# Patient Record
Sex: Female | Born: 1935
Health system: Southern US, Community
[De-identification: ages and names within clinical notes are randomized; demographics above are authoritative.]

## PROBLEM LIST (undated history)

## (undated) DIAGNOSIS — IMO0001 Reserved for inherently not codable concepts without codable children: Secondary | ICD-10-CM

## (undated) DIAGNOSIS — I10 Essential (primary) hypertension: Secondary | ICD-10-CM

## (undated) DIAGNOSIS — I2510 Atherosclerotic heart disease of native coronary artery without angina pectoris: Secondary | ICD-10-CM

## (undated) DIAGNOSIS — I739 Peripheral vascular disease, unspecified: Secondary | ICD-10-CM

## (undated) DIAGNOSIS — I6529 Occlusion and stenosis of unspecified carotid artery: Secondary | ICD-10-CM

## (undated) DIAGNOSIS — R05 Cough: Secondary | ICD-10-CM

## (undated) DIAGNOSIS — K5792 Diverticulitis of intestine, part unspecified, without perforation or abscess without bleeding: Secondary | ICD-10-CM

## (undated) DIAGNOSIS — R053 Chronic cough: Secondary | ICD-10-CM

## (undated) DIAGNOSIS — E079 Disorder of thyroid, unspecified: Secondary | ICD-10-CM

## (undated) DIAGNOSIS — M1990 Unspecified osteoarthritis, unspecified site: Secondary | ICD-10-CM

## (undated) DIAGNOSIS — E7800 Pure hypercholesterolemia, unspecified: Secondary | ICD-10-CM

## (undated) HISTORY — PX: CORONARY ARTERY BYPASS GRAFT: SHX141

## (undated) HISTORY — PX: ABDOMINAL HYSTERECTOMY: SHX81

## (undated) HISTORY — PX: COLONOSCOPY: SHX174

## (undated) HISTORY — PX: APPENDECTOMY: SHX54

## (undated) HISTORY — PX: EYE SURGERY: SHX253

---

## 1998-10-20 ENCOUNTER — Other Ambulatory Visit: Admission: RE | Admit: 1998-10-20 | Discharge: 1998-10-20 | Payer: Self-pay | Admitting: Family Medicine

## 1999-01-19 ENCOUNTER — Ambulatory Visit (HOSPITAL_COMMUNITY): Admission: RE | Admit: 1999-01-19 | Discharge: 1999-01-19 | Payer: Self-pay | Admitting: Gastroenterology

## 1999-02-09 ENCOUNTER — Encounter: Admission: RE | Admit: 1999-02-09 | Discharge: 1999-02-09 | Payer: Self-pay | Admitting: Family Medicine

## 1999-02-09 ENCOUNTER — Encounter: Payer: Self-pay | Admitting: Family Medicine

## 2000-02-16 ENCOUNTER — Encounter: Admission: RE | Admit: 2000-02-16 | Discharge: 2000-02-16 | Payer: Self-pay | Admitting: Family Medicine

## 2000-02-16 ENCOUNTER — Encounter: Payer: Self-pay | Admitting: Family Medicine

## 2000-07-19 ENCOUNTER — Encounter: Payer: Self-pay | Admitting: Cardiology

## 2000-07-19 ENCOUNTER — Ambulatory Visit (HOSPITAL_COMMUNITY): Admission: RE | Admit: 2000-07-19 | Discharge: 2000-07-19 | Payer: Self-pay | Admitting: Cardiology

## 2000-07-26 ENCOUNTER — Ambulatory Visit (HOSPITAL_COMMUNITY): Admission: RE | Admit: 2000-07-26 | Discharge: 2000-07-26 | Payer: Self-pay | Admitting: Cardiology

## 2000-08-06 ENCOUNTER — Encounter: Admission: RE | Admit: 2000-08-06 | Discharge: 2000-08-06 | Payer: Self-pay | Admitting: Family Medicine

## 2000-08-06 ENCOUNTER — Encounter: Payer: Self-pay | Admitting: Family Medicine

## 2000-08-12 ENCOUNTER — Encounter: Admission: RE | Admit: 2000-08-12 | Discharge: 2000-08-12 | Payer: Self-pay | Admitting: Family Medicine

## 2000-08-12 ENCOUNTER — Encounter: Payer: Self-pay | Admitting: Family Medicine

## 2001-02-18 ENCOUNTER — Encounter: Admission: RE | Admit: 2001-02-18 | Discharge: 2001-02-18 | Payer: Self-pay | Admitting: Family Medicine

## 2001-02-18 ENCOUNTER — Encounter: Payer: Self-pay | Admitting: Family Medicine

## 2002-02-24 ENCOUNTER — Ambulatory Visit (HOSPITAL_COMMUNITY): Admission: RE | Admit: 2002-02-24 | Discharge: 2002-02-24 | Payer: Self-pay | Admitting: Gastroenterology

## 2002-02-26 ENCOUNTER — Encounter: Payer: Self-pay | Admitting: Family Medicine

## 2002-02-26 ENCOUNTER — Encounter: Admission: RE | Admit: 2002-02-26 | Discharge: 2002-02-26 | Payer: Self-pay | Admitting: Family Medicine

## 2003-01-05 ENCOUNTER — Emergency Department (HOSPITAL_COMMUNITY): Admission: AD | Admit: 2003-01-05 | Discharge: 2003-01-05 | Payer: Self-pay | Admitting: Family Medicine

## 2003-04-28 ENCOUNTER — Encounter: Admission: RE | Admit: 2003-04-28 | Discharge: 2003-04-28 | Payer: Self-pay | Admitting: Family Medicine

## 2003-07-15 ENCOUNTER — Emergency Department (HOSPITAL_COMMUNITY): Admission: EM | Admit: 2003-07-15 | Discharge: 2003-07-15 | Payer: Self-pay | Admitting: Family Medicine

## 2004-05-16 ENCOUNTER — Encounter: Admission: RE | Admit: 2004-05-16 | Discharge: 2004-05-16 | Payer: Self-pay | Admitting: Family Medicine

## 2004-10-23 ENCOUNTER — Encounter: Admission: RE | Admit: 2004-10-23 | Discharge: 2004-10-23 | Payer: Self-pay | Admitting: Internal Medicine

## 2005-01-02 ENCOUNTER — Ambulatory Visit: Payer: Self-pay | Admitting: Internal Medicine

## 2005-01-03 ENCOUNTER — Ambulatory Visit (HOSPITAL_COMMUNITY): Admission: RE | Admit: 2005-01-03 | Discharge: 2005-01-03 | Payer: Self-pay | Admitting: Internal Medicine

## 2005-01-15 ENCOUNTER — Ambulatory Visit (HOSPITAL_COMMUNITY): Admission: RE | Admit: 2005-01-15 | Discharge: 2005-01-15 | Payer: Self-pay | Admitting: Internal Medicine

## 2005-01-18 ENCOUNTER — Ambulatory Visit (HOSPITAL_COMMUNITY): Admission: RE | Admit: 2005-01-18 | Discharge: 2005-01-18 | Payer: Self-pay | Admitting: Internal Medicine

## 2005-02-01 ENCOUNTER — Ambulatory Visit: Payer: Self-pay | Admitting: Internal Medicine

## 2005-03-09 ENCOUNTER — Ambulatory Visit: Payer: Self-pay | Admitting: Internal Medicine

## 2005-06-04 ENCOUNTER — Encounter: Admission: RE | Admit: 2005-06-04 | Discharge: 2005-06-04 | Payer: Self-pay | Admitting: Internal Medicine

## 2005-06-14 ENCOUNTER — Inpatient Hospital Stay (HOSPITAL_COMMUNITY): Admission: EM | Admit: 2005-06-14 | Discharge: 2005-06-25 | Payer: Self-pay | Admitting: Emergency Medicine

## 2005-06-15 ENCOUNTER — Encounter: Payer: Self-pay | Admitting: Vascular Surgery

## 2005-06-18 ENCOUNTER — Encounter (INDEPENDENT_AMBULATORY_CARE_PROVIDER_SITE_OTHER): Payer: Self-pay | Admitting: *Deleted

## 2005-07-06 ENCOUNTER — Encounter: Admission: RE | Admit: 2005-07-06 | Discharge: 2005-07-06 | Payer: Self-pay | Admitting: Cardiothoracic Surgery

## 2005-07-19 ENCOUNTER — Encounter (HOSPITAL_COMMUNITY): Admission: RE | Admit: 2005-07-19 | Discharge: 2005-10-17 | Payer: Self-pay | Admitting: Cardiology

## 2005-07-20 ENCOUNTER — Encounter: Admission: RE | Admit: 2005-07-20 | Discharge: 2005-07-20 | Payer: Self-pay | Admitting: Cardiothoracic Surgery

## 2005-08-16 ENCOUNTER — Encounter: Admission: RE | Admit: 2005-08-16 | Discharge: 2005-08-16 | Payer: Self-pay | Admitting: Cardiothoracic Surgery

## 2005-10-18 ENCOUNTER — Encounter (HOSPITAL_COMMUNITY): Admission: RE | Admit: 2005-10-18 | Discharge: 2005-11-02 | Payer: Self-pay | Admitting: Cardiology

## 2006-02-21 ENCOUNTER — Encounter (HOSPITAL_COMMUNITY): Admission: RE | Admit: 2006-02-21 | Discharge: 2006-05-22 | Payer: Self-pay | Admitting: Cardiology

## 2006-06-06 ENCOUNTER — Encounter (HOSPITAL_COMMUNITY): Admission: RE | Admit: 2006-06-06 | Discharge: 2006-09-04 | Payer: Self-pay | Admitting: Cardiology

## 2006-06-06 ENCOUNTER — Encounter: Admission: RE | Admit: 2006-06-06 | Discharge: 2006-06-06 | Payer: Self-pay | Admitting: Internal Medicine

## 2006-09-03 ENCOUNTER — Ambulatory Visit: Payer: Self-pay | Admitting: Internal Medicine

## 2006-09-06 ENCOUNTER — Encounter (HOSPITAL_COMMUNITY): Admission: RE | Admit: 2006-09-06 | Discharge: 2006-12-05 | Payer: Self-pay | Admitting: Cardiology

## 2006-12-07 ENCOUNTER — Encounter (HOSPITAL_COMMUNITY): Admission: RE | Admit: 2006-12-07 | Discharge: 2007-02-04 | Payer: Self-pay | Admitting: Cardiology

## 2007-02-06 ENCOUNTER — Encounter (HOSPITAL_COMMUNITY): Admission: RE | Admit: 2007-02-06 | Discharge: 2007-04-08 | Payer: Self-pay | Admitting: Cardiology

## 2007-06-10 ENCOUNTER — Encounter: Admission: RE | Admit: 2007-06-10 | Discharge: 2007-06-10 | Payer: Self-pay | Admitting: Internal Medicine

## 2008-06-10 ENCOUNTER — Encounter: Admission: RE | Admit: 2008-06-10 | Discharge: 2008-06-10 | Payer: Self-pay | Admitting: Internal Medicine

## 2009-06-13 ENCOUNTER — Encounter: Admission: RE | Admit: 2009-06-13 | Discharge: 2009-06-13 | Payer: Self-pay | Admitting: Internal Medicine

## 2010-02-26 ENCOUNTER — Encounter: Payer: Self-pay | Admitting: Internal Medicine

## 2010-05-12 ENCOUNTER — Other Ambulatory Visit: Payer: Self-pay | Admitting: Internal Medicine

## 2010-05-12 DIAGNOSIS — Z1231 Encounter for screening mammogram for malignant neoplasm of breast: Secondary | ICD-10-CM

## 2010-06-06 ENCOUNTER — Other Ambulatory Visit: Payer: Self-pay | Admitting: Gastroenterology

## 2010-06-15 ENCOUNTER — Ambulatory Visit
Admission: RE | Admit: 2010-06-15 | Discharge: 2010-06-15 | Disposition: A | Discharge status: Home / Self Care | Payer: Medicare Other | Source: Ambulatory Visit | Attending: Internal Medicine | Admitting: Internal Medicine

## 2010-06-15 DIAGNOSIS — Z1231 Encounter for screening mammogram for malignant neoplasm of breast: Secondary | ICD-10-CM

## 2010-06-20 NOTE — Assessment & Plan Note (Signed)
[facility]                             PULMONARY OFFICE NOTE   NAME:[name], [name]                   MRN:          [id number]  DATE:09/01/2006                            DOB:          [birth year]    PROBLEM LIST:  1. Chronic cough.  2. Asthmatic bronchitis.  3. Esophageal reflux.  4. Coronary disease.  5. Bypass.   HISTORY:  She is still going to rehab three times per week. Daily  morning cough productive of white phlegm for about 20 minutes. She said  she had this cough long before she started her Altace and it does not  sound, from that, as if it is likely to be an ACE inhibitor cough.  Barium swallow demonstrated pill dysphagia without reflux. She has a  history of a positive methacholine study for hyperreactive airways.  Spiriva had not helped her.   CURRENT MEDICATIONS:  1. Caduet 10/80.  2. Toprol XL 50 mg.  3. Zetia 10 mg.  4. Evista 60 mg.  5. Altace 10 mg.  6. Aspirin 81 mg.  7. Caltrate.  8. Multivitamins.   ALLERGIES:  None.   OBJECTIVE:  Weight 171, BP 128/58, pulse 79, room air saturation 92%.  Slight cough with deep inspiration, no real wheeze or rhonchi. No rales.  No neck vein distention or stridor. No visible drainage. Heart sounds  regular without murmur or gallop. No edema.   Pulmonary function tests have been done in December, 2006 demonstrating  mild obstructive disease of the small airway level and some response to  bronchodilators. Lung volumes are normal. Diffusion was mildly reduced  at that time. On a six minute walk test, she maintained oxygenation  walking 1300 feet.   IMPRESSION:  1. Cough, very likely is related to her swallowing low grade      aspiration and probably occasional reflux and also related to some      mildly reactive airway disease. She doesn't seem troubled enough to      feel it is worth while to change her ACE inhibitor based on her      history.   PLAN:  1. We are going to have  her try Symbicort 160/4.5 two puffs b.i.d.      with instructions.  2. Schedule to return in three months, earlier prn.     Clinton D. Maple Hudson, MD, Tonny Bollman, FACP  Electronically Signed    CDY/MedQ  DD: 09/03/2006  DT: 09/04/2006  Job #: [id number]

## 2010-06-23 NOTE — Consult Note (Signed)
[name], [name]            ACCOUNT NO.:  [id number]   MEDICAL RECORD NO.:  [id number]          PATIENT TYPE:  INP   LOCATION:  2006                         FACILITY:  [facility]   PHYSICIAN:  Kerin Perna, M.D.  DATE OF BIRTH:  04/17/1933   DATE OF CONSULTATION:  06/15/2005  DATE OF DISCHARGE:                                   CONSULTATION   REASON FOR CONSULTATION:  Left main and three vessel coronary artery disease  with unstable angina.   CHIEF COMPLAINT:  Chest pain.   HISTORY OF PRESENT ILLNESS:  I was asked to evaluate this 77 year old obese  white female with hypertension for potential surgical coronary  revascularization for recently diagnosed severe three vessel coronary artery  disease with left main stenosis.  The patient has a history of coronary  artery disease and had a heart cath 10-12 years ago which demonstrated an  occluded right coronary.  She was treated medically and had no significant  LV dysfunction.  She did well until recently when she developed symptoms of  exertional and depressing chest pain and exertional shortness of breath.  She was having some radiation of this pain to her shoulders with associated  nausea and light headedness, and presented for evaluation.  Her EKG showed  some mild ST segment depression and her cardiac enzymes were negative.  She  was admitted, placed on heparin and nitroglycerin, and underwent urgent  cardiac catheterization by Dr. Jenne Campus.  This demonstrated a chronically  occluded right coronary artery and an ostial left main stenosis of 70% with  calcification.  Left ventricular end diastolic pressure was 19 and her  ejection fraction was normal.  There was no evidence of mitral regurgitation  or aortic stenosis.  Because of her coronary anatomy and symptoms, she was  admitted and felt to be a candidate for surgical revascularization of her  coronary artery disease.  The patient is currently stable on IV heparin and  not  having chest pain.   PAST MEDICAL HISTORY:  1.  Hypertension.  2.  Asthma and COPD.  3.  Hyperlipidemia.  4.  Bilateral carotid bruits with a right ICA stenosis of 60%.  5.  Recent onset of eczema of her left arm and leg treated with prednisone      taper.  6.  Three weeks of diarrhea, history of diverticular disease of the colon.  7.  Surgical history positive for total abdominal hysterectomy.  8.  No known drug allergies.   CURRENT MEDICATIONS:  E-Vista 60 mg daily, Imdur 120 mg daily, Caltrate D  one p.o. daily, aspirin 325 daily, multi-vitamin one daily, Toprol XL 50 mg  daily, Zetia 10 mg daily, Caduet 10/80 mg daily, and a prednisone pack.   ALLERGIES:  None.   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  She is married and has children.  She never smoked or used alcohol.   FAMILY HISTORY:  No history of premature coronary artery disease or  diabetes.   REVIEW OF SYMPTOMS:  Constitutional review is negative for fever or weight  loss.  ENT  review is negative for headache, change in vision, active dental  disease, or difficulty swallowing.  She was evaluated with an esophagogram  for GERD and that was negative.  Cardiac review is positive for coronary  artery disease and unstable angina with three vessel disease and unstable  angina.  Pulmonary review is positive for chronic cough for ten years  evaluated by multiple pulmonary specialists of unclear etiology.  This is a  non-productive cough.  GI review is positive for her pre-disposition to  diarrhea after colonoscopy.  She has had polypectomy by Dr. Carman Ching.  She does have notation of diverticular disease of the colon in her medical  record.  She states one out of four recent stool specimens were positive for  blood, mild.  She does have a history of hemorrhoid disease.  Urologic  review is negative for kidney stones or UTI.  Vascular review is negative  for claudication, TIA, or DVT.   Her Doppler carotid exams shows a 60%  moderate right carotid stenosis.  Neurologic review is negative for stroke  or seizure.  Hematologic review is negative for bleeding disorder or blood  transfusions.  Endocrine Review is negative for diabetes or thyroid disease.  Skin review is positive for a recent rash of the arm and leg which appeared  to be eczema which responded to a prednisone taper.  She does have a history  of Herpes zoster, as well, according to her history.   PHYSICAL EXAMINATION:  VITAL SIGNS:  The patient is 5 feet 8 inches, weight 174 pounds, blood  pressure 130/80, pulse 80, respirations 18, saturation 96% on room air.  GENERAL:  Short, obese white female in no acute distress.  HEENT:  Normocephalic, dentition adequate.  NECK:  Bilateral carotid bruits but no masses or JVD.  LYMPHATICS:  Reveal no palpable supraclavicular or axillary adenopathy.  LUNGS:  Clear.  CARDIAC EXAM:  Regular rhythm without S3 gallop, murmur, or rub.  ABDOMEN:  Soft without organomegaly or focal tenderness.  There were no  abdominal bruits present.  EXTREMITIES:  Trace edema, no clubbing or cyanosis.  VASCULAR EXAM:  2+ pulses in the radial and femoral areas, not palpable in  the pedal areas bilaterally.  SKIN:  Raised, red dry rash on the left arm and left thigh.  NEUROLOGIC:  Alert and oriented x 3 without focal motor deficit.   IMPRESSION:  I reviewed the coronary arteriograms and her hospital record.  She has significant three vessel coronary artery disease with preserved LV  systolic function.  The patient would benefit from surgical  revascularization with mammary artery graft to the LAD and vein grafts to  the right coronary, posterior descending, and circumflex marginal.  I have  discussed the procedure in detail with the patient and family and they  understand  the indications, associated risks, as well as the alternatives to surgery. The surgery will be tentatively scheduled for  Tuesday, May 15.   Thank you for the consultation.      Kerin Perna, M.D.  Electronically Signed     PV/MEDQ  D:  06/15/2005  T:  06/15/2005  Job:  [id number]   cc:   Darlin Priestly, MD  Fax: [phone]   Cristy Hilts. Jacinto Halim, MD  Fax: [phone]   Olene Craven, M.D.  Fax: [phone]

## 2010-06-23 NOTE — Op Note (Signed)
   NAME:  [name], [name]                      ACCOUNT NO.:  [id number]   MEDICAL RECORD NO.:  [id number]                   PATIENT TYPE:  AMB   LOCATION:  ENDO                                 FACILITY:  [facility]   PHYSICIAN:  James L. Malon Kindle., M.D.          DATE OF BIRTH:  [birth year]   DATE OF PROCEDURE:  02/24/2002  DATE OF DISCHARGE:                                 OPERATIVE REPORT   PROCEDURE:  Colonoscopy.   MEDICATIONS:  1. Fentanyl 100 mcg.  2. Versed 10 mg I.V.   INDICATIONS:  The patient has had a previous history of polyps removed.  This is done as a follow-up.   DESCRIPTION OF PROCEDURE:  The procedure had been explained to the patient  and consent obtained.  The patient was placed in the left lateral decubitus  position.  The Olympus pediatric scope was inserted and advanced.  The prep  was good.  She had a long, tortuous colon.  Multiple maneuvers were  required.  We eventually were able to advance to the level of the ileocecal  valve.  The ileocecal valve did appear to be lipomatous.  The tip of the  cecum could be seen in the distance.  The scope was withdrawn and the  ascending colon, transverse colon, splenic flexure, descending and sigmoid  colon were seen well.  Marked diverticular disease in the sigmoid and left  colon.  No polyps were seen.  Scope was withdrawn back into the rectum.  There were no polyps seen in the rectum.  There were internal hemorrhoids  and a small amount of bright blood at the anal canal.  The scope was  withdrawn.  The patient tolerated the procedure well, was monitored  throughout, and given O2 by nasal cannula.                                               James L. Malon Kindle., M.D.    Waldron Session  D:  02/24/2002  T:  02/24/2002  Job:  [id number]   cc:   L. Lupe Carney, M.D.  [address] [place]  [place] [address]  Fax: [phone]

## 2010-06-23 NOTE — Discharge Summary (Signed)
NAMECRISSA, SOWDER            ACCOUNT NO.:  [id number]   MEDICAL RECORD NO.:  [id number]          PATIENT TYPE:  INP   LOCATION:  2021                         FACILITY:  [facility]   PHYSICIAN:  Kerin Perna, M.D.  DATE OF BIRTH:  Mar 10, 1933   DATE OF ADMISSION:  06/14/2005  DATE OF DISCHARGE:  06/25/2005                                 DISCHARGE SUMMARY   PRIMARY ADMITTING DIAGNOSIS:  Chest pain   ADDITIONAL/DISCHARGE DIAGNOSES:  1.  Severe three-vessel coronary artery disease.  2.  Unstable angina.  3.  Hypertension.  4.  Asthma/chronic obstructive pulmonary disease.  5.  Hyperlipidemia.  6.  Extracranial cerebrovascular occlusive disease with a 60-80% right      internal carotid artery stenosis.  7.  History of diverticular disease.  8.  Postoperative blood loss anemia.   PROCEDURES PERFORMED:  1.  Cardiac catheterization.  2.  Coronary artery bypass grafting x3 (left internal mammary artery to the      LAD, saphenous vein graft to the obtuse marginal, saphenous vein graft      to posterior descending.  3.  Endoscopic vein harvest left leg.   HISTORY:  The patient is a 77 year old white female with a known history of  coronary artery disease.  She presented to the [facility] on the date of this admission complaining of chest pain  which had been occurring intermittently since the previous night.  It was  felt that she should be admitted and undergo cardiac catheterization to  further delineate her anatomy at this time.   HOSPITAL COURSE:  She was admitted and started on IV nitroglycerin and  heparin.  She underwent cardiac catheterization which showed a new ostial  left main stenosis along with chronic occlusion of the right coronary  artery, left ventricular systolic function was preserved.  Her cardiac  enzymes were negative.  She was not felt to be a good candidate for  percutaneous intervention.  A cardiothoracic surgery consultation  was  obtained and the patient was seen by Dr. Kathlee Nations Trigt who agreed that her  best course of action would be to proceed with surgical revascularization.  He explained the risks, benefits and alternatives of the procedure to the  patient and she agreed to proceed with surgery.  She underwent a complete  preoperative workup which included carotid Doppler study showing a 60 to 80%  right internal carotid artery stenosis and lower extremity Dopplers which  showed slightly diminished ABIs bilaterally at 0.77 on the right and 0.83 on  the left.  She remained stable and pain-free prior to surgery.  She was  taken to the operating room on Jun 19, 2005, and underwent CABG x3 as  described in detail above.  She tolerated the procedure well and was  transferred to the SICU in stable condition.  She was able to be extubated  shortly after surgery.   She was hemodynamically stable and doing well on postop day #1.  At that  time, her chest tubes and hemodynamic monitoring devices were removed.  She  was mobilized with the assistance of  cardiac rehab phase one.   By postop day #2, she was doing well and able to be transferred to the  floor.  Postoperatively, she has been volume overloaded and has been  diuresed.  She also has had a mild acute blood loss anemia and has been  treated with iron and folic acid supplements and is improving.  She has been  slightly tachycardiac and has been started on a beta blocker which has been  titrated upward.  Also her blood pressure started to trend upward and she  was started on an ACE inhibitor.  Her pulmonary status has remained stable.  She has been treated with aggressive pulmonary toilet measures, metered dose  inhaler and incentive spirometry.  She is tolerating a regular diet and is  having normal bowel and bladder function.  She has been ambulating in the  halls without difficulty.  She is afebrile and her vital signs are stable.  She is maintaining O2  sats of greater than 90% on room air.  She is still  approximately 8 pounds above her preoperative weight and still exhibit signs  of lower extremity edema on physical exam.   Her most recent labs show hemoglobin of 8.6, hematocrit 25.3, white count  11.4, platelets 176.  Sodium 138, potassium 4.2, BUN 13, creatinine 0.8.   Her incisions are all healing well.  It is felt that if she continues to  remain stable over the next 24 hours and her labs to be drawn on Jun 25, 2005, specifically CBC and a BMET, are stable and she is otherwise doing  well, she will hopefully be ready for discharge home at that time.   DISCHARGE MEDICATIONS:  1.  Aspirin 81 mg daily.  2.  Lopressor 50 mg b.i.d.  3.  Altace 5 mg daily.  4.  Caduet 10/80 daily.  5.  Lasix 40 mg daily x5 days.  6.  K-Dur 20 mEq daily x5 days.  7.  Folic acid 1 mg daily.  8.  Nu-Iron 150 mg daily.  9.  Advair 250/50 1 puff b.i.d.  10. Evista 60 mg daily.  11. Multivitamin daily.  12. Calcium daily.  13. Zetia 10 mg daily.  14. Oxycodone 5 mg one to two q.4h. p.r.n. for pain.   DISCHARGE INSTRUCTIONS:  She is asked to refrain from driving, heavy lifting  or strenuous activity.  She may continue ambulating daily and using her  incentive spirometer.  She may shower daily and clean her incisions with  soap and water.  She will continue low-fat, low-sodium diet.   DISCHARGE FOLLOWUP:  She will need to make an appointment see Dr. Jacinto Halim back  in two weeks and will call for an appointment.  She will see Dr. Donata Clay  the following week and will have a chest x-ray at [facility]  prior to this appointment.  She is asked to contact our office immediately  if she experiences any problems or has questions.      Coral Ceo, P.A.      Kerin Perna, M.D.  Electronically Signed    GC/MEDQ  D:  06/24/2005  T:  06/25/2005  Job:  [id number]  cc:   Olene Craven, M.D.  Fax: [phone]   Cristy Hilts. Jacinto Halim, MD  Fax:  [phone]

## 2010-06-23 NOTE — Op Note (Signed)
[facility]  Patient:    [name]                    MRN: [id number] Proc. Date: 01/19/99 Adm. Date:  [id number] Disc. Date: 01/19/99 Attending:  Orland Mustard CC:         Abran Cantor. Clovis Riley, MD                           Operative Report  PROCEDURE:  Colonoscopy with polypectomy.  MEDICATIONS:  Fentanyl 100 mcg, Versed 10 mg IV.  SCOPE:  Olympus adult video colonoscope.  DESCRIPTION OF PROCEDURE:  The procedure had been explained to the patient and consent obtained.  With the patient in the left lateral decubitus position the Olympus adult video colonoscope was inserted and advanced under direct visualization.  The prep was excellent.  The patient had marked diverticular disease and it took some time to pass the sigmoid colon, and position changes and abdominal pressure were required to reach cecum. We arrived at the level of ileocecal valve.  The tip of the cecum could be seen in the distance, although  could not entirely advance in to; it appeared normal.  The scope was withdrawn nto the cecum and ascending colon, hepatic flexure, transverse colon and splenic flexure were seen well.  Descending colon was also seen well and was free of neoplastic lesions.  Marked diverticular disease in the sigmoid colon.  The scope was withdrawn down into the rectum, into the proximal rectum.  At approximately  15-18 cm a .75 cm polyp was snared and sucked up against the scope and removed.  There was no bleeding.  The scope was reinserted and the scope decompressed. The patient tolerated the procedure well, maintaining a low-flow oxygen with pulse oximetry without seeing obvious problem.  ASSESSMENT: 1. Rectal polyp removed. 2. Diverticulosis.  PLAN:  Recommend repeating procedure in three years. DD:  01/19/99 TD:  01/19/99 Job: [id number]

## 2010-06-23 NOTE — Op Note (Signed)
[name], [name]            ACCOUNT NO.:  [id number]   MEDICAL RECORD NO.:  [id number]          PATIENT TYPE:  INP   LOCATION:  2310                         FACILITY:  [facility]   PHYSICIAN:  Kerin Perna, M.D.  DATE OF BIRTH:  April 15, 1933   DATE OF PROCEDURE:  06/19/2005  DATE OF DISCHARGE:                                 OPERATIVE REPORT   OPERATION:  Coronary artery bypass grafting x3 (left internal mammary artery  to the left anterior descending artery, saphenous vein graft to obtuse  marginal, saphenous vein graft to posterior descending).   PREOPERATIVE DIAGNOSIS:  Class IV unstable angina with left main and three-  vessel coronary disease.   POSTOPERATIVE DIAGNOSIS:  Class IV unstable angina with left main and three-  vessel coronary disease.   SURGEON:  Kerin Perna, M.D.   ASSISTANT:  Constance Holster, P.A.-C.   ANESTHESIA:  General by Dr. Jairo Ben.   MEDICATIONS:  The patient is a 77 year old obese female who presented with  unstable angina.  She has known coronary disease and by cardiac  catheterization 10 years ago was found to have occlusion of the right  coronary with reconstitution of the posterior descending via collaterals.  Her cardiac enzymes were negative, but cardiac catheterization demonstrated  new ostial left main stenosis along with her chronic occlusion of the right  coronary.  LV systolic function was preserved.  She was felt to be candidate  for surgical revascularization.   Prior to surgery I examined the patient in her hospital room and reviewed  the results of the cardiac catheterization with the patient and family.  I  discussed the indications and expected benefits of coronary artery bypass  surgery.  I reviewed the alternatives to surgical therapy as well.  I  discussed with the patient the major aspects of planned operation including  location of the surgical incisions, use of general anesthesia and  cardiopulmonary bypass,  the choice of conduit for grafting, and the expected  postoperative hospital recovery period.  I discussed with the patient the  direct risks to her of coronary bypass surgery including the risks of MI,  CVA, bleeding, infection, blood transfusion requirement, and death.  She  understood these implications for the surgery and agreed to proceed with the  operation as planned under what I felt was an informed consent after several  discussions with the patient.   OPERATIVE FINDINGS:  The patient's coronaries were embedded in a deep layer  of fat.  The vein was harvested endoscopically from the right leg and was of  adequate quality.  The mammary artery was a good vessel with excellent flow.  The patient was given Amicar and was not given aprotinin for this operation.  She did not receive any blood cell transfusion but did receive a unit of  platelets after reversal of the heparin with protamine due to persistent  coagulopathy.   PROCEDURE:  The patient was brought to operating room and placed supine on  the operating table and anterior general anesthesia was induced under  invasive hemodynamic monitoring.  The chest, abdomen and legs were prepped  with Betadine and draped as a sterile field.  A sternal incision was made as  the saphenous vein was harvested endoscopically from the right leg.  The  left internal mammary artery was harvested as a pedicle graft from its  origin at the subclavian vessels.  It was good vessel with excellent flow.  Heparin was administered systemically and the ACT was documented as being  therapeutic.  The sternal retractor was placed and the pericardium was  opened and suspended.  Pursestrings were placed in the ascending aorta and  right atrium and the patient was cannulated and placed on bypass.  The  coronaries were identified for grafting and the mammary artery and vein  grafts were prepared for the distal anastomoses.  Cardioplegia catheters  were placed  for both antegrade aortic and retrograde coronary sinus  cardioplegia.  The patient was cooled to 32 degrees.  The aortic crossclamp  was applied.  Cold blood cardioplegia 800 mL was delivered in split doses  between the antegrade aortic and retrograde coronary sinus catheters.  There  was a good cardioplegic arrest and septal temperature dropped to less than  12 degrees.  Topical iced saline was used to augment myocardial  preservation.   The distal coronary anastomoses were performed.  The first distal  anastomosis was to the posterior descending.  The anastomosis was right at  the takeoff of the posterior descending from the main branch of right  coronary, which had been totally occluded.  It was a 1.5 mm vessel, and a  reversed saphenous vein was sewn end-to-side with running 7-0 Prolene.  There was good flow through the graft.  The second distal anastomosis was to  the circumflex marginal.  This was a 1.5 mm vessel proximal 70% left main  stenosis.  A reversed saphenous vein was sewn end-to-side with running 7-0  Prolene, and there was excellent flow through graft.  Cardioplegia was  redosed.  The third distal anastomosis was to the mid-LAD.  This was a 1.75  mm vessel with proximal 70% left main stenosis.  The left IMA pedicle was  brought through an opening created in the left lateral pericardium and was  brought down onto the LAD and sewn end-to-side with running 8-0 Prolene.  There was excellent flow through the anastomosis after briefly releasing the  pedicle bulldog on the mammary artery.  The mammary pedicle bulldog was  reapplied and the pedicle secured to the epicardium.  Cardioplegia was  redosed.   While the crossclamp was still in place, two proximal vein anastomoses were  placed on the ascending aorta using a 4.0-mm punch and running 6-0 Prolene.  Prior to tying down the final proximal anastomosis, air was vented from the coronaries and the left side of the heart using  a dose of retrograde warm  blood cardioplegia and the usual de-airing maneuvers on bypass.  The  proximals were tied down and the crossclamp was removed.   The heart resumed a spontaneous rhythm.  The proximal and distal anastomoses  were checked and found to be hemostatic.  Air was aspirated from the vein  grafts with a 27-gauge needle.  The patient was rewarmed and reperfused.  Temporary pacing wires were applied.  When the patient reached 37 degrees,  the lungs re-expanded and the ventilator was resumed.  The patient was  weaned from bypass without difficulty.  Cardiac output, rhythm and blood  pressure were stable.  Protamine was administered without adverse reaction.  The cannulas were removed.  The mediastinum was irrigated with warm  antibiotic irrigation.  The leg incision was irrigated and closed in a  standard fashion.  Because of persistent coagulopathy, the patient was given  a unit of platelets as the platelet count on bypass was a approximately  120,000.  This improved coagulation function somewhat, and a second dose of  Amicar was given.  The superior pericardial fat was closed over the aorta  and vein grafts.  Two mediastinal and a left pleural chest tube were placed  and brought out through separate incisions.  The sternum was closed with  interrupted steel wire.  The pectoralis fascia was closed in a running #1  Vicryl.  The subcutaneous and skin layers were closed in a running Vicryl  and sterile dressings were applied.  Total bypass time was 105 minutes with  crossclamp time of 70 minutes.      Kerin Perna, M.D.  Electronically Signed     PV/MEDQ  D:  06/19/2005  T:  06/20/2005  Job:  [id number]

## 2010-06-23 NOTE — Cardiovascular Report (Signed)
[name], [name]            ACCOUNT NO.:  [id number]   MEDICAL RECORD NO.:  [id number]          PATIENT TYPE:  INP   LOCATION:  2006                         FACILITY:  [facility]   PHYSICIAN:  Darlin Priestly, MD  DATE OF BIRTH:  Aug 19, 1933   DATE OF PROCEDURE:  06/15/2005  DATE OF DISCHARGE:                              CARDIAC CATHETERIZATION   PROCEDURE:  1.  Left heart catheterization.  2.  Coronary angiography.  3.  Left ventriculogram.   ATTENDING PHYSICIAN:  Darlin Priestly, MD.   COMPLICATIONS:  None.   INDICATIONS FOR PROCEDURE:  [name] is a 77 year old female patient of  Vonna Kotyk R. Jacinto Halim, MD with a history of CAD with a chronically occluded RCA with  left to right collaterals. She was admitted on Jun 14, 2005 with complaint  of increasing chest pain with lateral EKG changes. She is now referred for  repeat catheterization to reassess her current anatomy.   DESCRIPTION OF PROCEDURE:  After informed consent, the patient was brought  to the cardiac cath lab, right and left groin shaved, prepped and draped in  the usual sterile fashion. ECG monitor established. Using the modified  Seldinger technique, a #6 French arterial sheath was inserted in the right  femoral artery. A 6 French diagnostic catheter was used to perform a  diagnostic angiography.   The left main is calcified with a 70% ostial/proximal lesion. There is  pressure damping when engaged. The patient also develops marked bradycardia  and relative hypotension with coronary injection.   The LAD is a medium to large size vessel which course _____ 1 diagonal  branch. There is mild calcification in the mid portion of the LAD; however,  there does not appear to be any significant stenosis.   The first diagonal is a medium size vessel with no evidence of disease.   The left circumflex is a medium size vessel coursing _____ 1 obtuse marginal  branch. _____ circumflex noted to have 50% in the vessel  lesion.   The first OM is a medium size vessel with no evidence of disease.   There is left to right collaterals to the distal PDA, PLA and distal RCA  with a 70% mid RCA stenotic lesion.   The RCA is totally occluded in its proximal segment.   Left ventriculogram revealed some preserved EF of 60%.   HEMODYNAMIC SYSTEM:  _____ pressure 146/72, LV pressure 140/12, LVDP of 19.   CONCLUSION:  1.  Significant left main and one-vessel coronary artery disease.  2.  Normal left ventricular systolic function.  3.  Elevated left ventricular diastolic pressure.      Darlin Priestly, MD  Electronically Signed     RHM/MEDQ  D:  06/15/2005  T:  06/16/2005  Job:  [id number]   cc:   Cristy Hilts. Jacinto Halim, MD  Fax: [phone]

## 2011-03-14 DIAGNOSIS — Z951 Presence of aortocoronary bypass graft: Secondary | ICD-10-CM | POA: Diagnosis not present

## 2011-03-14 DIAGNOSIS — I2510 Atherosclerotic heart disease of native coronary artery without angina pectoris: Secondary | ICD-10-CM | POA: Diagnosis not present

## 2011-03-14 DIAGNOSIS — E782 Mixed hyperlipidemia: Secondary | ICD-10-CM | POA: Diagnosis not present

## 2011-03-14 DIAGNOSIS — I6529 Occlusion and stenosis of unspecified carotid artery: Secondary | ICD-10-CM | POA: Diagnosis not present

## 2011-05-08 ENCOUNTER — Other Ambulatory Visit: Payer: Self-pay | Admitting: Internal Medicine

## 2011-05-08 DIAGNOSIS — Z1231 Encounter for screening mammogram for malignant neoplasm of breast: Secondary | ICD-10-CM

## 2011-05-14 DIAGNOSIS — L219 Seborrheic dermatitis, unspecified: Secondary | ICD-10-CM | POA: Diagnosis not present

## 2011-06-07 DIAGNOSIS — E7800 Pure hypercholesterolemia, unspecified: Secondary | ICD-10-CM | POA: Diagnosis not present

## 2011-06-07 DIAGNOSIS — I10 Essential (primary) hypertension: Secondary | ICD-10-CM | POA: Diagnosis not present

## 2011-06-11 ENCOUNTER — Emergency Department (HOSPITAL_COMMUNITY)
Admission: EM | Admit: 2011-06-11 | Discharge: 2011-06-12 | Disposition: A | Discharge status: Home / Self Care | Payer: Medicare Other | Attending: Emergency Medicine | Admitting: Emergency Medicine

## 2011-06-11 ENCOUNTER — Emergency Department (HOSPITAL_COMMUNITY): Payer: Medicare Other

## 2011-06-11 ENCOUNTER — Encounter (HOSPITAL_COMMUNITY): Payer: Self-pay | Admitting: Emergency Medicine

## 2011-06-11 DIAGNOSIS — E7800 Pure hypercholesterolemia, unspecified: Secondary | ICD-10-CM | POA: Diagnosis not present

## 2011-06-11 DIAGNOSIS — Y92009 Unspecified place in unspecified non-institutional (private) residence as the place of occurrence of the external cause: Secondary | ICD-10-CM | POA: Insufficient documentation

## 2011-06-11 DIAGNOSIS — S9000XA Contusion of unspecified ankle, initial encounter: Secondary | ICD-10-CM | POA: Diagnosis not present

## 2011-06-11 DIAGNOSIS — M25579 Pain in unspecified ankle and joints of unspecified foot: Secondary | ICD-10-CM | POA: Insufficient documentation

## 2011-06-11 DIAGNOSIS — I2510 Atherosclerotic heart disease of native coronary artery without angina pectoris: Secondary | ICD-10-CM | POA: Insufficient documentation

## 2011-06-11 DIAGNOSIS — M25476 Effusion, unspecified foot: Secondary | ICD-10-CM | POA: Insufficient documentation

## 2011-06-11 DIAGNOSIS — IMO0002 Reserved for concepts with insufficient information to code with codable children: Secondary | ICD-10-CM | POA: Diagnosis not present

## 2011-06-11 DIAGNOSIS — M25473 Effusion, unspecified ankle: Secondary | ICD-10-CM | POA: Diagnosis not present

## 2011-06-11 DIAGNOSIS — S82853A Displaced trimalleolar fracture of unspecified lower leg, initial encounter for closed fracture: Secondary | ICD-10-CM | POA: Insufficient documentation

## 2011-06-11 DIAGNOSIS — W010XXA Fall on same level from slipping, tripping and stumbling without subsequent striking against object, initial encounter: Secondary | ICD-10-CM | POA: Insufficient documentation

## 2011-06-11 HISTORY — DX: Pure hypercholesterolemia, unspecified: E78.00

## 2011-06-11 HISTORY — DX: Atherosclerotic heart disease of native coronary artery without angina pectoris: I25.10

## 2011-06-11 LAB — CBC
HCT: 41.6 % (ref 36.0–46.0)
Hemoglobin: 14.7 g/dL (ref 12.0–15.0)
MCH: 34.4 pg — ABNORMAL HIGH (ref 26.0–34.0)
MCHC: 35.3 g/dL (ref 30.0–36.0)
MCV: 97.4 fL (ref 78.0–100.0)
Platelets: 225 10*3/uL (ref 150–400)
RBC: 4.27 MIL/uL (ref 3.87–5.11)
RDW: 12.4 % (ref 11.5–15.5)
WBC: 10.8 10*3/uL — ABNORMAL HIGH (ref 4.0–10.5)

## 2011-06-11 LAB — DIFFERENTIAL
Basophils Absolute: 0 10*3/uL (ref 0.0–0.1)
Basophils Relative: 0 % (ref 0–1)
Eosinophils Absolute: 0.1 10*3/uL (ref 0.0–0.7)
Eosinophils Relative: 1 % (ref 0–5)
Lymphocytes Relative: 27 % (ref 12–46)
Lymphs Abs: 2.9 10*3/uL (ref 0.7–4.0)
Monocytes Absolute: 0.7 10*3/uL (ref 0.1–1.0)
Monocytes Relative: 7 % (ref 3–12)
Neutro Abs: 7.1 10*3/uL (ref 1.7–7.7)
Neutrophils Relative %: 66 % (ref 43–77)

## 2011-06-11 MED ORDER — HYDROMORPHONE HCL PF 1 MG/ML IJ SOLN
1.0000 mg | Freq: Once | INTRAMUSCULAR | Status: AC
  Administered 2011-06-11: 1 mg via INTRAVENOUS
  Filled 2011-06-11: qty 1

## 2011-06-11 MED ORDER — ONDANSETRON HCL 4 MG/2ML IJ SOLN
4.0000 mg | Freq: Once | INTRAMUSCULAR | Status: AC
  Administered 2011-06-11: 4 mg via INTRAVENOUS
  Filled 2011-06-11: qty 2

## 2011-06-11 NOTE — ED Provider Notes (Signed)
History     CSN: [id number]  Arrival date & time 06/11/11  1919   First MD Initiated Contact with Patient 06/11/11 2140      Chief Complaint  Patient presents with  . Ankle Pain    (Consider location/radiation/quality/duration/timing/severity/associated sxs/prior treatment) HPI Comments: Patient here after slipping on the bottom step and twisting her left ankle. States left ankle pain and abrasion to left elbow without deformity to the elbow. Marked deformity to left ankle with swelling and bruising noted medially and laterally. Denies numbness or tingling distal to the injury. She is unable to bear weight.  Patient is a 78 y.o. female presenting with ankle pain. The history is provided by the patient. No language interpreter was used.  Ankle Pain  The incident occurred 1 to 2 hours ago. The incident occurred at home. The injury mechanism was a fall and torsion. The pain is present in the left ankle. The quality of the pain is described as throbbing. The pain is at a severity of 9/10. The pain is severe. The pain has been constant since onset. Associated symptoms include inability to bear weight and loss of motion. Pertinent negatives include no numbness, no muscle weakness, no loss of sensation and no tingling. She reports no foreign bodies present. The symptoms are aggravated by bearing weight. She has tried ice for the symptoms. The treatment provided no relief.    Past Medical History  Diagnosis Date  . Coronary artery disease   . Hypercholesteremia     Past Surgical History  Procedure Date  . Coronary artery bypass graft     No family history on file.  History  Substance Use Topics  . Smoking status: Never Smoker   . Smokeless tobacco: Not on file  . Alcohol Use: No    OB History    Grav Para Term Preterm Abortions TAB SAB Ect Mult Living                  Review of Systems  Musculoskeletal: Positive for joint swelling and arthralgias.  Neurological: Negative for  tingling and numbness.  All other systems reviewed and are negative.    Allergies  Review of patient's allergies indicates no known allergies.  Home Medications   Current Outpatient Rx  Name Route Sig Dispense Refill  . ALBUTEROL SULFATE HFA 108 (90 BASE) MCG/ACT IN AERS Inhalation Inhale 2 puffs into the lungs every 6 (six) hours as needed. For wheezing/cough    . VITAMIN D PO Oral Take 1 capsule by mouth daily.    . ADULT MULTIVITAMIN W/MINERALS CH Oral Take 1 tablet by mouth daily.      BP 135/63  Pulse 71  Temp(Src) 98.4 F (36.9 C) (Oral)  Resp 18  SpO2 93%  Physical Exam  Nursing note and vitals reviewed. Constitutional: She is oriented to person, place, and time. She appears well-developed and well-nourished.       Uncomfortable  HENT:  Head: Normocephalic and atraumatic.  Right Ear: External ear normal.  Left Ear: External ear normal.  Nose: Nose normal.  Mouth/Throat: Oropharynx is clear and moist. No oropharyngeal exudate.  Eyes: Conjunctivae are normal. Pupils are equal, round, and reactive to light. No scleral icterus.  Neck: Normal range of motion. Neck supple.  Cardiovascular: Normal rate, regular rhythm and normal heart sounds.  Exam reveals no gallop and no friction rub.   No murmur heard. Pulmonary/Chest: Effort normal and breath sounds normal. No respiratory distress. She has no wheezes. She has  no rales. She exhibits no tenderness.  Abdominal: Soft. Bowel sounds are normal. She exhibits no distension. There is no tenderness.  Musculoskeletal:       Left ankle: She exhibits decreased range of motion, swelling, ecchymosis and deformity. tenderness. Lateral malleolus and medial malleolus tenderness found. Achilles tendon normal.       Feet:       Dorsalis pedis and posterior tibial pulses intact. Sensation intact distally  Lymphadenopathy:    She has no cervical adenopathy.  Neurological: She is alert and oriented to person, place, and time. No cranial  nerve deficit. She exhibits normal muscle tone. Coordination normal.  Skin: Skin is warm and dry. No rash noted. No erythema. No pallor.  Psychiatric: She has a normal mood and affect. Her behavior is normal. Judgment and thought content normal.    ED Course  Procedures (including critical care time)   Labs Reviewed  CBC  DIFFERENTIAL   Dg Ankle Complete Left  06/11/2011  *RADIOLOGY REPORT*  Clinical Data: 78 year old female status post fall with pain and swelling.  LEFT ANKLE COMPLETE - 3+ VIEW  Comparison: None.  Findings: Trimalleolar fracture.  Mildly displaced medial malleolus component.  Comminuted mild laterally displaced distal fibula component.  Mildly impacted posterior malleolar component.  Mild posterior and lateral mortise joint subluxation.  Talar dome intact.  Calcaneus appears intact.  IMPRESSION: Trimalleolar fracture with mild posterior and lateral mortise joint subluxation as detailed above.  Original Report Authenticated By: Harley Hallmark, M.D.     Trimalleolar left ankle fracture    MDM  Patient seen with Dr. Ignacia Palma. Here with left ankle fracture. Mild posterior and lateral mortise joint subluxation. Dr. Ignacia Palma spoke with Dr. Eulah Pont who recommends placing the patient in a posterior splint and walker and he will see the patient in the office tomorrow.        Izola Price [place], [place] 06/12/11 [phone]

## 2011-06-11 NOTE — ED Notes (Signed)
Labs drawn from IV site and handed to phlebotomist

## 2011-06-11 NOTE — ED Provider Notes (Signed)
11:22 PM Pt is a 78 yo woman who slipped on a step and fell, injuring the right ankle.  Exam shows diffuse swelling of the right ankle.  The skin is intact.  She has intact pulses, sensation and tendon function in the right foot.  X-ray shows an undisplaced trimalleolar fracture.  Pt says that she has gone to Dr. Thurston Hole for orthopedic care in the past.  Call to Dr. Eulah Pont, who advised posterior splint, walker, pain medicine, and followup in their office.   Carleene Cooper III, MD 06/11/11 2325

## 2011-06-11 NOTE — ED Notes (Signed)
Pt left ankle is swollen and painful. Pt states pain 9/10.

## 2011-06-11 NOTE — ED Notes (Signed)
PT. SLIPPED AND FELL AT HOME THIS EVENING , NO LOC , REPORTS PAIN AT LEFT ANKLE WITH LEFT ELBOW ABRASION .

## 2011-06-12 DIAGNOSIS — S82899A Other fracture of unspecified lower leg, initial encounter for closed fracture: Secondary | ICD-10-CM | POA: Diagnosis not present

## 2011-06-12 DIAGNOSIS — S8990XA Unspecified injury of unspecified lower leg, initial encounter: Secondary | ICD-10-CM | POA: Diagnosis not present

## 2011-06-12 DIAGNOSIS — S82853A Displaced trimalleolar fracture of unspecified lower leg, initial encounter for closed fracture: Secondary | ICD-10-CM | POA: Diagnosis not present

## 2011-06-12 MED ORDER — OXYCODONE-ACETAMINOPHEN 5-325 MG PO TABS: 1.0000 | ORAL_TABLET | ORAL | Status: AC | PRN

## 2011-06-12 NOTE — ED Provider Notes (Signed)
Medical screening examination/treatment/procedure(s) were conducted as a shared visit with non-physician practitioner(s) and myself.  I personally evaluated the patient during the encounter Pt twisted her ankle going down steps.  Exam shows diffusely swolllen right ankle.  X-rays showed undisplaced trimalleolar fracture.  Case discussed with Mckinley Jewel, M.D., who advised posterior splint, walker, pain medicine, and to office to be seen and to arrange definitive surgery.  Carleene Cooper III, MD 06/12/11 1106

## 2011-06-13 ENCOUNTER — Encounter (HOSPITAL_BASED_OUTPATIENT_CLINIC_OR_DEPARTMENT_OTHER): Payer: Self-pay | Admitting: *Deleted

## 2011-06-13 NOTE — Progress Notes (Signed)
Pt had ekg in er-will need istat-in pain-not moving unless has to- Will call dr Nadara Eaton for notes-states dr Eulah Pont is calling him also.

## 2011-06-13 NOTE — H&P (Signed)
[name] [id number] [phone] A Division of [facility]  [name], M.D.     [name], M.D.     [name], M.D. [name], M.D.    [name], M.D. [name], M.D. [name], D.O.          [name]. Barry Dienes, PA-C            Adrienne A. Shepperson, PA-C [place], OPA-C   RE: [name], [name]                                [id number]      DOB: 1933-02-27 PROGRESS NOTE: 06-12-11 [name] is a 78 year-old female referred through the emergency room last night at [facility].  Old patient in our office, last seen by Dr. Thurston [name] back in 2008.  She was in her garage, slipped and fell and twisted her left ankle.  Because of pain, swelling and deformity she was brought to the emergency room by family members last night.  I was contacted around 11:00.  She sustained a closed mildly displaced trimalleolar ankle fracture on the left.  I spoke with the emergency room physician, Dr. Ignacia [name].  Looked at her previous treatment notes, as well as her x-rays online.  She was placed in a splint, elevation and comes in today to discuss definitive treatment.  Accompanied by her daughter.   Her entire history is reviewed, updated and included in the chart.  This is significant for a history of coronary artery bypass surgery back in 2007.  She continues to be followed by Dr. Jacinto [name] from [facility].  I spoke with him on the phone today and as of her last follow up three months ago she is doing well and would really be a low risk for operative intervention.  Coming in to discuss definitive treatment and repair of her ankle.  She is on low-dose Aspirin, but no other anticoagulation products.    EXAMINATION: General exam is outlined and included in the chart.  This is a 78 year-old female.  Height: 5?2.  Weight: 160 pounds.  Splint removed.  Global swelling around her ankle, not marked.  No open injuries.  Neurovascularly intact distally.   Obvious swelling medially and laterally.  Otherwise awake, alert and without respiratory distress.  Heart: Regular in rate and rhythm.  Lungs: Clear.    X-RAYS: X-rays from [facility] reviewed.  Bimalleolar ankle fracture.  Syndesmosis not involved.  Posterior fracture relatively small.    DISPOSITION:  Injury and treatment outlined.  This is sufficiently displaced to warrant definitive treatment with open reduction internal fixation.  I have explained this to [name] and her daughter.  Procedures, risks, benefits and complications reviewed in detail.  We are going to try doing this on an outpatient basis with general anesthesia and popliteal block.  She is going to be non-weight bearing for a number of weeks until this is healed.  All alternatives explored and thoroughly discussed.  More than 45 minutes spent reviewing all of these issues last night, as well as covering matters today with her and her daughter, reviewing their x-rays with them and answering all questions.  Also spent time contacting Dr. Jacinto [name] to get appropriate cardiac clearance before proceeding.  A new well padded splint is applied.  She has a prescription for Percocet.  Strict elevation from now until Thursday when we anticipated proceeding  with definitive treatment.    [name], M.D.   Electronically verified by [name], M.D. DFM:jjh Cc: Dr. Yates [name], fax: [phone] 06-12-11 T 06-13-11

## 2011-06-14 ENCOUNTER — Ambulatory Visit (HOSPITAL_BASED_OUTPATIENT_CLINIC_OR_DEPARTMENT_OTHER)
Admission: RE | Admit: 2011-06-14 | Discharge: 2011-06-15 | Disposition: A | Discharge status: Home / Self Care | Payer: Medicare Other | Source: Ambulatory Visit | Attending: Orthopedic Surgery | Admitting: Orthopedic Surgery

## 2011-06-14 ENCOUNTER — Encounter (HOSPITAL_BASED_OUTPATIENT_CLINIC_OR_DEPARTMENT_OTHER): Payer: Self-pay | Admitting: Anesthesiology

## 2011-06-14 ENCOUNTER — Ambulatory Visit (HOSPITAL_BASED_OUTPATIENT_CLINIC_OR_DEPARTMENT_OTHER): Payer: Medicare Other | Admitting: Anesthesiology

## 2011-06-14 ENCOUNTER — Encounter (HOSPITAL_BASED_OUTPATIENT_CLINIC_OR_DEPARTMENT_OTHER): Payer: Self-pay

## 2011-06-14 ENCOUNTER — Encounter (HOSPITAL_BASED_OUTPATIENT_CLINIC_OR_DEPARTMENT_OTHER)
Admission: RE | Disposition: A | Discharge status: Home / Self Care | Payer: Self-pay | Source: Ambulatory Visit | Attending: Orthopedic Surgery

## 2011-06-14 DIAGNOSIS — W010XXA Fall on same level from slipping, tripping and stumbling without subsequent striking against object, initial encounter: Secondary | ICD-10-CM | POA: Insufficient documentation

## 2011-06-14 DIAGNOSIS — S82853A Displaced trimalleolar fracture of unspecified lower leg, initial encounter for closed fracture: Secondary | ICD-10-CM | POA: Insufficient documentation

## 2011-06-14 DIAGNOSIS — Y92009 Unspecified place in unspecified non-institutional (private) residence as the place of occurrence of the external cause: Secondary | ICD-10-CM | POA: Insufficient documentation

## 2011-06-14 DIAGNOSIS — G8918 Other acute postprocedural pain: Secondary | ICD-10-CM | POA: Diagnosis not present

## 2011-06-14 DIAGNOSIS — Z4789 Encounter for other orthopedic aftercare: Secondary | ICD-10-CM

## 2011-06-14 DIAGNOSIS — I2510 Atherosclerotic heart disease of native coronary artery without angina pectoris: Secondary | ICD-10-CM | POA: Diagnosis not present

## 2011-06-14 DIAGNOSIS — M25579 Pain in unspecified ankle and joints of unspecified foot: Secondary | ICD-10-CM | POA: Diagnosis not present

## 2011-06-14 HISTORY — DX: Cough: R05

## 2011-06-14 HISTORY — DX: Unspecified osteoarthritis, unspecified site: M19.90

## 2011-06-14 HISTORY — PX: ORIF ANKLE FRACTURE: SHX5408

## 2011-06-14 HISTORY — DX: Chronic cough: R05.3

## 2011-06-14 HISTORY — PX: FRACTURE SURGERY: SHX138

## 2011-06-14 LAB — POCT I-STAT, CHEM 8
BUN: 14 mg/dL (ref 6–23)
Calcium, Ion: 1.11 mmol/L — ABNORMAL LOW (ref 1.12–1.32)
Chloride: 98 mEq/L (ref 96–112)
Creatinine, Ser: 0.9 mg/dL (ref 0.50–1.10)
Glucose, Bld: 107 mg/dL — ABNORMAL HIGH (ref 70–99)
HCT: 40 % (ref 36.0–46.0)
Hemoglobin: 13.6 g/dL (ref 12.0–15.0)
Potassium: 3.6 mEq/L (ref 3.5–5.1)
Sodium: 138 mEq/L (ref 135–145)
TCO2: 33 mmol/L (ref 0–100)

## 2011-06-14 SURGERY — OPEN REDUCTION INTERNAL FIXATION (ORIF) ANKLE FRACTURE
Anesthesia: General | Site: Ankle | Laterality: Left | Wound class: Clean

## 2011-06-14 MED ORDER — OXYCODONE-ACETAMINOPHEN 5-325 MG PO TABS: 1.0000 | ORAL_TABLET | ORAL | Status: DC | PRN

## 2011-06-14 MED ORDER — ONDANSETRON HCL 4 MG/2ML IJ SOLN: 4.0000 mg | Freq: Four times a day (QID) | INTRAMUSCULAR | Status: DC | PRN

## 2011-06-14 MED ORDER — CEFAZOLIN SODIUM 1-5 GM-% IV SOLN
1.0000 g | Freq: Three times a day (TID) | INTRAVENOUS | Status: DC
  Administered 2011-06-14 – 2011-06-15 (×2): 1 g via INTRAVENOUS

## 2011-06-14 MED ORDER — METOCLOPRAMIDE HCL 5 MG PO TABS: 5.0000 mg | ORAL_TABLET | Freq: Three times a day (TID) | ORAL | Status: DC | PRN

## 2011-06-14 MED ORDER — ONDANSETRON HCL 4 MG PO TABS: 4.0000 mg | ORAL_TABLET | Freq: Four times a day (QID) | ORAL | Status: DC | PRN

## 2011-06-14 MED ORDER — LIDOCAINE HCL (CARDIAC) 20 MG/ML IV SOLN
INTRAVENOUS | Status: DC | PRN
  Administered 2011-06-14: 50 mg via INTRAVENOUS

## 2011-06-14 MED ORDER — BUPIVACAINE-EPINEPHRINE PF 0.5-1:200000 % IJ SOLN
INTRAMUSCULAR | Status: DC | PRN
  Administered 2011-06-14: 30 mL

## 2011-06-14 MED ORDER — METOCLOPRAMIDE HCL 5 MG/ML IJ SOLN: 5.0000 mg | Freq: Three times a day (TID) | INTRAMUSCULAR | Status: DC | PRN

## 2011-06-14 MED ORDER — HYDROMORPHONE HCL PF 1 MG/ML IJ SOLN: 0.5000 mg | INTRAMUSCULAR | Status: DC | PRN

## 2011-06-14 MED ORDER — KCL IN DEXTROSE-NACL 20-5-0.45 MEQ/L-%-% IV SOLN
INTRAVENOUS | Status: DC
  Administered 2011-06-14: 13:00:00 via INTRAVENOUS

## 2011-06-14 MED ORDER — MIDAZOLAM HCL 2 MG/2ML IJ SOLN: 2.0000 mg | INTRAMUSCULAR | Status: DC | PRN

## 2011-06-14 MED ORDER — BUPIVACAINE HCL (PF) 0.5 % IJ SOLN
INTRAMUSCULAR | Status: DC | PRN
  Administered 2011-06-14: 10 mL

## 2011-06-14 MED ORDER — ATROPINE SULFATE 0.4 MG/ML IJ SOLN
INTRAMUSCULAR | Status: DC | PRN
  Administered 2011-06-14: 0.4 mg via INTRAVENOUS

## 2011-06-14 MED ORDER — ACETAMINOPHEN 10 MG/ML IV SOLN
1000.0000 mg | Freq: Once | INTRAVENOUS | Status: AC
  Administered 2011-06-14: 1000 mg via INTRAVENOUS

## 2011-06-14 MED ORDER — POTASSIUM CHLORIDE IN NACL 20-0.45 MEQ/L-% IV SOLN: INTRAVENOUS | Status: DC

## 2011-06-14 MED ORDER — FENTANYL CITRATE 0.05 MG/ML IJ SOLN
100.0000 ug | INTRAMUSCULAR | Status: DC | PRN
  Administered 2011-06-14: 100 ug via INTRAVENOUS

## 2011-06-14 MED ORDER — DOCUSATE SODIUM 100 MG PO CAPS: 100.0000 mg | ORAL_CAPSULE | Freq: Two times a day (BID) | ORAL | Status: DC

## 2011-06-14 MED ORDER — FENTANYL CITRATE 0.05 MG/ML IJ SOLN
INTRAMUSCULAR | Status: DC | PRN
  Administered 2011-06-14: 25 ug via INTRAVENOUS

## 2011-06-14 MED ORDER — HYDROMORPHONE HCL PF 1 MG/ML IJ SOLN: 0.2500 mg | INTRAMUSCULAR | Status: DC | PRN

## 2011-06-14 MED ORDER — ONDANSETRON HCL 4 MG/2ML IJ SOLN
INTRAMUSCULAR | Status: DC | PRN
  Administered 2011-06-14: 4 mg via INTRAVENOUS

## 2011-06-14 MED ORDER — DEXAMETHASONE SODIUM PHOSPHATE 10 MG/ML IJ SOLN
INTRAMUSCULAR | Status: DC | PRN
  Administered 2011-06-14: 10 mg via INTRAVENOUS

## 2011-06-14 MED ORDER — LACTATED RINGERS IV SOLN
INTRAVENOUS | Status: DC
  Administered 2011-06-14: 10:00:00 via INTRAVENOUS

## 2011-06-14 MED ORDER — PHENYLEPHRINE HCL 10 MG/ML IJ SOLN
10.0000 mg | INTRAVENOUS | Status: DC | PRN
  Administered 2011-06-14: 50 ug/min via INTRAVENOUS

## 2011-06-14 MED ORDER — CEFAZOLIN SODIUM-DEXTROSE 2-3 GM-% IV SOLR
2.0000 g | INTRAVENOUS | Status: AC
  Administered 2011-06-14: 2 g via INTRAVENOUS

## 2011-06-14 MED ORDER — PROPOFOL 10 MG/ML IV BOLUS
INTRAVENOUS | Status: DC | PRN
  Administered 2011-06-14: 20 mg via INTRAVENOUS
  Administered 2011-06-14: 100 mg via INTRAVENOUS

## 2011-06-14 MED ORDER — GLYCOPYRROLATE 0.2 MG/ML IJ SOLN
INTRAMUSCULAR | Status: DC | PRN
  Administered 2011-06-14: 0.2 mg via INTRAVENOUS

## 2011-06-14 SURGICAL SUPPLY — 83 items
APL SKNCLS STERI-STRIP NONHPOA (GAUZE/BANDAGES/DRESSINGS)
BANDAGE ELASTIC 4 VELCRO ST LF (GAUZE/BANDAGES/DRESSINGS) ×1 IMPLANT
BANDAGE ELASTIC 6 VELCRO ST LF (GAUZE/BANDAGES/DRESSINGS) ×2 IMPLANT
BANDAGE ESMARK 6X9 LF (GAUZE/BANDAGES/DRESSINGS) ×1 IMPLANT
BENZOIN TINCTURE PRP APPL 2/3 (GAUZE/BANDAGES/DRESSINGS) IMPLANT
BIT DRILL CANN 2.7X625 NONSTRL (BIT) ×1 IMPLANT
BIT DRILL SOLID 2.5X110 (BIT) ×1 IMPLANT
BLADE SURG 15 STRL LF DISP TIS (BLADE) ×1 IMPLANT
BLADE SURG 15 STRL SS (BLADE) ×2
BNDG CMPR 9X6 STRL LF SNTH (GAUZE/BANDAGES/DRESSINGS) ×1
BNDG COHESIVE 4X5 TAN STRL (GAUZE/BANDAGES/DRESSINGS) ×2 IMPLANT
BNDG ESMARK 6X9 LF (GAUZE/BANDAGES/DRESSINGS) ×2
CANISTER SUCTION 1200CC (MISCELLANEOUS) ×2 IMPLANT
CLOTH BEACON ORANGE TIMEOUT ST (SAFETY) ×2 IMPLANT
COVER TABLE BACK 60X90 (DRAPES) ×2 IMPLANT
CUFF TOURNIQUET SINGLE 34IN LL (TOURNIQUET CUFF) ×1 IMPLANT
DECANTER SPIKE VIAL GLASS SM (MISCELLANEOUS) IMPLANT
DRAPE EXTREMITY T 121X128X90 (DRAPE) ×2 IMPLANT
DRAPE OEC MINIVIEW 54X84 (DRAPES) ×2 IMPLANT
DRAPE U 20/CS (DRAPES) ×2 IMPLANT
DRAPE U-SHAPE 47X51 STRL (DRAPES) ×2 IMPLANT
DRSG PAD ABDOMINAL 8X10 ST (GAUZE/BANDAGES/DRESSINGS) ×2 IMPLANT
DURAPREP 26ML APPLICATOR (WOUND CARE) ×2 IMPLANT
ELECT REM PT RETURN 9FT ADLT (ELECTROSURGICAL) ×2
ELECTRODE REM PT RTRN 9FT ADLT (ELECTROSURGICAL) ×1 IMPLANT
GAUZE XEROFORM 1X8 LF (GAUZE/BANDAGES/DRESSINGS) ×2 IMPLANT
GLOVE BIOGEL PI IND STRL 7.0 (GLOVE) IMPLANT
GLOVE BIOGEL PI IND STRL 8 (GLOVE) ×1 IMPLANT
GLOVE BIOGEL PI INDICATOR 7.0 (GLOVE) ×1
GLOVE BIOGEL PI INDICATOR 8 (GLOVE) ×1
GLOVE EXAM NITRILE PF MED BLUE (GLOVE) ×2 IMPLANT
GLOVE ORTHO TXT STRL SZ7.5 (GLOVE) ×2 IMPLANT
GLOVE SKINSENSE NS SZ6.5 (GLOVE) ×1
GLOVE SKINSENSE NS SZ7.5 (GLOVE) ×2
GLOVE SKINSENSE STRL SZ6.5 (GLOVE) IMPLANT
GLOVE SKINSENSE STRL SZ7.5 (GLOVE) IMPLANT
GOWN BRE IMP PREV XXLGXLNG (GOWN DISPOSABLE) ×2 IMPLANT
GOWN PREVENTION PLUS XLARGE (GOWN DISPOSABLE) ×3 IMPLANT
GUIDEWIRE THREADED 150MM (WIRE) ×3 IMPLANT
KIT 1/3 TUB PL 6H 73M (Orthopedic Implant) IMPLANT
NDL HYPO 25X1 1.5 SAFETY (NEEDLE) IMPLANT
NEEDLE HYPO 25X1 1.5 SAFETY (NEEDLE) IMPLANT
NS IRRIG 1000ML POUR BTL (IV SOLUTION) ×2 IMPLANT
PACK BASIN DAY SURGERY FS (CUSTOM PROCEDURE TRAY) ×2 IMPLANT
PAD CAST 4YDX4 CTTN HI CHSV (CAST SUPPLIES) ×2 IMPLANT
PADDING CAST COTTON 4X4 STRL (CAST SUPPLIES) ×2
PADDING CAST COTTON 6X4 STRL (CAST SUPPLIES) ×1 IMPLANT
PENCIL BUTTON HOLSTER BLD 10FT (ELECTRODE) ×2 IMPLANT
PROS 1/3 TUB PL 6H 73M (Orthopedic Implant) ×2 IMPLANT
SCREW CANC FT ST SFS 4X14 (Screw) ×1 IMPLANT
SCREW CANC FT ST SFS 4X16 (Screw) ×1 IMPLANT
SCREW CANN S THRD/44 4.0 (Screw) ×1 IMPLANT
SCREW CANN S THRD/50 4.0 (Screw) ×1 IMPLANT
SCREW CORTEX 3.5 12MM (Screw) ×1 IMPLANT
SCREW CORTEX 3.5 14MM (Screw) ×2 IMPLANT
SCREW CORTEX 3.5 16MM (Screw) ×1 IMPLANT
SCREW LOCK CORT ST 3.5X12 (Screw) IMPLANT
SCREW LOCK CORT ST 3.5X14 (Screw) IMPLANT
SCREW LOCK CORT ST 3.5X16 (Screw) IMPLANT
SLEEVE SCD COMPRESS KNEE MED (MISCELLANEOUS) ×1 IMPLANT
SPLINT FAST PLASTER 5X30 (CAST SUPPLIES)
SPLINT FIBERGLASS 3X35 (CAST SUPPLIES) ×2 IMPLANT
SPLINT FIBERGLASS 4X30 (CAST SUPPLIES) IMPLANT
SPLINT PLASTER CAST FAST 5X30 (CAST SUPPLIES) IMPLANT
SPONGE GAUZE 4X4 12PLY (GAUZE/BANDAGES/DRESSINGS) ×2 IMPLANT
SPONGE LAP 4X18 X RAY DECT (DISPOSABLE) ×2 IMPLANT
STAPLER VISISTAT 35W (STAPLE) IMPLANT
STOCKINETTE 6  STRL (DRAPES) ×1
STOCKINETTE 6 STRL (DRAPES) ×1 IMPLANT
STRIP CLOSURE SKIN 1/2X4 (GAUZE/BANDAGES/DRESSINGS) IMPLANT
SUCTION FRAZIER TIP 10 FR DISP (SUCTIONS) IMPLANT
SUT ETHILON 3 0 PS 1 (SUTURE) IMPLANT
SUT VIC AB 0 CT1 27 (SUTURE) ×2
SUT VIC AB 0 CT1 27XBRD ANBCTR (SUTURE) ×1 IMPLANT
SUT VIC AB 2-0 SH 27 (SUTURE) ×4
SUT VIC AB 2-0 SH 27XBRD (SUTURE) IMPLANT
SUT VICRYL 4-0 PS2 18IN ABS (SUTURE) IMPLANT
SYR BULB 3OZ (MISCELLANEOUS) ×2 IMPLANT
SYR CONTROL 10ML LL (SYRINGE) ×1 IMPLANT
TUBE CONNECTING 20X1/4 (TUBING) ×3 IMPLANT
UNDERPAD 30X30 INCONTINENT (UNDERPADS AND DIAPERS) ×2 IMPLANT
WATER STERILE IRR 1000ML POUR (IV SOLUTION) ×1 IMPLANT
YANKAUER SUCT BULB TIP NO VENT (SUCTIONS) IMPLANT

## 2011-06-14 NOTE — Anesthesia Procedure Notes (Addendum)
Anesthesia Regional Block:  Popliteal block  Pre-Anesthetic Checklist: ,, timeout performed, Correct Patient, Correct Site, Correct Laterality, Correct Procedure, Correct Position, site marked, Risks and benefits discussed, pre-op evaluation, post-op pain management  Laterality: Left  Prep: Maximum Sterile Barrier Precautions used and chloraprep       Needles:  Injection technique: Single-shot  Needle Type: Echogenic Stimulator Needle          Additional Needles:  Procedures: ultrasound guided and nerve stimulator Popliteal block  Nerve Stimulator or Paresthesia:  Response: Peroneal,  Response: Tibial, 0.4 mA,   Additional Responses:   Narrative:  Start time: 06/14/2011 9:55 AM End time: 06/14/2011 10:06 AM Injection made incrementally with aspirations every 5 mL. Anesthesiologist: Sampson Goon, MD  Additional Notes: 2% Lidocaine skin wheel. Saphenous block above the ankle with 10cc of 0.5% Bupivicaine plain.  Popliteal block Procedure Name: LMA Insertion Performed by: York Grice Pre-anesthesia Checklist: Patient identified, Patient being monitored, Emergency Drugs available, Timeout performed and Suction available Patient Re-evaluated:Patient Re-evaluated prior to inductionOxygen Delivery Method: Circle system utilized Preoxygenation: Pre-oxygenation with 100% oxygen Intubation Type: IV induction Ventilation: Mask ventilation without difficulty LMA: LMA inserted LMA Size: 4.0 Number of attempts: 1 Placement Confirmation: breath sounds checked- equal and bilateral and positive ETCO2 Tube secured with: Tape Dental Injury: Teeth and Oropharynx as per pre-operative assessment

## 2011-06-14 NOTE — Anesthesia Preprocedure Evaluation (Signed)
Anesthesia Evaluation  Patient identified by MRN, date of birth, ID band Patient awake    Reviewed: Allergy & Precautions, H&P , NPO status , Patient's Chart, lab work & pertinent test results, reviewed documented beta blocker date and time   Airway Mallampati: III TM Distance: >3 FB Neck ROM: Full    Dental No notable dental hx. (+) Teeth Intact and Dental Advisory Given   Pulmonary neg pulmonary ROS,  breath sounds clear to auscultation  Pulmonary exam normal       Cardiovascular hypertension, On Medications and On Home Beta Blockers + CAD Rhythm:Regular Rate:Normal     Neuro/Psych negative neurological ROS  negative psych ROS   GI/Hepatic negative GI ROS, Neg liver ROS,   Endo/Other  negative endocrine ROS  Renal/GU negative Renal ROS  negative genitourinary   Musculoskeletal   Abdominal   Peds  Hematology negative hematology ROS (+)   Anesthesia Other Findings   Reproductive/Obstetrics negative OB ROS                           Anesthesia Physical Anesthesia Plan  ASA: III  Anesthesia Plan: General   Post-op Pain Management:    Induction: Intravenous  Airway Management Planned: LMA  Additional Equipment:   Intra-op Plan:   Post-operative Plan: Extubation in OR  Informed Consent: I have reviewed the patients History and Physical, chart, labs and discussed the procedure including the risks, benefits and alternatives for the proposed anesthesia with the patient or authorized representative who has indicated his/her understanding and acceptance.   Dental advisory given  Plan Discussed with: CRNA  Anesthesia Plan Comments:         Anesthesia Quick Evaluation

## 2011-06-14 NOTE — Interval H&P Note (Signed)
History and Physical Interval Note:  06/14/2011 7:45 AM  [name]  has presented today for surgery, with the diagnosis of left ankle trimalleolar fracture  The various methods of treatment have been discussed with the patient and family. After consideration of risks, benefits and other options for treatment, the patient has consented to  Procedure(s) (LRB): OPEN REDUCTION INTERNAL FIXATION (ORIF) ANKLE FRACTURE (Left) as a surgical intervention .  The patients' history has been reviewed, patient examined, no change in status, stable for surgery.  I have reviewed the patients' chart and labs.  Questions were answered to the patient's satisfaction.     [name]

## 2011-06-14 NOTE — Progress Notes (Signed)
Assisted Dr. Sampson Goon with left, popliteal block. Side rails up, monitors on throughout procedure. See vital signs in flow sheet. Tolerated Procedure well.

## 2011-06-14 NOTE — Transfer of Care (Signed)
Immediate Anesthesia Transfer of Care Note  Patient: [name]  Procedure(s) Performed: Procedure(s) (LRB): OPEN REDUCTION INTERNAL FIXATION (ORIF) ANKLE FRACTURE (Left)  Patient Location: PACU  Anesthesia Type: General  Level of Consciousness: sedated  Airway & Oxygen Therapy: Patient Spontanous Breathing and Patient connected to face mask oxygen  Post-op Assessment: Report given to PACU RN and Post -op Vital signs reviewed and stable  Post vital signs: Reviewed and stable  Complications: No apparent anesthesia complications

## 2011-06-14 NOTE — Brief Op Note (Signed)
06/14/2011  11:42 AM  PATIENT:  [name]  78 y.o. female  PRE-OPERATIVE DIAGNOSIS:  left ankle trimalleolar fracture  POST-OPERATIVE DIAGNOSIS:  left ankle trimalleolar fracture  PROCEDURE:  Procedure(s) (LRB): OPEN REDUCTION INTERNAL FIXATION (ORIF) ANKLE FRACTURE (Left)  SURGEON:  Surgeon(s) and Role:    * Loreta Ave, MD - Primary  PHYSICIAN ASSISTANT: Zonia Kief M   ANESTHESIA:   regional and general  EBL:  Total I/O In: 1000 [I.V.:1000] Out: -   SPECIMEN:  No Specimen  DISPOSITION OF SPECIMEN:  N/A  COUNTS:  YES  TOURNIQUET:   Total Tourniquet Time Documented: Thigh (Left) - 55 minutes  PATIENT DISPOSITION:  PACU - hemodynamically stable.

## 2011-06-14 NOTE — Anesthesia Postprocedure Evaluation (Signed)
Anesthesia Post Note  Patient: [place]  Procedure(s) Performed: Procedure(s) (LRB): OPEN REDUCTION INTERNAL FIXATION (ORIF) ANKLE FRACTURE (Left)  Anesthesia type: General  Patient location: PACU  Post pain: Pain level controlled and Adequate analgesia  Post assessment: Post-op Vital signs reviewed, Patient's Cardiovascular Status Stable, Respiratory Function Stable, Patent Airway and Pain level controlled  Last Vitals:  Filed Vitals:   06/14/11 1200  BP: 110/53  Pulse: 69  Temp:   Resp: 19    Post vital signs: Reviewed and stable  Level of consciousness: awake, alert  and oriented  Complications: No apparent anesthesia complications

## 2011-06-15 ENCOUNTER — Encounter (HOSPITAL_BASED_OUTPATIENT_CLINIC_OR_DEPARTMENT_OTHER): Payer: Self-pay | Admitting: Orthopedic Surgery

## 2011-06-15 NOTE — Discharge Instructions (Signed)
   Regional Anesthesia Blocks  1. Numbness or the inability to move the "blocked" extremity may last from 3-48 hours after placement. The length of time depends on the medication injected and your individual response to the medication. If the numbness is not going away after 48 hours, call your surgeon.  2. The extremity that is blocked will need to be protected until the numbness is gone and the  Strength has returned. Because you cannot feel it, you will need to take extra care to avoid injury. Because it may be weak, you may have difficulty moving it or using it. You may not know what position it is in without looking at it while the block is in effect.  3. For blocks in the legs and feet, returning to weight bearing and walking needs to be done carefully. You will need to wait until the numbness is entirely gone and the strength has returned. You should be able to move your leg and foot normally before you try and bear weight or walk. You will need someone to be with you when you first try to ensure you do not fall and possibly risk injury.  4. Bruising and tenderness at the needle site are common side effects and will resolve in a few days.  5. Persistent numbness or new problems with movement should be communicated to the surgeon or the [facility] [phone])  Call your surgeon if you experience:   1.  Fever over 101.0. 2.  Inability to urinate. 3.  Nausea and/or vomiting. 4.  Extreme swelling or bruising at the surgical site. 5.  Continued bleeding from the incision. 6.  Increased pain, redness or drainage from the incision. 7.  Problems related to your pain medication.   Discharge Instructions After Orthopedic Procedures:  *You may feel tired and weak following your procedure. It is recommended that you limit physical activity for the next 24 hours and rest at home for the remainder of today and tomorrow. *No strenuous activity should be started without your  doctor's permission.  Elevate the extremity that you had surgery on to a level above your heart. This should continue for 48 hours or as instructed by your doctor.  If you had foot, knee or leg surgery you should wiggle your toes frequently unless otherwise instructed by your doctor.  Follow your doctor's exact instructions for activity at home. Use your home equipment as instructed. (Crutches, hard shoes, etc.)  Limit your activity as instructed by your doctor.  Report to your doctor should any of the following occur: 1. Extreme swelling of your toes. 2. Inability to wiggle your toes. 3. Coldness, pale or bluish color in your toes. 4. Loss of sensation, numbness or tingling of your toes. 5. Unusual smell or odor from under your dressing or cast. 6. Excessive bleeding or drainage from the surgical site. 7. Pain not relieved by medication your doctor has prescribed for you. 8. Cast or dressing too tight (do not get your dressing or cast wet or put anything under your dressing or cast.)  *Do not change your dressing unless instructed by your doctor or discharge nurse. Then follow exact instructions.  *Follow labeled instructions for any medications that your doctor may have prescribed for you. *Should any questions or complications develop following your procedure, PLEASE CONTACT YOUR DOCTOR.

## 2011-06-15 NOTE — Op Note (Signed)
[name], [name]            ACCOUNT NO.:  [id number]  MEDICAL RECORD NO.:  [id number]  LOCATION:  STRE6                        FACILITY:  [facility]  PHYSICIAN:  Loreta Ave, M.D. DATE OF BIRTH:  1933-02-11  DATE OF PROCEDURE:  06/14/2011 DATE OF DISCHARGE:                              OPERATIVE REPORT   PREOPERATIVE DIAGNOSIS:  Displaced closed trimalleolar ankle fracture, left.  POSTOPERATIVE DIAGNOSIS:  Displaced closed trimalleolar ankle fracture, left, with underlying osteopenia.  PROCEDURE:  Left ankle open reduction and internal fixation of medial and lateral malleolus fractures.  Two 4.0 cannulated screws medially.  A 6-hole 1/3 tubular plate laterally.  Closed treatment of posterior fracture.  SURGEON:  Loreta Ave, MD  ASSISTANT:  Genene Churn. Barry Dienes, [place], present throughout the entire case and necessary for timely completion of the procedure.  ANESTHESIA:  General.  BLOOD LOSS:  Minimal.  SPECIMENS:  None.  CULTURES:  None.  COMPLICATION:  None.  DRESSINGS:  Soft compressive with short leg splint.  TOURNIQUET TIME:  45 minutes.  PROCEDURE:  The patient was brought to the operating room and placed on the operating table in supine position.  After adequate anesthesia had been obtained, tourniquet applied to the upper aspect of the left leg. Prepped and draped in usual sterile fashion.  Exsanguinated with elevation of [place].  Tourniquet inflated to 350 mmHg.  Fluoroscopic guidance used throughout.  A longitudinal incision was made medial slide, slightly anterior.  This was brought down below the tip of the medial malleolus.  Hemostasis with cautery.  Fracture site identified and reduced anatomically.  Fixed temporarily with 2 guidewires with good placement, reduction confirmed fluoroscopically.  This was then overdrilled and firmly fixed with 2 lag screws.  Very osteopenic, but I was able to get good, solid , and stable fixation with  anatomic alignment and a congruent joint surface.  Attention turned laterally. Longitudinal incision over the posterolateral aspect of the fibula. Skin and subcutaneous tissue divided.  Subperiosteal exposure of the spiral fracture of the fibula.  Syndesmosis not disrupted.  Again, very osteopenic bone there.  Reduced anatomically.  A 6-hole plate was placed posterolateral to reduce and hold the fracture.  Two cancellous screws distally.  Four bicortical screws proximally.  This bridge of fracture gave me good alignment, solid and stable fixation.  At completion, the overall construct examined.  I got AP and lateral view as well as bringing the ankle through motion on both views to be sure of being [place] congruent.  Posterior fracture was small and did not require further fixation.  Once this was all confirmed, wound was irrigated.  Closed with Vicryl and staples.  Sterile compressive dressing applied.  Short- leg splint applied.  Anesthesia reversed.  Brought to the recovery room. Tolerated the surgery well.  No complications.     Loreta Ave, M.D.     DFM/MEDQ  D:  06/14/2011  T:  06/15/2011  Job:  [id number]

## 2011-06-19 ENCOUNTER — Ambulatory Visit: Payer: Medicare Other

## 2011-06-26 DIAGNOSIS — S8290XD Unspecified fracture of unspecified lower leg, subsequent encounter for closed fracture with routine healing: Secondary | ICD-10-CM | POA: Diagnosis not present

## 2011-06-26 DIAGNOSIS — S82853A Displaced trimalleolar fracture of unspecified lower leg, initial encounter for closed fracture: Secondary | ICD-10-CM | POA: Diagnosis not present

## 2011-06-28 DIAGNOSIS — S82853A Displaced trimalleolar fracture of unspecified lower leg, initial encounter for closed fracture: Secondary | ICD-10-CM | POA: Diagnosis not present

## 2011-06-28 DIAGNOSIS — S8290XD Unspecified fracture of unspecified lower leg, subsequent encounter for closed fracture with routine healing: Secondary | ICD-10-CM | POA: Diagnosis not present

## 2011-06-30 ENCOUNTER — Emergency Department (HOSPITAL_COMMUNITY)
Admission: EM | Admit: 2011-06-30 | Discharge: 2011-06-30 | Disposition: A | Discharge status: Home / Self Care | Payer: Medicare Other | Attending: Emergency Medicine | Admitting: Emergency Medicine

## 2011-06-30 ENCOUNTER — Encounter (HOSPITAL_COMMUNITY): Payer: Self-pay | Admitting: Emergency Medicine

## 2011-06-30 DIAGNOSIS — Z79899 Other long term (current) drug therapy: Secondary | ICD-10-CM | POA: Diagnosis not present

## 2011-06-30 DIAGNOSIS — T7840XA Allergy, unspecified, initial encounter: Secondary | ICD-10-CM | POA: Diagnosis not present

## 2011-06-30 DIAGNOSIS — T40605A Adverse effect of unspecified narcotics, initial encounter: Secondary | ICD-10-CM | POA: Insufficient documentation

## 2011-06-30 DIAGNOSIS — Z888 Allergy status to other drugs, medicaments and biological substances status: Secondary | ICD-10-CM | POA: Diagnosis not present

## 2011-06-30 DIAGNOSIS — I2510 Atherosclerotic heart disease of native coronary artery without angina pectoris: Secondary | ICD-10-CM | POA: Insufficient documentation

## 2011-06-30 DIAGNOSIS — E7800 Pure hypercholesterolemia, unspecified: Secondary | ICD-10-CM | POA: Diagnosis not present

## 2011-06-30 DIAGNOSIS — Z7982 Long term (current) use of aspirin: Secondary | ICD-10-CM | POA: Diagnosis not present

## 2011-06-30 DIAGNOSIS — Z951 Presence of aortocoronary bypass graft: Secondary | ICD-10-CM | POA: Diagnosis not present

## 2011-06-30 MED ORDER — TRAMADOL HCL 50 MG PO TABS: 50.0000 mg | ORAL_TABLET | Freq: Four times a day (QID) | ORAL | Status: AC | PRN

## 2011-06-30 NOTE — Discharge Instructions (Signed)
You were seen and evaluated for your rash following the use of your pain medications. At this time your providers recommend that you stop taking your pain medications were prescribed. You have been given a new prescription for other pain medication to try. If this causes any rash or itching please take Benadryl and Zantac or return to emergency room if symptoms are severe and you have any shortness of breath or swelling of your mouth and throat. Please followup with your primary care provider and orthopedic specialist on Tuesday for additional evaluation and treatment. He may also take Tylenol, ibuprofen or Aleve for your symptoms of pain.   Drug Allergy Allergic reactions to medicines are common. Some allergic reactions are mild. A delayed type of drug allergy that occurs 1 week or more after exposure to a medicine or vaccine is called serum sickness. A life-threatening, sudden (acute) allergic reaction that involves the whole body is called anaphylaxis. CAUSES  "True" drug allergies occur when there is an allergic reaction to a medicine. This is caused by overactivity of the immune system. First, the body becomes sensitized. The immune system is triggered by your first exposure to the medicine. Following this first exposure, future exposure to the same medicine may be life-threatening. Almost any medicine can cause an allergic reaction. Common ones are:  Penicillin.   Sulfonamides (sulfa drugs).   Local anesthetics.   X-ray dyes that contain iodine.  SYMPTOMS  Common symptoms of a minor allergic reaction are:  Swelling around the mouth.   An itchy red rash or hives.   Vomiting or diarrhea.  Anaphylaxis can cause swelling of the mouth and throat. This makes it difficult to breathe and swallow. Severe reactions can be fatal within seconds, even after exposure to only a trace amount of the drug that causes the reaction. HOME CARE INSTRUCTIONS   If you are unsure of what caused your  reaction, keep a diary of foods and medicines used. Include the symptoms that followed. Avoid anything that causes reactions.   You may want to follow up with an allergy specialist after the reaction has cleared in order to be tested to confirm the allergy. It is important to confirm that your reaction is an allergy, not just a side effect to the medicine. If you have a true allergy to a medicine, this may prevent that medicine and related medicines from being given to you when you are very ill.   If you have hives or a rash:   Take medicines as directed by your caregiver.   You may use an over-the-counter antihistamine (diphenhydramine) as needed.   Apply cold compresses to the skin or take baths in cool water. Avoid hot baths or showers.   If you are severely allergic:   Continuous observation after a severe reaction may be needed. Hospitalization is often required.   Wear a medical alert bracelet or necklace stating your allergy.   You and your family must learn how to use an anaphylaxis kit or give an epinephrine injection to temporarily treat an emergency allergic reaction. If you have had a severe reaction, always carry your epinephrine injection or anaphylaxis kit with you. This can be lifesaving if you have a severe reaction.   Do not drive or perform tasks after treatment until the medicines used to treat your reaction have worn off, or until your caregiver says it is okay.  SEEK MEDICAL CARE IF:   You think you had an allergic reaction. Symptoms usually start within 30  minutes after exposure.   Symptoms are getting worse rather than better.   You develop new symptoms.   The symptoms that brought you to your caregiver return.  SEEK IMMEDIATE MEDICAL CARE IF:   You have swelling of the mouth, difficulty breathing, or wheezing.   You have a tight feeling in your chest or throat.   You develop hives, swelling, or itching all over your body.   You develop severe vomiting or  diarrhea.   You feel faint or pass out.  This is an emergency. Use your epinephrine injection or anaphylaxis kit as you have been instructed. Call for emergency medical help. Even if you improve after the injection, you need to be examined at a hospital emergency department. MAKE SURE YOU:   Understand these instructions.   Will watch your condition.   Will get help right away if you are not doing well or get worse.  Document Released: 01/22/2005 Document Revised: 01/11/2011 Document Reviewed: 06/28/2010 [facility] Patient Information 2012 [place], [place].    RESOURCE GUIDE  Dental Problems  Patients with Medicaid: [facility] [phone] [address]                                           [phone] [address] Phone:  [phone]                                                  Phone:  [phone]  If unable to pay or uninsured, contact:  [facility] or [facility] to become qualified for the adult dental clinic.  Chronic Pain Problems Contact [facility]  [phone] Patients need to be referred by their primary care doctor.  Insufficient Money for Medicine Contact United Way:  call "211" or [facility] ([phone].  No Primary Care Doctor Call [facility]  [phone] Other agencies that provide inexpensive medical care    [facility]  [phone]    [facility]  [phone]    [facility]  [phone]    [facility]  [phone]    [facility]  [phone]    [facility]  [phone]  Psychological Services [facility]  [phone] [facility] Services  [phone] [facility]   [phone] (emergency services [phone])  Substance Abuse Resources [facility]  2481354980 [facility] [phone] [facility] [phone] Floydene Flock [phone] Residential & Outpatient  Substance Abuse Program  971-133-3996  Abuse/Neglect [facility] Child Abuse Hotline 607-226-6588 [facility] Child [facility] Hotline (952) 330-7243 (After Hours)  Emergency Shelter [facility] (740)868-2333  Maternity Homes Room at the [place] [facility] [phone]) 471-1237 [place] [facility] Services [phone]  MRSA Hotline #:   (601)401-9084    [facility] Resources  [facility]     [employer]                          [facility] Dept. [address] [place] Phone:  [phone]                                   Phone:  [phone]                 Phone:  [place] Phone:  [place] [phone] (After Hours)

## 2011-06-30 NOTE — ED Provider Notes (Signed)
History     CSN: [id number]  Arrival date & time 06/30/11  2152   First MD Initiated Contact with Patient 06/30/11 2236      Chief Complaint  Patient presents with  . Allergic Reaction   HPI  History provided by the patient and friend. Patient is a 78 year old female with history of hypertension, hyperlipidemia, CAD and recent left ankle fracture who presents with concerns for allergic reaction to pain medications. Patient was given prescriptions for oxycodone as well as prescriptions for hydrocodone to treat pain following her left ankle fracture. Patient was first given a prescription for oxycodone but developed a generalized pruritic rash after using this. Patient was then given a prescription for hydrocodone which she took earlier today but again had a similar reaction with a generalized rash and itching. Patient also reports having swelling of her lower lip. She denied any swelling of her tongue or throat area. She denied any shortness of breath. Patient took a Benadryl and Zantac and seemed to have relief of symptoms. Patient now reports the rash has gone down considerably she is feeling much better but does complain of pain to her left lower extremity from injury. Patient denies any other symptoms. She denies any other aggravating or alleviating factors. She has no other known allergies.     Past Medical History  Diagnosis Date  . Coronary artery disease   . Hypercholesteremia   . Chronic cough   . Arthritis     Past Surgical History  Procedure Date  . Coronary artery bypass graft   . Abdominal hysterectomy   . Appendectomy   . Eye surgery     both cataracts  . Orif ankle fracture 06/14/2011    Procedure: OPEN REDUCTION INTERNAL FIXATION (ORIF) ANKLE FRACTURE;  Surgeon: Loreta Ave, MD;  Location: [facility];  Service: Orthopedics;  Laterality: Left;  left ankle fracture open treatment trimalleolar ankle includes internal fixation WITHOUT fixation of  posterior lip    No family history on file.  History  Substance Use Topics  . Smoking status: Never Smoker   . Smokeless tobacco: Not on file  . Alcohol Use: No    OB History    Grav Para Term Preterm Abortions TAB SAB Ect Mult Living                  Review of Systems  Constitutional: Negative for fever and chills.  HENT: Negative for trouble swallowing.   Respiratory: Negative for cough and shortness of breath.   Cardiovascular: Negative for chest pain.  Gastrointestinal: Negative for nausea, vomiting and abdominal pain.  Skin: Positive for rash.    Allergies  Latex  Home Medications   Current Outpatient Rx  Name Route Sig Dispense Refill  . ALBUTEROL SULFATE HFA 108 (90 BASE) MCG/ACT IN AERS Inhalation Inhale 2 puffs into the lungs every 6 (six) hours as needed. For wheezing/cough    . ASPIRIN 81 MG PO TABS Oral Take 81 mg by mouth daily.    Marland Kitchen VITAMIN D PO Oral Take 1 capsule by mouth daily.    Marland Kitchen CITALOPRAM HYDROBROMIDE 20 MG PO TABS Oral Take 20 mg by mouth daily.    Marland Kitchen HYDROCHLOROTHIAZIDE 25 MG PO TABS Oral Take 25 mg by mouth daily.    Marland Kitchen HYDROCODONE-ACETAMINOPHEN 5-325 MG PO TABS Oral Take 1-2 tablets by mouth every 6 (six) hours as needed. For pain    . LOSARTAN POTASSIUM 100 MG PO TABS Oral Take 100 mg by  mouth daily.    Marland Kitchen METOPROLOL TARTRATE 25 MG PO TABS Oral Take 25 mg by mouth 2 (two) times daily.    . ADULT MULTIVITAMIN W/MINERALS CH Oral Take 1 tablet by mouth daily.    Marland Kitchen ROSUVASTATIN CALCIUM 10 MG PO TABS Oral Take 10 mg by mouth every evening.    Marland Kitchen TRAMADOL HCL 50 MG PO TABS Oral Take 1 tablet (50 mg total) by mouth every 6 (six) hours as needed for pain. 15 tablet 0    BP 132/70  Pulse 59  Temp(Src) 98.9 F (37.2 C) (Oral)  Resp 16  Ht 5\' 2"  (1.575 m)  Wt 160 lb (72.576 kg)  BMI 29.26 kg/m2  SpO2 95%  Physical Exam  Nursing note and vitals reviewed. Constitutional: She is oriented to person, place, and time. She appears well-developed and  well-nourished. No distress.  HENT:  Head: Normocephalic.  Mouth/Throat: Oropharynx is clear and moist.       No swelling of lips, tongue or oropharynx.  Neck: Normal range of motion. Neck supple.  Cardiovascular: Normal rate and regular rhythm.   Pulmonary/Chest: Effort normal and breath sounds normal. No stridor. No respiratory distress. She has no wheezes.  Abdominal: Soft.  Neurological: She is alert and oriented to person, place, and time.  Skin: Skin is warm and dry.       Slight erythematous patches to left abdomen area. These appear to be resolving. No other rash present at this time.  Psychiatric: She has a normal mood and affect. Her behavior is normal.    ED Course  Procedures     1. Allergic drug reaction       MDM  Patient seen and evaluated. Patient no acute distress.        Angus Seller, [place] 07/01/11 [phone]

## 2011-06-30 NOTE — ED Notes (Signed)
Taking oxycontin and recently changed to hydrocodone- after change, started have rash, itching and swelling to upper lip; denies difficulty breathing

## 2011-07-02 NOTE — ED Provider Notes (Signed)
Medical screening examination/treatment/procedure(s) were performed by non-physician practitioner and as supervising physician I was immediately available for consultation/collaboration.  Juliet Rude. Rubin Payor, MD 07/02/11 4540

## 2011-07-20 DIAGNOSIS — S8290XD Unspecified fracture of unspecified lower leg, subsequent encounter for closed fracture with routine healing: Secondary | ICD-10-CM | POA: Diagnosis not present

## 2011-07-20 DIAGNOSIS — S82853A Displaced trimalleolar fracture of unspecified lower leg, initial encounter for closed fracture: Secondary | ICD-10-CM | POA: Diagnosis not present

## 2011-07-26 ENCOUNTER — Ambulatory Visit: Payer: Medicare Other

## 2011-08-06 DIAGNOSIS — S2239XA Fracture of one rib, unspecified side, initial encounter for closed fracture: Secondary | ICD-10-CM | POA: Diagnosis not present

## 2011-08-17 DIAGNOSIS — S8290XD Unspecified fracture of unspecified lower leg, subsequent encounter for closed fracture with routine healing: Secondary | ICD-10-CM | POA: Diagnosis not present

## 2011-08-17 DIAGNOSIS — S82853A Displaced trimalleolar fracture of unspecified lower leg, initial encounter for closed fracture: Secondary | ICD-10-CM | POA: Diagnosis not present

## 2011-08-22 DIAGNOSIS — S82853A Displaced trimalleolar fracture of unspecified lower leg, initial encounter for closed fracture: Secondary | ICD-10-CM | POA: Diagnosis not present

## 2011-08-22 DIAGNOSIS — M25579 Pain in unspecified ankle and joints of unspecified foot: Secondary | ICD-10-CM | POA: Diagnosis not present

## 2011-08-24 DIAGNOSIS — S82853A Displaced trimalleolar fracture of unspecified lower leg, initial encounter for closed fracture: Secondary | ICD-10-CM | POA: Diagnosis not present

## 2011-08-24 DIAGNOSIS — M25579 Pain in unspecified ankle and joints of unspecified foot: Secondary | ICD-10-CM | POA: Diagnosis not present

## 2011-08-27 DIAGNOSIS — M25579 Pain in unspecified ankle and joints of unspecified foot: Secondary | ICD-10-CM | POA: Diagnosis not present

## 2011-08-27 DIAGNOSIS — S82853A Displaced trimalleolar fracture of unspecified lower leg, initial encounter for closed fracture: Secondary | ICD-10-CM | POA: Diagnosis not present

## 2011-08-29 DIAGNOSIS — M25579 Pain in unspecified ankle and joints of unspecified foot: Secondary | ICD-10-CM | POA: Diagnosis not present

## 2011-08-29 DIAGNOSIS — S82853A Displaced trimalleolar fracture of unspecified lower leg, initial encounter for closed fracture: Secondary | ICD-10-CM | POA: Diagnosis not present

## 2011-08-31 DIAGNOSIS — S82853A Displaced trimalleolar fracture of unspecified lower leg, initial encounter for closed fracture: Secondary | ICD-10-CM | POA: Diagnosis not present

## 2011-08-31 DIAGNOSIS — M25579 Pain in unspecified ankle and joints of unspecified foot: Secondary | ICD-10-CM | POA: Diagnosis not present

## 2011-09-03 DIAGNOSIS — M25579 Pain in unspecified ankle and joints of unspecified foot: Secondary | ICD-10-CM | POA: Diagnosis not present

## 2011-09-03 DIAGNOSIS — S82853A Displaced trimalleolar fracture of unspecified lower leg, initial encounter for closed fracture: Secondary | ICD-10-CM | POA: Diagnosis not present

## 2011-09-05 DIAGNOSIS — S82853A Displaced trimalleolar fracture of unspecified lower leg, initial encounter for closed fracture: Secondary | ICD-10-CM | POA: Diagnosis not present

## 2011-09-05 DIAGNOSIS — M25579 Pain in unspecified ankle and joints of unspecified foot: Secondary | ICD-10-CM | POA: Diagnosis not present

## 2011-09-07 DIAGNOSIS — S82853A Displaced trimalleolar fracture of unspecified lower leg, initial encounter for closed fracture: Secondary | ICD-10-CM | POA: Diagnosis not present

## 2011-09-07 DIAGNOSIS — M25579 Pain in unspecified ankle and joints of unspecified foot: Secondary | ICD-10-CM | POA: Diagnosis not present

## 2011-09-10 DIAGNOSIS — S82853A Displaced trimalleolar fracture of unspecified lower leg, initial encounter for closed fracture: Secondary | ICD-10-CM | POA: Diagnosis not present

## 2011-09-10 DIAGNOSIS — I6529 Occlusion and stenosis of unspecified carotid artery: Secondary | ICD-10-CM | POA: Diagnosis not present

## 2011-09-10 DIAGNOSIS — M25579 Pain in unspecified ankle and joints of unspecified foot: Secondary | ICD-10-CM | POA: Diagnosis not present

## 2011-09-12 DIAGNOSIS — M25579 Pain in unspecified ankle and joints of unspecified foot: Secondary | ICD-10-CM | POA: Diagnosis not present

## 2011-09-12 DIAGNOSIS — S82853A Displaced trimalleolar fracture of unspecified lower leg, initial encounter for closed fracture: Secondary | ICD-10-CM | POA: Diagnosis not present

## 2011-09-14 DIAGNOSIS — S82853A Displaced trimalleolar fracture of unspecified lower leg, initial encounter for closed fracture: Secondary | ICD-10-CM | POA: Diagnosis not present

## 2011-09-17 DIAGNOSIS — M25579 Pain in unspecified ankle and joints of unspecified foot: Secondary | ICD-10-CM | POA: Diagnosis not present

## 2011-09-17 DIAGNOSIS — S82853A Displaced trimalleolar fracture of unspecified lower leg, initial encounter for closed fracture: Secondary | ICD-10-CM | POA: Diagnosis not present

## 2011-09-20 DIAGNOSIS — I2510 Atherosclerotic heart disease of native coronary artery without angina pectoris: Secondary | ICD-10-CM | POA: Diagnosis not present

## 2011-09-20 DIAGNOSIS — I10 Essential (primary) hypertension: Secondary | ICD-10-CM | POA: Diagnosis not present

## 2011-09-20 DIAGNOSIS — M25579 Pain in unspecified ankle and joints of unspecified foot: Secondary | ICD-10-CM | POA: Diagnosis not present

## 2011-09-20 DIAGNOSIS — Z951 Presence of aortocoronary bypass graft: Secondary | ICD-10-CM | POA: Diagnosis not present

## 2011-09-20 DIAGNOSIS — I6529 Occlusion and stenosis of unspecified carotid artery: Secondary | ICD-10-CM | POA: Diagnosis not present

## 2011-09-20 DIAGNOSIS — S82853A Displaced trimalleolar fracture of unspecified lower leg, initial encounter for closed fracture: Secondary | ICD-10-CM | POA: Diagnosis not present

## 2011-09-24 DIAGNOSIS — M25579 Pain in unspecified ankle and joints of unspecified foot: Secondary | ICD-10-CM | POA: Diagnosis not present

## 2011-09-24 DIAGNOSIS — S82853A Displaced trimalleolar fracture of unspecified lower leg, initial encounter for closed fracture: Secondary | ICD-10-CM | POA: Diagnosis not present

## 2011-09-27 DIAGNOSIS — M25579 Pain in unspecified ankle and joints of unspecified foot: Secondary | ICD-10-CM | POA: Diagnosis not present

## 2011-09-27 DIAGNOSIS — S82853A Displaced trimalleolar fracture of unspecified lower leg, initial encounter for closed fracture: Secondary | ICD-10-CM | POA: Diagnosis not present

## 2011-10-01 DIAGNOSIS — S82853A Displaced trimalleolar fracture of unspecified lower leg, initial encounter for closed fracture: Secondary | ICD-10-CM | POA: Diagnosis not present

## 2011-10-01 DIAGNOSIS — M25579 Pain in unspecified ankle and joints of unspecified foot: Secondary | ICD-10-CM | POA: Diagnosis not present

## 2011-10-03 DIAGNOSIS — M25579 Pain in unspecified ankle and joints of unspecified foot: Secondary | ICD-10-CM | POA: Diagnosis not present

## 2011-10-03 DIAGNOSIS — S82853A Displaced trimalleolar fracture of unspecified lower leg, initial encounter for closed fracture: Secondary | ICD-10-CM | POA: Diagnosis not present

## 2011-10-09 DIAGNOSIS — S82853A Displaced trimalleolar fracture of unspecified lower leg, initial encounter for closed fracture: Secondary | ICD-10-CM | POA: Diagnosis not present

## 2011-10-09 DIAGNOSIS — M25579 Pain in unspecified ankle and joints of unspecified foot: Secondary | ICD-10-CM | POA: Diagnosis not present

## 2011-10-11 DIAGNOSIS — S82853A Displaced trimalleolar fracture of unspecified lower leg, initial encounter for closed fracture: Secondary | ICD-10-CM | POA: Diagnosis not present

## 2011-10-11 DIAGNOSIS — M25579 Pain in unspecified ankle and joints of unspecified foot: Secondary | ICD-10-CM | POA: Diagnosis not present

## 2011-10-12 DIAGNOSIS — I10 Essential (primary) hypertension: Secondary | ICD-10-CM | POA: Diagnosis not present

## 2011-10-12 DIAGNOSIS — E7800 Pure hypercholesterolemia, unspecified: Secondary | ICD-10-CM | POA: Diagnosis not present

## 2011-10-18 DIAGNOSIS — I10 Essential (primary) hypertension: Secondary | ICD-10-CM | POA: Diagnosis not present

## 2011-10-18 DIAGNOSIS — M25579 Pain in unspecified ankle and joints of unspecified foot: Secondary | ICD-10-CM | POA: Diagnosis not present

## 2011-10-18 DIAGNOSIS — E7800 Pure hypercholesterolemia, unspecified: Secondary | ICD-10-CM | POA: Diagnosis not present

## 2011-10-19 ENCOUNTER — Ambulatory Visit
Admission: RE | Admit: 2011-10-19 | Discharge: 2011-10-19 | Disposition: A | Discharge status: Home / Self Care | Payer: PRIVATE HEALTH INSURANCE | Source: Ambulatory Visit | Attending: Internal Medicine | Admitting: Internal Medicine

## 2011-10-19 DIAGNOSIS — Z1231 Encounter for screening mammogram for malignant neoplasm of breast: Secondary | ICD-10-CM | POA: Diagnosis not present

## 2011-10-30 DIAGNOSIS — M25579 Pain in unspecified ankle and joints of unspecified foot: Secondary | ICD-10-CM | POA: Diagnosis not present

## 2011-11-07 DIAGNOSIS — D235 Other benign neoplasm of skin of trunk: Secondary | ICD-10-CM | POA: Diagnosis not present

## 2011-12-25 DIAGNOSIS — S52599A Other fractures of lower end of unspecified radius, initial encounter for closed fracture: Secondary | ICD-10-CM | POA: Diagnosis not present

## 2011-12-25 DIAGNOSIS — M67919 Unspecified disorder of synovium and tendon, unspecified shoulder: Secondary | ICD-10-CM | POA: Diagnosis not present

## 2012-01-14 DIAGNOSIS — I10 Essential (primary) hypertension: Secondary | ICD-10-CM | POA: Diagnosis not present

## 2012-01-17 DIAGNOSIS — E7800 Pure hypercholesterolemia, unspecified: Secondary | ICD-10-CM | POA: Diagnosis not present

## 2012-01-17 DIAGNOSIS — I10 Essential (primary) hypertension: Secondary | ICD-10-CM | POA: Diagnosis not present

## 2012-01-17 DIAGNOSIS — M810 Age-related osteoporosis without current pathological fracture: Secondary | ICD-10-CM | POA: Diagnosis not present

## 2012-01-17 DIAGNOSIS — F329 Major depressive disorder, single episode, unspecified: Secondary | ICD-10-CM | POA: Diagnosis not present

## 2012-01-22 DIAGNOSIS — M25539 Pain in unspecified wrist: Secondary | ICD-10-CM | POA: Diagnosis not present

## 2012-01-24 DIAGNOSIS — I6529 Occlusion and stenosis of unspecified carotid artery: Secondary | ICD-10-CM | POA: Diagnosis not present

## 2012-04-17 DIAGNOSIS — N390 Urinary tract infection, site not specified: Secondary | ICD-10-CM | POA: Diagnosis not present

## 2012-04-17 DIAGNOSIS — I10 Essential (primary) hypertension: Secondary | ICD-10-CM | POA: Diagnosis not present

## 2012-04-17 DIAGNOSIS — M810 Age-related osteoporosis without current pathological fracture: Secondary | ICD-10-CM | POA: Diagnosis not present

## 2012-04-22 DIAGNOSIS — I10 Essential (primary) hypertension: Secondary | ICD-10-CM | POA: Diagnosis not present

## 2012-04-22 DIAGNOSIS — F329 Major depressive disorder, single episode, unspecified: Secondary | ICD-10-CM | POA: Diagnosis not present

## 2012-04-22 DIAGNOSIS — M810 Age-related osteoporosis without current pathological fracture: Secondary | ICD-10-CM | POA: Diagnosis not present

## 2012-04-22 DIAGNOSIS — E7800 Pure hypercholesterolemia, unspecified: Secondary | ICD-10-CM | POA: Diagnosis not present

## 2012-04-29 DIAGNOSIS — M67919 Unspecified disorder of synovium and tendon, unspecified shoulder: Secondary | ICD-10-CM | POA: Diagnosis not present

## 2012-04-29 DIAGNOSIS — M719 Bursopathy, unspecified: Secondary | ICD-10-CM | POA: Diagnosis not present

## 2012-05-06 DIAGNOSIS — M19019 Primary osteoarthritis, unspecified shoulder: Secondary | ICD-10-CM | POA: Diagnosis not present

## 2012-05-07 DIAGNOSIS — I6529 Occlusion and stenosis of unspecified carotid artery: Secondary | ICD-10-CM | POA: Diagnosis not present

## 2012-05-07 DIAGNOSIS — Z951 Presence of aortocoronary bypass graft: Secondary | ICD-10-CM | POA: Diagnosis not present

## 2012-05-07 DIAGNOSIS — E782 Mixed hyperlipidemia: Secondary | ICD-10-CM | POA: Diagnosis not present

## 2012-05-07 DIAGNOSIS — I2510 Atherosclerotic heart disease of native coronary artery without angina pectoris: Secondary | ICD-10-CM | POA: Diagnosis not present

## 2012-06-02 ENCOUNTER — Encounter (HOSPITAL_BASED_OUTPATIENT_CLINIC_OR_DEPARTMENT_OTHER): Payer: Self-pay | Admitting: *Deleted

## 2012-06-02 NOTE — Progress Notes (Signed)
Pt hre 5/13 with orif foot-stayed [facility] -she saw dr Nadara Eaton 4/14 for ekg and cardiac clearance-called for notes-to try to come in for bmet-if not need [facility] To bring all meds and overnight bag in case she needs to stay Chronic cough for 30 yr-no real asthma

## 2012-06-04 ENCOUNTER — Encounter (HOSPITAL_BASED_OUTPATIENT_CLINIC_OR_DEPARTMENT_OTHER): Payer: Self-pay | Admitting: *Deleted

## 2012-06-04 NOTE — H&P (Signed)
[facility] [address]   [address] [place], [place] [address] [phone] A Division of [facility]  Loreta Ave, M.D.   Robert A. Thurston Hole, M.D.   Burnell Blanks, M.D.   Eulas Post, M.D.   [name], M.D Buford Dresser, M.D.  Charlsie Quest, M.D.   Estell Harpin, M.D.   Melina Fiddler, M.D. Genene Churn. Barry Dienes, PA-C            Kirstin A. Shepperson, PA-C Josh Rose Bud, PA-C [place], [place]   RE: [name]                                [phone]      DOB: 09/18/1976 PROGRESS NOTE: 03-18-12 28 three year-old white female who is status post right knee scope with debridement and PCL reconstruction on August 27, 2011.  Returns.  He did have FCE on February 25, 2012 and this indicates that he is able to work at the medium [employer] for an eight hour day.  Qualifies for frequent sitting, standing, walking, bending, reaching, squatting and climbing.  Limited to occasional crawling and qualifies for kneeling rarely.  Occasional material handling capabilities are 70 pounds floor to knuckle lift, 50 pounds overhead lift and 50 pounds carrying.  Said that he continues to have instability of his knee with walking and pivoting.  He has not attempted any aggressive activity.  Intermittently gets a sharp pain when he feels like his knee buckles.  He said that he would be interested in repeat surgery if offered.    EXAMINATION: Pleasant white female, alert and oriented x 3 and in no acute distress.  Gait is normal.  He continues to have obvious PCL laxity.  Medial and lateral joint line tenderness.  Minimal swelling without significant palpable effusion.  Calf non-tender.  Neurovascularly intact.  Skin warm and dry.   DISPOSITION:  We will go ahead and repeat his right knee MRI to assess his ACL and PCL reconstruction.  Follow up in the office after completion to discuss results.    Loreta Ave, M.D.   Electronically  verified by Loreta Ave, M.D. DFM(JMO):jjh Cc: Worker's Comp. D 03-18-12 T 03-19-12  [name]/[facility] [address]   [address] [place], [place] [address] [phone] A Division of [facility]  Loreta Ave, M.D.   Robert A. Thurston Hole, M.D.   Burnell Blanks, M.D.   Eulas Post, M.D.   [name], M.D Buford Dresser, M.D.  Charlsie Quest, M.D.   Estell Harpin, M.D.   Melina Fiddler, M.D. Genene Churn. Barry Dienes, PA-C            Kirstin A. Shepperson, PA-C Josh Craig, PA-C [place], [place]   RE: [name]                                [phone]      DOB: 09/18/1976 PROGRESS NOTE: 04-08-12 37 three year-old white female who returns for follow up of his MRI scan that was performed on April 04, 2012.  Scan read negative for meniscal or ligament tear.  Postoperative change of PCL grafting without evidence of complication identified.  Synovial prominence superior and posterior to the posterior horn of the medial meniscus is compatible with focal  synovitis.  Small Bakers cyst.  After actually reviewing his actual MRI films his PCL graft actually does show a considerable amount of laxity and sits in about a 90 degree position.  He continues to have ongoing instability of his knee.  We had previously discussed repeat surgery with knee arthroscopy with debridement and re-reconstruction of his PCL.  We feel that this is his only viable option at this point due to his ongoing symptoms.  Surgical procedure, along with potential rehab/recovery time again discussed.  We will plan to use an autograft hamstring and allograft anterior tib versus semitendinosus.  All questions answered.    Loreta Ave, M.D.   Electronically verified by Loreta Ave, M.D. DFM(JMO):jjh Cc: Worker's Comp. D 04-09-12

## 2012-06-04 NOTE — H&P (Signed)
[facility]illip [facility]ler/[facility] [address]   [address] [place], [place] [address] [phone] A Division of [facility]  Loreta Ave, M.D.   Robert A. Thurston Hole, M.D.   Burnell Blanks, M.D.   Eulas Post, M.D.   [name], M.D Buford Dresser, M.D.  Charlsie Quest, M.D.   Estell Harpin, M.D.   Melina Fiddler, M.D. Genene Churn. Barry Dienes, PA-C            Kirstin A. Shepperson, PA-C Josh [place]iston, PA-C [place], OPA-C   RE: [place], [name]                                [phone]      DOB: Sep 02, 1933 PROGRESS NOTE: 04-29-12 79 six year-old white female who returns to the office today with complaints of left shoulder pain.  As previously documented, she suffered a fall in November of 2013 and had a left wrist distal radius fracture, which has healed.  She also hurt her shoulder in the fall.  She continues to have pain with overhead activity and reaching behind her back.  This has progressively worsened over the last 2-3 weeks.  No new injury. Pain does awaken her at night when she lies on her left side.  No cervical spine or radicular component.    EXAMINATION: Pleasant elderly white female, alert and oriented x 3 and in no acute distress.  Left shoulder active flexion/abduction to about 110 degrees with discomfort.  She has good range of motion passively.  Negative drop arm.  Positive impingement test.  Pain and weakness with supraspinatus resistance.  Neurovascularly intact.  Skin warm and dry.    X-RAYS: Left shoulder, AP, outlet and axillary views, today show what appears to be calcific tendinopathy right at the greater tuberosity.  This is new from previous x-rays in November of 2013.  Type II acromion.  AC degenerative changes.   IMPRESSION: Left shoulder pain secondary to impingement and likely cuff tear.  Calcific tendinopathy seen on x-ray.    PLAN: We will schedule an MRI of the left shoulder to rule out rotator  cuff tear.  Follow up after completion to discuss results.    Loreta Ave, M.D.   Electronically verified by Loreta Ave, M.D. DFM(JMO):jjh D 04-29-12 T 04-30-12  Eythan Jayne/[facility] [address]   [address] [place], [place] [address] [phone] A Division of [facility]  Loreta Ave, M.D.   Robert A. Thurston Hole, M.D.   Burnell Blanks, M.D.   Eulas Post, M.D.   [name], M.D Buford Dresser, M.D.  Charlsie Quest, M.D.   Estell Harpin, M.D.   Melina Fiddler, M.D. Genene Churn. Barry Dienes, PA-C            Kirstin A. Shepperson, PA-C Josh [place], PA-C [place], OPA-C   RE: [place], [name]   [phone]      DOB: 05-31-1933 PROGRESS NOTE: 05-09-12 79 year old white female with left shoulder pain returns to review MRI performed 05/06/12. This showed full thickness partial width oblique tear of the distal supraspinatus tendon. Mild to moderate supraspinatus and infraspinatus tendinopathy with mild subscapularis tendinopathy. Expansion and abnormal increased signal in the intraarticular segment of the longhead of the biceps compatible with considerable tendinopathy or longitudinal tearing. Mild degenerative AC arthropathy. Symptoms unchanged from previous visit.  DISPOSITION:  She's advised only viable option would be left shoulder arthroscopy debridement subacromial decompression distal clavicle excision possible rotator cuff repair and biceps tendon release. Discussed risks benefits and possible complications. Shoulder immobilizer until 6 weeks post-op with a repair. Paperwork filled out.   Loreta Ave, M.D.  Electronically verified by Loreta Ave, M.D. DFM(JMO):kh D 05-12-12 T 05-13-12

## 2012-06-05 ENCOUNTER — Ambulatory Visit (HOSPITAL_BASED_OUTPATIENT_CLINIC_OR_DEPARTMENT_OTHER): Payer: Medicare Other | Admitting: Anesthesiology

## 2012-06-05 ENCOUNTER — Encounter (HOSPITAL_BASED_OUTPATIENT_CLINIC_OR_DEPARTMENT_OTHER): Payer: Self-pay | Admitting: Anesthesiology

## 2012-06-05 ENCOUNTER — Encounter (HOSPITAL_BASED_OUTPATIENT_CLINIC_OR_DEPARTMENT_OTHER)
Admission: RE | Disposition: A | Discharge status: Home / Self Care | Payer: Self-pay | Source: Ambulatory Visit | Attending: Orthopedic Surgery

## 2012-06-05 ENCOUNTER — Ambulatory Visit (HOSPITAL_BASED_OUTPATIENT_CLINIC_OR_DEPARTMENT_OTHER)
Admission: RE | Admit: 2012-06-05 | Discharge: 2012-06-05 | Disposition: A | Discharge status: Home / Self Care | Payer: Medicare Other | Source: Ambulatory Visit | Attending: Orthopedic Surgery | Admitting: Orthopedic Surgery

## 2012-06-05 DIAGNOSIS — M249 Joint derangement, unspecified: Secondary | ICD-10-CM | POA: Insufficient documentation

## 2012-06-05 DIAGNOSIS — M75120 Complete rotator cuff tear or rupture of unspecified shoulder, not specified as traumatic: Secondary | ICD-10-CM | POA: Diagnosis not present

## 2012-06-05 DIAGNOSIS — M25819 Other specified joint disorders, unspecified shoulder: Secondary | ICD-10-CM | POA: Diagnosis not present

## 2012-06-05 DIAGNOSIS — M719 Bursopathy, unspecified: Secondary | ICD-10-CM | POA: Insufficient documentation

## 2012-06-05 DIAGNOSIS — M899 Disorder of bone, unspecified: Secondary | ICD-10-CM | POA: Insufficient documentation

## 2012-06-05 DIAGNOSIS — Z9889 Other specified postprocedural states: Secondary | ICD-10-CM

## 2012-06-05 DIAGNOSIS — M24819 Other specific joint derangements of unspecified shoulder, not elsewhere classified: Secondary | ICD-10-CM | POA: Insufficient documentation

## 2012-06-05 DIAGNOSIS — M19019 Primary osteoarthritis, unspecified shoulder: Secondary | ICD-10-CM | POA: Insufficient documentation

## 2012-06-05 DIAGNOSIS — S43429A Sprain of unspecified rotator cuff capsule, initial encounter: Secondary | ICD-10-CM | POA: Diagnosis not present

## 2012-06-05 DIAGNOSIS — M67919 Unspecified disorder of synovium and tendon, unspecified shoulder: Secondary | ICD-10-CM | POA: Diagnosis not present

## 2012-06-05 DIAGNOSIS — G8918 Other acute postprocedural pain: Secondary | ICD-10-CM | POA: Diagnosis not present

## 2012-06-05 DIAGNOSIS — M66329 Spontaneous rupture of flexor tendons, unspecified upper arm: Secondary | ICD-10-CM | POA: Diagnosis not present

## 2012-06-05 DIAGNOSIS — M25519 Pain in unspecified shoulder: Secondary | ICD-10-CM | POA: Diagnosis not present

## 2012-06-05 HISTORY — PX: SHOULDER ARTHROSCOPY WITH SUBACROMIAL DECOMPRESSION, ROTATOR CUFF REPAIR AND BICEP TENDON REPAIR: SHX5687

## 2012-06-05 LAB — POCT I-STAT, CHEM 8
BUN: 19 mg/dL (ref 6–23)
Calcium, Ion: 1.14 mmol/L (ref 1.13–1.30)
Chloride: 103 mEq/L (ref 96–112)
Creatinine, Ser: 0.9 mg/dL (ref 0.50–1.10)
Glucose, Bld: 105 mg/dL — ABNORMAL HIGH (ref 70–99)
HCT: 42 % (ref 36.0–46.0)
Hemoglobin: 14.3 g/dL (ref 12.0–15.0)
Potassium: 3.5 mEq/L (ref 3.5–5.1)
Sodium: 141 mEq/L (ref 135–145)
TCO2: 29 mmol/L (ref 0–100)

## 2012-06-05 SURGERY — SHOULDER ARTHROSCOPY WITH SUBACROMIAL DECOMPRESSION, ROTATOR CUFF REPAIR AND BICEP TENDON REPAIR
Anesthesia: General | Site: Shoulder | Laterality: Left | Wound class: Clean

## 2012-06-05 MED ORDER — EPHEDRINE SULFATE 50 MG/ML IJ SOLN
INTRAMUSCULAR | Status: DC | PRN
  Administered 2012-06-05 (×3): 10 mg via INTRAVENOUS
  Administered 2012-06-05: 5 mg via INTRAVENOUS

## 2012-06-05 MED ORDER — DEXAMETHASONE SODIUM PHOSPHATE 4 MG/ML IJ SOLN
INTRAMUSCULAR | Status: DC | PRN
  Administered 2012-06-05: 8 mg via INTRAVENOUS

## 2012-06-05 MED ORDER — LACTATED RINGERS IV SOLN
INTRAVENOUS | Status: DC
  Administered 2012-06-05 (×2): via INTRAVENOUS

## 2012-06-05 MED ORDER — HYDROMORPHONE HCL PF 1 MG/ML IJ SOLN: 0.2500 mg | INTRAMUSCULAR | Status: DC | PRN

## 2012-06-05 MED ORDER — LIDOCAINE HCL (CARDIAC) 20 MG/ML IV SOLN
INTRAVENOUS | Status: DC | PRN
  Administered 2012-06-05: 100 mg via INTRAVENOUS

## 2012-06-05 MED ORDER — PROPOFOL 10 MG/ML IV BOLUS
INTRAVENOUS | Status: DC | PRN
  Administered 2012-06-05: 100 mg via INTRAVENOUS

## 2012-06-05 MED ORDER — ONDANSETRON HCL 4 MG/2ML IJ SOLN: 4.0000 mg | Freq: Once | INTRAMUSCULAR | Status: DC | PRN

## 2012-06-05 MED ORDER — FENTANYL CITRATE 0.05 MG/ML IJ SOLN
INTRAMUSCULAR | Status: DC | PRN
  Administered 2012-06-05: 50 ug via INTRAVENOUS

## 2012-06-05 MED ORDER — MIDAZOLAM HCL 2 MG/2ML IJ SOLN
1.0000 mg | INTRAMUSCULAR | Status: DC | PRN
  Administered 2012-06-05: 1 mg via INTRAVENOUS

## 2012-06-05 MED ORDER — OXYCODONE HCL 5 MG PO TABS: 5.0000 mg | ORAL_TABLET | Freq: Once | ORAL | Status: DC | PRN

## 2012-06-05 MED ORDER — ONDANSETRON HCL 4 MG/2ML IJ SOLN
INTRAMUSCULAR | Status: DC | PRN
  Administered 2012-06-05: 4 mg via INTRAVENOUS

## 2012-06-05 MED ORDER — TAPENTADOL HCL 50 MG PO TABS: 50.0000 mg | ORAL_TABLET | Freq: Four times a day (QID) | ORAL | Status: DC | PRN

## 2012-06-05 MED ORDER — PHENYLEPHRINE HCL 10 MG/ML IJ SOLN
INTRAMUSCULAR | Status: DC | PRN
  Administered 2012-06-05: 40 ug via INTRAVENOUS

## 2012-06-05 MED ORDER — SUCCINYLCHOLINE CHLORIDE 20 MG/ML IJ SOLN
INTRAMUSCULAR | Status: DC | PRN
  Administered 2012-06-05: 100 mg via INTRAVENOUS

## 2012-06-05 MED ORDER — CEFAZOLIN SODIUM-DEXTROSE 2-3 GM-% IV SOLR
2.0000 g | INTRAVENOUS | Status: AC
  Administered 2012-06-05: 2 g via INTRAVENOUS

## 2012-06-05 MED ORDER — FENTANYL CITRATE 0.05 MG/ML IJ SOLN
50.0000 ug | INTRAMUSCULAR | Status: DC | PRN
  Administered 2012-06-05: 50 ug via INTRAVENOUS

## 2012-06-05 MED ORDER — ALBUTEROL SULFATE HFA 108 (90 BASE) MCG/ACT IN AERS
INHALATION_SPRAY | RESPIRATORY_TRACT | Status: DC | PRN
  Administered 2012-06-05: 2 via RESPIRATORY_TRACT

## 2012-06-05 MED ORDER — OXYCODONE HCL 5 MG/5ML PO SOLN: 5.0000 mg | Freq: Once | ORAL | Status: DC | PRN

## 2012-06-05 MED ORDER — BUPIVACAINE-EPINEPHRINE PF 0.5-1:200000 % IJ SOLN
INTRAMUSCULAR | Status: DC | PRN
  Administered 2012-06-05: 23 mL

## 2012-06-05 SURGICAL SUPPLY — 76 items
ANCH SUT SWLK 19.1X5.5 CLS EL (Anchor) ×2 IMPLANT
ANCH SUT SWLK 19.1X6.25 CLS (Anchor) ×1 IMPLANT
ANCHOR PEEK SWIVEL LOCK 5.5 (Anchor) ×2 IMPLANT
ANCHOR SUT SWIVELLOK BIO (Anchor) ×1 IMPLANT
APL SKNCLS STERI-STRIP NONHPOA (GAUZE/BANDAGES/DRESSINGS)
BENZOIN TINCTURE PRP APPL 2/3 (GAUZE/BANDAGES/DRESSINGS) IMPLANT
BLADE CUTTER GATOR 3.5 (BLADE) ×2 IMPLANT
BLADE CUTTER MENIS 5.5 (BLADE) IMPLANT
BLADE GREAT WHITE 4.2 (BLADE) ×2 IMPLANT
BLADE SURG 15 STRL LF DISP TIS (BLADE) ×1 IMPLANT
BLADE SURG 15 STRL SS (BLADE) ×2
BUR OVAL 6.0 (BURR) ×2 IMPLANT
CANISTER OMNI JUG 16 LITER (MISCELLANEOUS) ×2 IMPLANT
CANISTER SUCTION 2500CC (MISCELLANEOUS) IMPLANT
CANNULA DRY DOC 8X75 (CANNULA) ×1 IMPLANT
CANNULA TWIST IN 8.25X7CM (CANNULA) IMPLANT
CLOTH BEACON ORANGE TIMEOUT ST (SAFETY) ×2 IMPLANT
DECANTER SPIKE VIAL GLASS SM (MISCELLANEOUS) IMPLANT
DRAPE STERI 35X30 U-POUCH (DRAPES) ×2 IMPLANT
DRAPE U-SHAPE 47X51 STRL (DRAPES) ×2 IMPLANT
DRAPE U-SHAPE 76X120 STRL (DRAPES) ×4 IMPLANT
DRSG PAD ABDOMINAL 8X10 ST (GAUZE/BANDAGES/DRESSINGS) ×2 IMPLANT
DURAPREP 26ML APPLICATOR (WOUND CARE) ×2 IMPLANT
ELECT MENISCUS 165MM 90D (ELECTRODE) ×2 IMPLANT
ELECT NDL TIP 2.8 STRL (NEEDLE) IMPLANT
ELECT NEEDLE TIP 2.8 STRL (NEEDLE) IMPLANT
ELECT REM PT RETURN 9FT ADLT (ELECTROSURGICAL) ×2
ELECTRODE REM PT RTRN 9FT ADLT (ELECTROSURGICAL) ×1 IMPLANT
GAUZE XEROFORM 1X8 LF (GAUZE/BANDAGES/DRESSINGS) ×2 IMPLANT
GLOVE BIOGEL PI IND STRL 8 (GLOVE) ×1 IMPLANT
GLOVE BIOGEL PI INDICATOR 8 (GLOVE) ×1
GLOVE ORTHO TXT STRL SZ7.5 (GLOVE) ×2 IMPLANT
GLOVE SKINSENSE NS SZ6.5 (GLOVE) ×1
GLOVE SKINSENSE NS SZ7.5 (GLOVE) ×3
GLOVE SKINSENSE STRL SZ6.5 (GLOVE) IMPLANT
GLOVE SKINSENSE STRL SZ7.5 (GLOVE) IMPLANT
GOWN PREVENTION PLUS XLARGE (GOWN DISPOSABLE) ×3 IMPLANT
GOWN STRL REIN 2XL XLG LVL4 (GOWN DISPOSABLE) ×2 IMPLANT
IV NS IRRIG 3000ML ARTHROMATIC (IV SOLUTION) ×8 IMPLANT
NDL SCORPION MULTI FIRE (NEEDLE) IMPLANT
NDL SUT 6 .5 CRC .975X.05 MAYO (NEEDLE) IMPLANT
NEEDLE MAYO TAPER (NEEDLE)
NEEDLE SCORPION MULTI FIRE (NEEDLE) ×2 IMPLANT
NS IRRIG 1000ML POUR BTL (IV SOLUTION) IMPLANT
PACK ARTHROSCOPY DSU (CUSTOM PROCEDURE TRAY) ×2 IMPLANT
PACK BASIN DAY SURGERY FS (CUSTOM PROCEDURE TRAY) ×2 IMPLANT
PASSER SUT SWANSON 36MM LOOP (INSTRUMENTS) IMPLANT
PENCIL BUTTON HOLSTER BLD 10FT (ELECTRODE) ×2 IMPLANT
SET ARTHROSCOPY TUBING (MISCELLANEOUS) ×2
SET ARTHROSCOPY TUBING LN (MISCELLANEOUS) ×1 IMPLANT
SLEEVE SCD COMPRESS KNEE MED (MISCELLANEOUS) ×1 IMPLANT
SLING ARM FOAM STRAP LRG (SOFTGOODS) IMPLANT
SLING ARM FOAM STRAP MED (SOFTGOODS) IMPLANT
SLING ARM FOAM STRAP XLG (SOFTGOODS) IMPLANT
SLING ARM IMMOBILIZER LRG (SOFTGOODS) ×1 IMPLANT
SLING ARM IMMOBILIZER MED (SOFTGOODS) IMPLANT
SPONGE GAUZE 4X4 12PLY (GAUZE/BANDAGES/DRESSINGS) ×4 IMPLANT
SPONGE LAP 4X18 X RAY DECT (DISPOSABLE) IMPLANT
STRIP CLOSURE SKIN 1/2X4 (GAUZE/BANDAGES/DRESSINGS) IMPLANT
SUCTION FRAZIER TIP 10 FR DISP (SUCTIONS) IMPLANT
SUT ETHIBOND 2 OS 4 DA (SUTURE) IMPLANT
SUT ETHILON 2 0 FS 18 (SUTURE) IMPLANT
SUT ETHILON 3 0 PS 1 (SUTURE) ×1 IMPLANT
SUT FIBERWIRE #2 38 T-5 BLUE (SUTURE)
SUT RETRIEVER MED (INSTRUMENTS) IMPLANT
SUT TIGER TAPE 7 IN WHITE (SUTURE) ×1 IMPLANT
SUT VIC AB 0 CT1 27 (SUTURE)
SUT VIC AB 0 CT1 27XBRD ANBCTR (SUTURE) IMPLANT
SUT VIC AB 2-0 SH 27 (SUTURE)
SUT VIC AB 2-0 SH 27XBRD (SUTURE) IMPLANT
SUT VIC AB 3-0 FS2 27 (SUTURE) IMPLANT
SUTURE FIBERWR #2 38 T-5 BLUE (SUTURE) IMPLANT
TAPE FIBER 2MM 7IN #2 BLUE (SUTURE) ×2 IMPLANT
TOWEL OR 17X24 6PK STRL BLUE (TOWEL DISPOSABLE) ×3 IMPLANT
WATER STERILE IRR 1000ML POUR (IV SOLUTION) ×2 IMPLANT
YANKAUER SUCT BULB TIP NO VENT (SUCTIONS) IMPLANT

## 2012-06-05 NOTE — Transfer of Care (Signed)
Immediate Anesthesia Transfer of Care Note  Patient: [name]  Procedure(s) Performed: Procedure(s): LEFT SHOULDER ARTHROSCOPY WITH SUBACROMIAL DECOMPRESSION, PARTIAL ACROMIOPLASTY WITH CORACOACROMIAL RELEASE, DISTAL CLAVICULECTOMY, WITH ROTATOR CUFF REPAIR AND BICEP TENODESIS (Left)  Patient Location: PACU  Anesthesia Type:General and GA combined with regional for post-op pain  Level of Consciousness: awake and alert   Airway & Oxygen Therapy: Patient Spontanous Breathing and Patient connected to face mask oxygen  Post-op Assessment: Report given to PACU RN and Post -op Vital signs reviewed and stable  Post vital signs: Reviewed and stable  Complications: No apparent anesthesia complications

## 2012-06-05 NOTE — Progress Notes (Signed)
  Assisted Dr. Crews with left, ultrasound guided, supraclavicular block. Side rails up, monitors on throughout procedure. See vital signs in flow sheet. Tolerated Procedure well. 

## 2012-06-05 NOTE — Interval H&P Note (Signed)
History and Physical Interval Note:  06/05/2012 7:27 AM  [name]  has presented today for surgery, with the diagnosis of LEFT SHOULDER INPINGEMENT SYNDROME, DEGENERATIVE ARTHRITIS, A/C JOINT RUPTURE OF ROTATOR CUFF, BICIPITAL TENOSYNOVITIS  The various methods of treatment have been discussed with the patient and family. After consideration of risks, benefits and other options for treatment, the patient has consented to  Procedure(s): LEFT SHOULDER ARTHROSCOPY WITH SUBACROMIAL DECOMPRESSION, PARTIAL ACROMIOPLASTY WITH CORACOACROMIAL RELEASE, DISTAL CLAVICULECTOMY, WITH ROTATOR CUFF REPAIR AND BICEP TENODESIS (Left) as a surgical intervention .  The patient's history has been reviewed, patient examined, no change in status, stable for surgery.  I have reviewed the patient's chart and labs.  Questions were answered to the patient's satisfaction.     Abigail Marsiglia F

## 2012-06-05 NOTE — Brief Op Note (Signed)
06/05/2012  3:30 PM  PATIENT:  Orpah Melter  79 y.o. female  PRE-OPERATIVE DIAGNOSIS:  LEFT SHOULDER INPINGEMENT SYNDROME, DEGENERATIVE ARTHRITIS, A/C JOINT RUPTURE OF ROTATOR CUFF, BICIPITAL TENOSYNOVITIS  POST-OPERATIVE DIAGNOSIS:  left shoulder impingement syndrome degenerative arthritis, a/c joint rupture of rotator cuff, bicipital tenosynovitis  PROCEDURE:  Procedure(s): LEFT SHOULDER ARTHROSCOPY WITH SUBACROMIAL DECOMPRESSION, PARTIAL ACROMIOPLASTY WITH CORACOACROMIAL RELEASE, DISTAL CLAVICULECTOMY, WITH ROTATOR CUFF REPAIR AND BICEP TENODESIS (Left)  SURGEON:  Surgeon(s) and Role:    * Loreta Ave, MD - Primary  PHYSICIAN ASSISTANT: Gyasi Hazzard M   ANESTHESIA:   general  EBL:  Total I/O In: 1444 [P.O.:444; I.V.:1000] Out: -   SPECIMEN:  No Specimen  DISPOSITION OF SPECIMEN:  N/A  COUNTS:  YES  TOURNIQUET:  * No tourniquets in log *     PATIENT DISPOSITION:  PACU - hemodynamically stable.

## 2012-06-05 NOTE — Anesthesia Postprocedure Evaluation (Signed)
  Anesthesia Post-op Note  Patient: [name]  Procedure(s) Performed: Procedure(s): LEFT SHOULDER ARTHROSCOPY WITH SUBACROMIAL DECOMPRESSION, PARTIAL ACROMIOPLASTY WITH CORACOACROMIAL RELEASE, DISTAL CLAVICULECTOMY, WITH ROTATOR CUFF REPAIR AND BICEP TENODESIS (Left)  Patient Location: PACU  Anesthesia Type:GA combined with regional for post-op pain  Level of Consciousness: awake, alert  and oriented  Airway and Oxygen Therapy: Patient Spontanous Breathing and Patient connected to face mask oxygen  Post-op Pain: none  Post-op Assessment: Post-op Vital signs reviewed  Post-op Vital Signs: Reviewed  Complications: No apparent anesthesia complications

## 2012-06-05 NOTE — Anesthesia Preprocedure Evaluation (Signed)
Anesthesia Evaluation  Patient identified by MRN, date of birth, ID band Patient awake    Reviewed: Allergy & Precautions, H&P , NPO status , Patient's Chart, lab work & pertinent test results, reviewed documented beta blocker date and time   Airway Mallampati: I TM Distance: >3 FB Neck ROM: Full    Dental  (+) Teeth Intact and Dental Advisory Given   Pulmonary  breath sounds clear to auscultation        Cardiovascular Exercise Tolerance: Good + CAD and + Peripheral Vascular Disease Rhythm:Regular Rate:Normal     Neuro/Psych    GI/Hepatic   Endo/Other    Renal/GU      Musculoskeletal   Abdominal   Peds  Hematology   Anesthesia Other Findings   Reproductive/Obstetrics                           Anesthesia Physical Anesthesia Plan  ASA: III  Anesthesia Plan: General   Post-op Pain Management:    Induction: Intravenous  Airway Management Planned: Oral ETT  Additional Equipment:   Intra-op Plan:   Post-operative Plan: Extubation in OR  Informed Consent: I have reviewed the patients History and Physical, chart, labs and discussed the procedure including the risks, benefits and alternatives for the proposed anesthesia with the patient or authorized representative who has indicated his/her understanding and acceptance.   Dental advisory given  Plan Discussed with: CRNA, Anesthesiologist and Surgeon  Anesthesia Plan Comments:         Anesthesia Quick Evaluation

## 2012-06-05 NOTE — Anesthesia Procedure Notes (Addendum)
Anesthesia Regional Block:  Interscalene brachial plexus block  Pre-Anesthetic Checklist: ,, timeout performed, Correct Patient, Correct Site, Correct Laterality, Correct Procedure, Correct Position, site marked, Risks and benefits discussed,  Surgical consent,  Pre-op evaluation,  At surgeon's request and post-op pain management  Laterality: Left and Upper  Prep: chloraprep       Needles:  Injection technique: Single-shot  Needle Type: Echogenic Needle     Needle Length: 5cm 5 cm Needle Gauge: 21    Additional Needles:  Procedures: ultrasound guided (picture in chart) Interscalene brachial plexus block Narrative:  Start time: 06/05/2012 8:27 AM End time: 06/05/2012 8:33 AM Injection made incrementally with aspirations every 5 mL.  Performed by: Personally  Anesthesiologist: Sheldon Silvan, MD  Interscalene brachial plexus block Procedure Name: Intubation Date/Time: 06/05/2012 9:06 AM Performed by: Caren Macadam Pre-anesthesia Checklist: Patient identified, Emergency Drugs available, Suction available and Patient being monitored Patient Re-evaluated:Patient Re-evaluated prior to inductionOxygen Delivery Method: Circle System Utilized Preoxygenation: Pre-oxygenation with 100% oxygen Intubation Type: IV induction Ventilation: Mask ventilation without difficulty Laryngoscope Size: Miller and 2 Grade View: Grade I Tube type: Oral Tube size: 7.0 mm Number of attempts: 1 Airway Equipment and Method: stylet and oral airway Placement Confirmation: ETT inserted through vocal cords under direct vision,  positive ETCO2 and breath sounds checked- equal and bilateral Secured at: 22 cm Tube secured with: Tape Dental Injury: Teeth and Oropharynx as per pre-operative assessment

## 2012-06-06 NOTE — Op Note (Signed)
Adrienne, [name]            ACCOUNT NO.:  [id number]  MEDICAL RECORD NO.:  [id number]  LOCATION:                                 FACILITY:  PHYSICIAN:  Loreta Ave, M.D. DATE OF BIRTH:  1933-07-07  DATE OF PROCEDURE:  06/05/2012 DATE OF DISCHARGE:                              OPERATIVE REPORT   PREOPERATIVE DIAGNOSIS:  Left shoulder complete rotator cuff tear. Attrition tendinopathy throughout.  Impingement.  Distal clavicle degenerative joint disease.  Reactive bursitis.  POSTOPERATIVE DIAGNOSIS:  Left shoulder complete rotator cuff tear. Attrition tendinopathy throughout.  Impingement.  Distal clavicle degenerative joint disease.  Reactive bursitis.  Complex circumferential labral tears.  Marked tearing into the tendinous intra-articular portion long head biceps tendon.  Grade 2 and 3 changes, glenohumeral joint. Extensive tearing of the lateral attachments at supraspinatus with also marked tearing more medially at the musculotendinous junction. Relatively extreme osteopenia in the humeral head.  PROCEDURE:  Left shoulder exam under anesthesia, arthroscopy. Debridement glenohumeral joint with chondroplasty, debridement of labrum.  Release resection intra-articular portion biceps allowing it to recess in the bicipital groove for auto-tenodesis.  Bursectomy, acromioplasty, CA ligament release.  Excision of distal clavicle. Arthroscopic-assisted rotator cuff repair, fiber weave suture x3, SwiveLock anchors x2 and a large tenodesis SwiveLock anchor x1.  SURGEON:  Loreta Ave, M.D.  ASSISTANT:  Genene Churn. Barry Dienes, [place], present throughout the entire case, necessary for timely completion of procedure.  ANESTHESIA:  General.  ESTIMATED BLOOD LOSS:  Minimal.  SPECIMENS:  None.  CULTURES:  None.  COMPLICATION:  None.  DRESSINGS:  Soft compressive shoulder immobilizer.  PROCEDURE IN DETAIL:  The patient was brought to operating room, placed on the operating table  in supine position.  After adequate anesthesia had been obtained, shoulder examined.  Moderate adhesive capsulitis missing about 20% of motion from adhesions.  Gently manipulated, achieving full motion stable shoulder.  Placed in a beach-chair position on the shoulder positioner, prepped and draped in usual sterile fashion. Three portals anteroposterior lateral.  Arthroscope introduced, shoulder distended and inspected.  Grade 2 and 3 changes, especially on the glenoid, debrided to a stable surface.  Circumferential labral tear was debrided.  Intra-articular portion long head biceps had marked tearing tendinopathy.  This was resected, released off the top of the glenoid, and allowed to recessing the bicipital groove.  Full-thickness tear of supraspinatus tendon from front to back extending into the top of the infraspinatus.  From below it was not apparent further medial tearing. Cannula were redirected subacromially.  Reactive bursitis debrided. Acromioplasty from type 3 to type 1 acromion releasing CA ligament to facilitate this.  Grade 4 changes spurring distal clavicle. Periarticular spurs lateral 1 cm of clavicle resected.  Adequacy of decompression confirmed viewing from all portals.  With a cannula laterally, I then captured with the cuff with 2 horizontal mattress sutures at the lateral tear.  Brought over to the roughened tuberosity. SwiveLock anchor in the front.  When I tried to put a SwiveLock anchor in the back, the bone was so osteoporotic, I could not get any purchase at all.  I was able to bring the sutures over the top of the tuberosity and anchor it  there.  A little tenuous, but I was able to get a stat repair.  Once the cuff had been pulled out laterally, I then looked a little bit more medially.  There was another large hole in the supraspinatus at the musculotendinous junction.  I was able to place a weaved fiber weave suture in that and bringing the sutures back over  top of the tuberosity by the lateral repair and then anchored into the side of the humerus with a 6.5 SwiveLock anchor.  Again, the bone not of the greatest quality, but at completion I did have a nice firm watertight closure of the entire cuff as well as adequate decompression. Instruments were completely removed.  Portals were closed with nylon. Sterile compressive dressing were applied.  Shoulder immobilizer applied.  Anesthesia reversed.  Brought to the recovery room.  Tolerated the surgery well with no complications.     Loreta Ave, M.D.     DFM/MEDQ  D:  06/05/2012  T:  06/06/2012  Job:  [id number]

## 2012-06-09 ENCOUNTER — Encounter (HOSPITAL_BASED_OUTPATIENT_CLINIC_OR_DEPARTMENT_OTHER): Payer: Self-pay | Admitting: Orthopedic Surgery

## 2012-06-13 ENCOUNTER — Other Ambulatory Visit: Payer: Self-pay | Admitting: Sports Medicine

## 2012-06-13 DIAGNOSIS — Z4789 Encounter for other orthopedic aftercare: Secondary | ICD-10-CM | POA: Diagnosis not present

## 2012-06-13 DIAGNOSIS — M8580 Other specified disorders of bone density and structure, unspecified site: Secondary | ICD-10-CM

## 2012-06-24 DIAGNOSIS — I6529 Occlusion and stenosis of unspecified carotid artery: Secondary | ICD-10-CM | POA: Diagnosis not present

## 2012-06-27 DIAGNOSIS — Z4789 Encounter for other orthopedic aftercare: Secondary | ICD-10-CM | POA: Diagnosis not present

## 2012-07-08 DIAGNOSIS — M25519 Pain in unspecified shoulder: Secondary | ICD-10-CM | POA: Diagnosis not present

## 2012-07-08 DIAGNOSIS — M75120 Complete rotator cuff tear or rupture of unspecified shoulder, not specified as traumatic: Secondary | ICD-10-CM | POA: Diagnosis not present

## 2012-07-11 DIAGNOSIS — M75120 Complete rotator cuff tear or rupture of unspecified shoulder, not specified as traumatic: Secondary | ICD-10-CM | POA: Diagnosis not present

## 2012-07-11 DIAGNOSIS — M25519 Pain in unspecified shoulder: Secondary | ICD-10-CM | POA: Diagnosis not present

## 2012-07-17 DIAGNOSIS — M25519 Pain in unspecified shoulder: Secondary | ICD-10-CM | POA: Diagnosis not present

## 2012-07-17 DIAGNOSIS — M75120 Complete rotator cuff tear or rupture of unspecified shoulder, not specified as traumatic: Secondary | ICD-10-CM | POA: Diagnosis not present

## 2012-07-18 ENCOUNTER — Ambulatory Visit
Admission: RE | Admit: 2012-07-18 | Discharge: 2012-07-18 | Disposition: A | Discharge status: Home / Self Care | Payer: Medicare Other | Source: Ambulatory Visit | Attending: Sports Medicine | Admitting: Sports Medicine

## 2012-07-18 ENCOUNTER — Ambulatory Visit: Admission: RE | Admit: 2012-07-18 | Payer: Medicare Other | Source: Ambulatory Visit

## 2012-07-18 DIAGNOSIS — M8580 Other specified disorders of bone density and structure, unspecified site: Secondary | ICD-10-CM

## 2012-07-21 ENCOUNTER — Ambulatory Visit
Admission: RE | Admit: 2012-07-21 | Discharge: 2012-07-21 | Disposition: A | Discharge status: Home / Self Care | Payer: Medicare Other | Source: Ambulatory Visit | Attending: Sports Medicine | Admitting: Sports Medicine

## 2012-07-21 ENCOUNTER — Other Ambulatory Visit: Payer: Self-pay | Admitting: Orthopedic Surgery

## 2012-07-21 DIAGNOSIS — R5381 Other malaise: Secondary | ICD-10-CM | POA: Diagnosis not present

## 2012-07-21 DIAGNOSIS — M949 Disorder of cartilage, unspecified: Secondary | ICD-10-CM | POA: Diagnosis not present

## 2012-07-21 DIAGNOSIS — M899 Disorder of bone, unspecified: Secondary | ICD-10-CM | POA: Diagnosis not present

## 2012-07-21 DIAGNOSIS — R5383 Other fatigue: Secondary | ICD-10-CM | POA: Diagnosis not present

## 2012-07-21 DIAGNOSIS — M25519 Pain in unspecified shoulder: Secondary | ICD-10-CM | POA: Diagnosis not present

## 2012-07-21 DIAGNOSIS — M8580 Other specified disorders of bone density and structure, unspecified site: Secondary | ICD-10-CM

## 2012-07-21 DIAGNOSIS — E559 Vitamin D deficiency, unspecified: Secondary | ICD-10-CM | POA: Diagnosis not present

## 2012-07-21 DIAGNOSIS — M75120 Complete rotator cuff tear or rupture of unspecified shoulder, not specified as traumatic: Secondary | ICD-10-CM | POA: Diagnosis not present

## 2012-07-21 DIAGNOSIS — M810 Age-related osteoporosis without current pathological fracture: Secondary | ICD-10-CM | POA: Diagnosis not present

## 2012-07-23 DIAGNOSIS — M25519 Pain in unspecified shoulder: Secondary | ICD-10-CM | POA: Diagnosis not present

## 2012-07-23 DIAGNOSIS — M75120 Complete rotator cuff tear or rupture of unspecified shoulder, not specified as traumatic: Secondary | ICD-10-CM | POA: Diagnosis not present

## 2012-07-24 DIAGNOSIS — M810 Age-related osteoporosis without current pathological fracture: Secondary | ICD-10-CM | POA: Diagnosis not present

## 2012-07-25 DIAGNOSIS — R7309 Other abnormal glucose: Secondary | ICD-10-CM | POA: Diagnosis not present

## 2012-07-25 DIAGNOSIS — I10 Essential (primary) hypertension: Secondary | ICD-10-CM | POA: Diagnosis not present

## 2012-07-28 DIAGNOSIS — E782 Mixed hyperlipidemia: Secondary | ICD-10-CM | POA: Diagnosis not present

## 2012-07-28 DIAGNOSIS — I10 Essential (primary) hypertension: Secondary | ICD-10-CM | POA: Diagnosis not present

## 2012-07-28 DIAGNOSIS — I2510 Atherosclerotic heart disease of native coronary artery without angina pectoris: Secondary | ICD-10-CM | POA: Diagnosis not present

## 2012-07-28 DIAGNOSIS — I6529 Occlusion and stenosis of unspecified carotid artery: Secondary | ICD-10-CM | POA: Diagnosis not present

## 2012-07-29 DIAGNOSIS — M25519 Pain in unspecified shoulder: Secondary | ICD-10-CM | POA: Diagnosis not present

## 2012-07-29 DIAGNOSIS — M75120 Complete rotator cuff tear or rupture of unspecified shoulder, not specified as traumatic: Secondary | ICD-10-CM | POA: Diagnosis not present

## 2012-07-31 DIAGNOSIS — M75120 Complete rotator cuff tear or rupture of unspecified shoulder, not specified as traumatic: Secondary | ICD-10-CM | POA: Diagnosis not present

## 2012-07-31 DIAGNOSIS — M25519 Pain in unspecified shoulder: Secondary | ICD-10-CM | POA: Diagnosis not present

## 2012-08-01 DIAGNOSIS — R7309 Other abnormal glucose: Secondary | ICD-10-CM | POA: Diagnosis not present

## 2012-08-01 DIAGNOSIS — E7800 Pure hypercholesterolemia, unspecified: Secondary | ICD-10-CM | POA: Diagnosis not present

## 2012-08-01 DIAGNOSIS — I10 Essential (primary) hypertension: Secondary | ICD-10-CM | POA: Diagnosis not present

## 2012-08-01 DIAGNOSIS — I2510 Atherosclerotic heart disease of native coronary artery without angina pectoris: Secondary | ICD-10-CM | POA: Diagnosis not present

## 2012-08-05 DIAGNOSIS — I6529 Occlusion and stenosis of unspecified carotid artery: Secondary | ICD-10-CM

## 2012-08-05 DIAGNOSIS — IMO0001 Reserved for inherently not codable concepts without codable children: Secondary | ICD-10-CM

## 2012-08-05 DIAGNOSIS — M75120 Complete rotator cuff tear or rupture of unspecified shoulder, not specified as traumatic: Secondary | ICD-10-CM | POA: Diagnosis not present

## 2012-08-05 DIAGNOSIS — M25519 Pain in unspecified shoulder: Secondary | ICD-10-CM | POA: Diagnosis not present

## 2012-08-05 HISTORY — DX: Occlusion and stenosis of unspecified carotid artery: I65.29

## 2012-08-05 HISTORY — DX: Reserved for inherently not codable concepts without codable children: IMO0001

## 2012-08-07 DIAGNOSIS — M25519 Pain in unspecified shoulder: Secondary | ICD-10-CM | POA: Diagnosis not present

## 2012-08-07 DIAGNOSIS — M75120 Complete rotator cuff tear or rupture of unspecified shoulder, not specified as traumatic: Secondary | ICD-10-CM | POA: Diagnosis not present

## 2012-08-12 DIAGNOSIS — M75120 Complete rotator cuff tear or rupture of unspecified shoulder, not specified as traumatic: Secondary | ICD-10-CM | POA: Diagnosis not present

## 2012-08-12 DIAGNOSIS — Z4789 Encounter for other orthopedic aftercare: Secondary | ICD-10-CM | POA: Diagnosis not present

## 2012-08-12 DIAGNOSIS — M25519 Pain in unspecified shoulder: Secondary | ICD-10-CM | POA: Diagnosis not present

## 2012-08-13 ENCOUNTER — Other Ambulatory Visit: Payer: Self-pay | Admitting: *Deleted

## 2012-08-13 ENCOUNTER — Encounter: Payer: Self-pay | Admitting: Vascular Surgery

## 2012-08-14 ENCOUNTER — Encounter: Payer: Self-pay | Admitting: Vascular Surgery

## 2012-08-14 ENCOUNTER — Ambulatory Visit (INDEPENDENT_AMBULATORY_CARE_PROVIDER_SITE_OTHER): Payer: Medicare Other | Admitting: Vascular Surgery

## 2012-08-14 ENCOUNTER — Other Ambulatory Visit (INDEPENDENT_AMBULATORY_CARE_PROVIDER_SITE_OTHER): Payer: Medicare Other | Admitting: *Deleted

## 2012-08-14 DIAGNOSIS — E559 Vitamin D deficiency, unspecified: Secondary | ICD-10-CM | POA: Diagnosis not present

## 2012-08-14 DIAGNOSIS — M810 Age-related osteoporosis without current pathological fracture: Secondary | ICD-10-CM | POA: Diagnosis not present

## 2012-08-14 DIAGNOSIS — Z01810 Encounter for preprocedural cardiovascular examination: Secondary | ICD-10-CM

## 2012-08-14 DIAGNOSIS — I6529 Occlusion and stenosis of unspecified carotid artery: Secondary | ICD-10-CM

## 2012-08-14 NOTE — Progress Notes (Signed)
History of Present Illness:  Patient is a 79 y.o. year old female who presents for evaluation of carotid stenosis.  This has been followed with serial duplex since 2007.  The patient denies symptoms of TIA, amaurosis, or stroke.  The patient is currently on aspirin antiplatelet therapy.  The carotid stenosis was found on pre CABG workup.   It has now progressed to high grade.    Other medical problems include coronary artery disease, hypercholesterolemia.  These are currently stable and followed by Dr Nadara Eaton.  She has a stress test scheduled next week.  Past Medical History  Diagnosis Date  . Coronary artery disease   . Hypercholesteremia   . Chronic cough   . Arthritis     Past Surgical History  Procedure Laterality Date  . Abdominal hysterectomy    . Appendectomy    . Eye surgery      both cataracts  . Orif ankle fracture  06/14/2011    Procedure: OPEN REDUCTION INTERNAL FIXATION (ORIF) ANKLE FRACTURE;  Surgeon: Loreta Ave, MD;  Location: [facility];  Service: Orthopedics;  Laterality: Left;  left ankle fracture open treatment trimalleolar ankle includes internal fixation WITHOUT fixation of posterior lip  . Coronary artery bypass graft    . Shoulder arthroscopy with subacromial decompression, rotator cuff repair and bicep tendon repair Left 06/05/2012    Procedure: LEFT SHOULDER ARTHROSCOPY WITH SUBACROMIAL DECOMPRESSION, PARTIAL ACROMIOPLASTY WITH CORACOACROMIAL RELEASE, DISTAL CLAVICULECTOMY, WITH ROTATOR CUFF REPAIR AND BICEP TENODESIS;  Surgeon: Loreta Ave, MD;  Location: [facility];  Service: Orthopedics;  Laterality: Left;     Social History History  Substance Use Topics  . Smoking status: Never Smoker   . Smokeless tobacco: Not on file  . Alcohol Use: No    Family History History reviewed. No pertinent family history.  Allergies  Allergies  Allergen Reactions  . Hydrocodone Shortness Of Breath    Rash-itching-tongue swelled  .  Oxycodone Swelling    Rash-itching  . Latex Rash     Current Outpatient Prescriptions  Medication Sig Dispense Refill  . aspirin 81 MG tablet Take 81 mg by mouth daily.      . Cholecalciferol (VITAMIN D PO) Take 1 capsule by mouth daily.      . citalopram (CELEXA) 20 MG tablet Take 1 tablet by mouth daily.      . fenofibrate micronized (ANTARA) 43 MG capsule Take 1 capsule by mouth daily.      . hydrochlorothiazide (HYDRODIURIL) 25 MG tablet Take 25 mg by mouth daily.      Marland Kitchen losartan (COZAAR) 100 MG tablet Take 100 mg by mouth daily.      . metoprolol tartrate (LOPRESSOR) 25 MG tablet Take 25 mg by mouth 2 (two) times daily.      . Multiple Vitamin (MULITIVITAMIN WITH MINERALS) TABS Take 1 tablet by mouth daily.      . rosuvastatin (CRESTOR) 10 MG tablet Take 10 mg by mouth every evening.      Marland Kitchen albuterol (PROVENTIL HFA;VENTOLIN HFA) 108 (90 BASE) MCG/ACT inhaler Inhale 2 puffs into the lungs every 6 (six) hours as needed. For wheezing/cough      . tapentadol (NUCYNTA) 50 MG TABS Take 1 tablet (50 mg total) by mouth every 6 (six) hours as needed.    0   No current facility-administered medications for this visit.    ROS:   General:  No weight loss, Fever, chills  HEENT: No recent headaches, no nasal bleeding,  no visual changes, no sore throat  Neurologic: No dizziness, blackouts, seizures. No recent symptoms of stroke or mini- stroke. No recent episodes of slurred speech, or temporary blindness.  Cardiac: No recent episodes of chest pain/pressure, no shortness of breath at rest.  No shortness of breath with exertion.  Denies history of atrial fibrillation or irregular heartbeat  Vascular: No history of rest pain in feet.  No history of claudication.  No history of non-healing ulcer, No history of DVT   Pulmonary: No home oxygen, no productive cough, no hemoptysis,  No asthma or wheezing  Musculoskeletal:  [ ]  Arthritis, [ ]  Low back pain,  [ ]  Joint pain  Hematologic:No history  of hypercoagulable state.  No history of easy bleeding.  No history of anemia  Gastrointestinal: No hematochezia or melena,  No gastroesophageal reflux, no trouble swallowing  Urinary: [ ]  chronic Kidney disease, [ ]  on HD - [ ]  MWF or [ ]  TTHS, [ ]  Burning with urination, [ ]  Frequent urination, [ ]  Difficulty urinating;   Skin: No rashes  Psychological: No history of anxiety,  No history of depression   Physical Examination  Filed Vitals:   08/14/12 1448 08/14/12 1453  BP: 120/60 137/59  Pulse: 64   Height: 5\' 2"  (1.575 m)   Weight: 162 lb 12.8 oz (73.846 kg)   SpO2: 93%     Body mass index is 29.77 kg/(m^2).  General:  Alert and oriented, no acute distress HEENT: Normal Neck: No bruit or JVD Pulmonary: Clear to auscultation bilaterally Cardiac: Regular Rate and Rhythm without murmur Gastrointestinal: Soft, non-tender, non-distended, no mass Skin: No rash Extremity Pulses:  2+ radial, brachial, femoral, absent dorsalis pedis, 2+ posterior tibial pulses bilaterally Musculoskeletal: No deformity or edema  Neurologic: Upper and lower extremity motor 5/5 and symmetric  DATA: Carotid duplex today.  I reviewed and interpreted the study.  PSV 305 EDV 63 high bifurcation, prior study 50-70% left, 338/98 right   ASSESSMENT: Asymptomatic Right ICA stenosis high grade with moderate left.  High bifurcation.   PLAN:  Right CEA 08/25/12.  Stress test next week. Continue aspirin.   I discussed with the patient the risks of carotid endarterectomy including but not limited to myocardial events 5%, cranial nerve injury 10-15%, stroke 1-2%, bleeding or infection 1%  I also discussed the benefits of long term stroke prevention and the advantage compared to medical therapy  The patient is aware of the risks and agrees to proceed forward with the procedure.   Fabienne Bruns, MD [facility] [place] Office: [phone] Pager: [phone]

## 2012-08-15 DIAGNOSIS — M25519 Pain in unspecified shoulder: Secondary | ICD-10-CM | POA: Diagnosis not present

## 2012-08-15 DIAGNOSIS — M75120 Complete rotator cuff tear or rupture of unspecified shoulder, not specified as traumatic: Secondary | ICD-10-CM | POA: Diagnosis not present

## 2012-08-15 DIAGNOSIS — Z4789 Encounter for other orthopedic aftercare: Secondary | ICD-10-CM | POA: Diagnosis not present

## 2012-08-18 ENCOUNTER — Other Ambulatory Visit: Payer: Self-pay

## 2012-08-18 ENCOUNTER — Encounter (HOSPITAL_COMMUNITY): Payer: Self-pay | Admitting: Pharmacy Technician

## 2012-08-18 DIAGNOSIS — I2510 Atherosclerotic heart disease of native coronary artery without angina pectoris: Secondary | ICD-10-CM | POA: Diagnosis not present

## 2012-08-18 DIAGNOSIS — I10 Essential (primary) hypertension: Secondary | ICD-10-CM | POA: Diagnosis not present

## 2012-08-19 DIAGNOSIS — M75120 Complete rotator cuff tear or rupture of unspecified shoulder, not specified as traumatic: Secondary | ICD-10-CM | POA: Diagnosis not present

## 2012-08-19 DIAGNOSIS — M25519 Pain in unspecified shoulder: Secondary | ICD-10-CM | POA: Diagnosis not present

## 2012-08-22 ENCOUNTER — Encounter: Payer: Self-pay | Admitting: Cardiology

## 2012-08-22 ENCOUNTER — Ambulatory Visit (HOSPITAL_COMMUNITY)
Admission: RE | Admit: 2012-08-22 | Discharge: 2012-08-22 | Disposition: A | Discharge status: Home / Self Care | Payer: Medicare Other | Source: Ambulatory Visit | Attending: Anesthesiology | Admitting: Anesthesiology

## 2012-08-22 ENCOUNTER — Encounter (HOSPITAL_COMMUNITY): Payer: Self-pay

## 2012-08-22 ENCOUNTER — Encounter (HOSPITAL_COMMUNITY)
Admission: RE | Admit: 2012-08-22 | Discharge: 2012-08-22 | Disposition: A | Discharge status: Home / Self Care | Payer: Medicare Other | Source: Ambulatory Visit | Attending: Vascular Surgery | Admitting: Vascular Surgery

## 2012-08-22 DIAGNOSIS — Z0183 Encounter for blood typing: Secondary | ICD-10-CM | POA: Insufficient documentation

## 2012-08-22 DIAGNOSIS — M25519 Pain in unspecified shoulder: Secondary | ICD-10-CM | POA: Diagnosis not present

## 2012-08-22 DIAGNOSIS — I2510 Atherosclerotic heart disease of native coronary artery without angina pectoris: Secondary | ICD-10-CM | POA: Insufficient documentation

## 2012-08-22 DIAGNOSIS — Z01818 Encounter for other preprocedural examination: Secondary | ICD-10-CM | POA: Insufficient documentation

## 2012-08-22 DIAGNOSIS — Z951 Presence of aortocoronary bypass graft: Secondary | ICD-10-CM | POA: Insufficient documentation

## 2012-08-22 DIAGNOSIS — Z01812 Encounter for preprocedural laboratory examination: Secondary | ICD-10-CM | POA: Insufficient documentation

## 2012-08-22 DIAGNOSIS — M75120 Complete rotator cuff tear or rupture of unspecified shoulder, not specified as traumatic: Secondary | ICD-10-CM | POA: Diagnosis not present

## 2012-08-22 HISTORY — DX: Reserved for inherently not codable concepts without codable children: IMO0001

## 2012-08-22 HISTORY — DX: Occlusion and stenosis of unspecified carotid artery: I65.29

## 2012-08-22 HISTORY — DX: Peripheral vascular disease, unspecified: I73.9

## 2012-08-22 LAB — URINALYSIS, ROUTINE W REFLEX MICROSCOPIC
Bilirubin Urine: NEGATIVE
Glucose, UA: NEGATIVE mg/dL
Hgb urine dipstick: NEGATIVE
Ketones, ur: NEGATIVE mg/dL
Nitrite: NEGATIVE
Protein, ur: NEGATIVE mg/dL
Specific Gravity, Urine: 1.017 (ref 1.005–1.030)
Urobilinogen, UA: 0.2 mg/dL (ref 0.0–1.0)
pH: 6.5 (ref 5.0–8.0)

## 2012-08-22 LAB — URINE MICROSCOPIC-ADD ON

## 2012-08-22 LAB — CBC
HCT: 42.3 % (ref 36.0–46.0)
Hemoglobin: 14.4 g/dL (ref 12.0–15.0)
MCH: 32.7 pg (ref 26.0–34.0)
MCHC: 34 g/dL (ref 30.0–36.0)
MCV: 96.1 fL (ref 78.0–100.0)
Platelets: 261 10*3/uL (ref 150–400)
RBC: 4.4 MIL/uL (ref 3.87–5.11)
RDW: 12.1 % (ref 11.5–15.5)
WBC: 6.4 10*3/uL (ref 4.0–10.5)

## 2012-08-22 LAB — COMPREHENSIVE METABOLIC PANEL
ALT: 12 U/L (ref 0–35)
AST: 18 U/L (ref 0–37)
Albumin: 4 g/dL (ref 3.5–5.2)
Alkaline Phosphatase: 50 U/L (ref 39–117)
BUN: 13 mg/dL (ref 6–23)
CO2: 32 mEq/L (ref 19–32)
Calcium: 10.5 mg/dL (ref 8.4–10.5)
Chloride: 100 mEq/L (ref 96–112)
Creatinine, Ser: 0.85 mg/dL (ref 0.50–1.10)
GFR calc Af Amer: 75 mL/min — ABNORMAL LOW (ref >90)
GFR calc non Af Amer: 65 mL/min — ABNORMAL LOW (ref >90)
Glucose, Bld: 100 mg/dL — ABNORMAL HIGH (ref 70–99)
Potassium: 3.8 mEq/L (ref 3.5–5.1)
Sodium: 140 mEq/L (ref 135–145)
Total Bilirubin: 0.3 mg/dL (ref 0.3–1.2)
Total Protein: 7.2 g/dL (ref 6.0–8.3)

## 2012-08-22 LAB — APTT: aPTT: 29 seconds (ref 24–37)

## 2012-08-22 LAB — PROTIME-INR
INR: 0.94 (ref 0.00–1.49)
Prothrombin Time: 12.4 seconds (ref 11.6–15.2)

## 2012-08-22 LAB — SURGICAL PCR SCREEN
MRSA, PCR: NEGATIVE
Staphylococcus aureus: NEGATIVE

## 2012-08-22 LAB — TYPE AND SCREEN
ABO/RH(D): A POS
Antibody Screen: NEGATIVE

## 2012-08-22 NOTE — Pre-Procedure Instructions (Signed)
[place]  08/22/2012   Your procedure is scheduled on:  Monday, July 21  Report to [facility] at 0530 AM.  Call this number if you have problems the morning of surgery: [phone]   Remember:   Do not eat food or drink liquids after midnight.Sunday night   Take these medicines the morning of surgery with A SIP OF WATER: inhaler as needed,metoprolol   Do not wear jewelry, make-up or nail polish.  Do not wear lotions, powders, or perfumes. Do not wear deodorant.  Do not shave 48 hours prior to surgery. Men may shave face and neck.  Do not bring valuables to the hospital.  [facility] is not responsible  for any belongings or valuables.  Contacts, dentures or bridgework may not be worn into surgery.  Leave suitcase in the car. After surgery it may be brought to your room.  For patients admitted to the hospital, checkout time is 11:00 AM the day of  discharge.      Special Instructions: Shower using CHG 2 nights before surgery and the night before surgery.  If you shower the day of surgery use CHG.  Use special wash - you have one bottle of CHG for all showers.  You should use approximately 1/3 of the bottle for each shower.   Please read over the following fact sheets that you were given: Pain Booklet, Coughing and Deep Breathing, Blood Transfusion Information and Surgical Site Infection Prevention

## 2012-08-22 NOTE — Progress Notes (Signed)
Anesthesia Chart Review:  Patient is a 79 year old female scheduled for right CEA on 08/25/12 by Dr. Darrick Penna.  She has high grade right ICAS and moderate 50-69% left ICAS by recent carotid duplex.  Other history includes non-smoker, CAD s/p CABG (LIMA to LAD, SVG to OM, SVG to PDA) '07, hypercholesterolemia, PAD, arthritis.  PCP is listed as Dr. Pearson Grippe. Cardiologist is Dr. Jacinto Halim.  Patient had a nuclear stress test at [facility] on 08/18/12.  Results showed normal isotope uptake at both rest and stress.  No evidence of ischemia or scar.  Normal wall motion and endocardial thickening.  LVEF estimated at 88%.  Based on these results, Dr. Jacinto Halim felt patient could proceed with surgery with acceptable (low) CV morbidity and mortality.  The last echo noted was from 06/18/05 and showed EF 60%, mildly calcified aortic valve, at least mild MR (not well visualized).   EKG on 07/28/12 (Dr. Jacinto Halim) showed NSR.  CXR on 08/22/12 showed post CABG.  No acute abnormalities.  Preoperative labs noted.  Anticipate that she can proceed as planned.  [facility]/Anesthesiology Phone [phone] 08/22/2012 3:51 PM

## 2012-08-24 MED ORDER — DEXTROSE 5 % IV SOLN
1.5000 g | INTRAVENOUS | Status: AC
  Administered 2012-08-25: 1.5 g via INTRAVENOUS
  Filled 2012-08-24: qty 1.5

## 2012-08-25 ENCOUNTER — Encounter (HOSPITAL_COMMUNITY)
Admission: RE | Disposition: A | Discharge status: Home / Self Care | Payer: Self-pay | Source: Ambulatory Visit | Attending: Vascular Surgery

## 2012-08-25 ENCOUNTER — Inpatient Hospital Stay (HOSPITAL_COMMUNITY): Payer: Medicare Other | Admitting: Anesthesiology

## 2012-08-25 ENCOUNTER — Inpatient Hospital Stay (HOSPITAL_COMMUNITY)
Admission: RE | Admit: 2012-08-25 | Discharge: 2012-08-27 | DRG: 038 | Disposition: A | Discharge status: Home / Self Care | Payer: Medicare Other | Source: Ambulatory Visit | Attending: Vascular Surgery | Admitting: Vascular Surgery

## 2012-08-25 ENCOUNTER — Encounter (HOSPITAL_COMMUNITY): Payer: Self-pay | Admitting: Vascular Surgery

## 2012-08-25 ENCOUNTER — Encounter (HOSPITAL_COMMUNITY): Payer: Self-pay | Admitting: *Deleted

## 2012-08-25 DIAGNOSIS — D62 Acute posthemorrhagic anemia: Secondary | ICD-10-CM | POA: Diagnosis not present

## 2012-08-25 DIAGNOSIS — E7800 Pure hypercholesterolemia, unspecified: Secondary | ICD-10-CM | POA: Diagnosis present

## 2012-08-25 DIAGNOSIS — I6529 Occlusion and stenosis of unspecified carotid artery: Principal | ICD-10-CM | POA: Diagnosis present

## 2012-08-25 DIAGNOSIS — I959 Hypotension, unspecified: Secondary | ICD-10-CM | POA: Diagnosis not present

## 2012-08-25 DIAGNOSIS — I2510 Atherosclerotic heart disease of native coronary artery without angina pectoris: Secondary | ICD-10-CM | POA: Diagnosis not present

## 2012-08-25 DIAGNOSIS — M129 Arthropathy, unspecified: Secondary | ICD-10-CM | POA: Diagnosis present

## 2012-08-25 DIAGNOSIS — Z951 Presence of aortocoronary bypass graft: Secondary | ICD-10-CM | POA: Diagnosis not present

## 2012-08-25 DIAGNOSIS — I739 Peripheral vascular disease, unspecified: Secondary | ICD-10-CM | POA: Diagnosis not present

## 2012-08-25 HISTORY — PX: CAROTID ENDARTERECTOMY: SUR193

## 2012-08-25 HISTORY — PX: ENDARTERECTOMY: SHX5162

## 2012-08-25 LAB — CBC
HCT: 35.9 % — ABNORMAL LOW (ref 36.0–46.0)
Hemoglobin: 12.6 g/dL (ref 12.0–15.0)
MCH: 33.5 pg (ref 26.0–34.0)
MCHC: 35.1 g/dL (ref 30.0–36.0)
MCV: 95.5 fL (ref 78.0–100.0)
Platelets: 184 10*3/uL (ref 150–400)
RBC: 3.76 MIL/uL — ABNORMAL LOW (ref 3.87–5.11)
RDW: 12 % (ref 11.5–15.5)
WBC: 6.4 10*3/uL (ref 4.0–10.5)

## 2012-08-25 LAB — CREATININE, SERUM
Creatinine, Ser: 0.73 mg/dL (ref 0.50–1.10)
GFR calc Af Amer: >90 mL/min (ref >90)
GFR calc non Af Amer: 81 mL/min — ABNORMAL LOW (ref >90)

## 2012-08-25 SURGERY — ENDARTERECTOMY, CAROTID
Anesthesia: General | Site: Neck | Laterality: Right | Wound class: Clean

## 2012-08-25 MED ORDER — HYDRALAZINE HCL 20 MG/ML IJ SOLN: 10.0000 mg | INTRAMUSCULAR | Status: DC | PRN

## 2012-08-25 MED ORDER — GUAIFENESIN-DM 100-10 MG/5ML PO SYRP
15.0000 mL | ORAL_SOLUTION | ORAL | Status: DC | PRN
  Administered 2012-08-26 – 2012-08-27 (×2): 15 mL via ORAL
  Filled 2012-08-25 (×2): qty 15

## 2012-08-25 MED ORDER — LACTATED RINGERS IV SOLN
INTRAVENOUS | Status: DC | PRN
  Administered 2012-08-25 (×2): via INTRAVENOUS

## 2012-08-25 MED ORDER — SODIUM CHLORIDE 0.9 % IV SOLN
500.0000 mL | Freq: Once | INTRAVENOUS | Status: AC | PRN
  Administered 2012-08-25: 500 mL via INTRAVENOUS

## 2012-08-25 MED ORDER — ONDANSETRON HCL 4 MG/2ML IJ SOLN
INTRAMUSCULAR | Status: DC | PRN
  Administered 2012-08-25: 4 mg via INTRAVENOUS

## 2012-08-25 MED ORDER — LIDOCAINE HCL (PF) 1 % IJ SOLN
INTRAMUSCULAR | Status: AC
  Filled 2012-08-25: qty 30

## 2012-08-25 MED ORDER — LIDOCAINE HCL (CARDIAC) 20 MG/ML IV SOLN
INTRAVENOUS | Status: DC | PRN
  Administered 2012-08-25: 70 mg via INTRAVENOUS

## 2012-08-25 MED ORDER — PROPOFOL 10 MG/ML IV BOLUS
INTRAVENOUS | Status: DC | PRN
  Administered 2012-08-25: 160 mg via INTRAVENOUS

## 2012-08-25 MED ORDER — OXYCODONE HCL 5 MG PO TABS: 5.0000 mg | ORAL_TABLET | Freq: Once | ORAL | Status: DC | PRN

## 2012-08-25 MED ORDER — ENOXAPARIN SODIUM 40 MG/0.4ML ~~LOC~~ SOLN
40.0000 mg | SUBCUTANEOUS | Status: DC
  Administered 2012-08-26 – 2012-08-27 (×2): 40 mg via SUBCUTANEOUS
  Filled 2012-08-25 (×2): qty 0.4

## 2012-08-25 MED ORDER — SODIUM CHLORIDE 0.9 % IV SOLN: INTRAVENOUS | Status: DC

## 2012-08-25 MED ORDER — HEPARIN SODIUM (PORCINE) 1000 UNIT/ML IJ SOLN
INTRAMUSCULAR | Status: DC | PRN
  Administered 2012-08-25: 8000 [IU] via INTRAVENOUS

## 2012-08-25 MED ORDER — SODIUM CHLORIDE 0.9 % IV SOLN
INTRAVENOUS | Status: DC
  Administered 2012-08-25: 75 mL via INTRAVENOUS
  Administered 2012-08-26: 75 mL/h via INTRAVENOUS

## 2012-08-25 MED ORDER — ROCURONIUM BROMIDE 100 MG/10ML IV SOLN
INTRAVENOUS | Status: DC | PRN
  Administered 2012-08-25: 50 mg via INTRAVENOUS

## 2012-08-25 MED ORDER — LIDOCAINE HCL 4 % MT SOLN
OROMUCOSAL | Status: DC | PRN
  Administered 2012-08-25: 4 mL via TOPICAL

## 2012-08-25 MED ORDER — POTASSIUM CHLORIDE CRYS ER 20 MEQ PO TBCR: 20.0000 meq | EXTENDED_RELEASE_TABLET | Freq: Once | ORAL | Status: AC | PRN

## 2012-08-25 MED ORDER — TRAMADOL HCL 50 MG PO TABS
50.0000 mg | ORAL_TABLET | Freq: Four times a day (QID) | ORAL | Status: DC | PRN
  Administered 2012-08-26 (×2): 50 mg via ORAL
  Filled 2012-08-25 (×2): qty 1

## 2012-08-25 MED ORDER — SODIUM CHLORIDE 0.9 % IV SOLN
10.0000 mg | INTRAVENOUS | Status: DC | PRN
  Administered 2012-08-25: 10 ug/min via INTRAVENOUS

## 2012-08-25 MED ORDER — CITALOPRAM HYDROBROMIDE 20 MG PO TABS
20.0000 mg | ORAL_TABLET | Freq: Every day | ORAL | Status: DC
Start: 2012-08-25 — End: 2012-08-27
  Administered 2012-08-25 – 2012-08-27 (×3): 20 mg via ORAL
  Filled 2012-08-25 (×3): qty 1

## 2012-08-25 MED ORDER — OXYCODONE HCL 5 MG/5ML PO SOLN: 5.0000 mg | Freq: Once | ORAL | Status: DC | PRN

## 2012-08-25 MED ORDER — THROMBIN 20000 UNITS EX SOLR
CUTANEOUS | Status: AC
  Filled 2012-08-25: qty 20000

## 2012-08-25 MED ORDER — ASPIRIN EC 81 MG PO TBEC
81.0000 mg | DELAYED_RELEASE_TABLET | Freq: Every day | ORAL | Status: DC
  Administered 2012-08-25 – 2012-08-27 (×3): 81 mg via ORAL
  Filled 2012-08-25 (×3): qty 1

## 2012-08-25 MED ORDER — MORPHINE SULFATE 2 MG/ML IJ SOLN
2.0000 mg | INTRAMUSCULAR | Status: DC | PRN
  Administered 2012-08-27: 1 mg via INTRAVENOUS
  Filled 2012-08-25: qty 1

## 2012-08-25 MED ORDER — LABETALOL HCL 5 MG/ML IV SOLN: 10.0000 mg | INTRAVENOUS | Status: DC | PRN

## 2012-08-25 MED ORDER — ATORVASTATIN CALCIUM 10 MG PO TABS
10.0000 mg | ORAL_TABLET | Freq: Every day | ORAL | Status: DC
  Administered 2012-08-25: 10 mg via ORAL
  Filled 2012-08-25 (×3): qty 1

## 2012-08-25 MED ORDER — ONDANSETRON HCL 4 MG/2ML IJ SOLN
INTRAMUSCULAR | Status: AC
  Filled 2012-08-25: qty 2

## 2012-08-25 MED ORDER — LOSARTAN POTASSIUM 50 MG PO TABS: 100.0000 mg | ORAL_TABLET | Freq: Every day | ORAL | Status: DC

## 2012-08-25 MED ORDER — DOCUSATE SODIUM 100 MG PO CAPS
100.0000 mg | ORAL_CAPSULE | Freq: Every day | ORAL | Status: DC
  Administered 2012-08-26 – 2012-08-27 (×2): 100 mg via ORAL
  Filled 2012-08-25 (×2): qty 1

## 2012-08-25 MED ORDER — 0.9 % SODIUM CHLORIDE (POUR BTL) OPTIME
TOPICAL | Status: DC | PRN
  Administered 2012-08-25: 2000 mL

## 2012-08-25 MED ORDER — DOPAMINE-DEXTROSE 3.2-5 MG/ML-% IV SOLN
INTRAVENOUS | Status: AC
  Filled 2012-08-25: qty 250

## 2012-08-25 MED ORDER — FENTANYL CITRATE 0.05 MG/ML IJ SOLN
INTRAMUSCULAR | Status: DC | PRN
  Administered 2012-08-25: 50 ug via INTRAVENOUS
  Administered 2012-08-25: 100 ug via INTRAVENOUS

## 2012-08-25 MED ORDER — PANTOPRAZOLE SODIUM 40 MG PO TBEC
40.0000 mg | DELAYED_RELEASE_TABLET | Freq: Every day | ORAL | Status: DC
  Administered 2012-08-26 – 2012-08-27 (×2): 40 mg via ORAL
  Filled 2012-08-25 (×2): qty 1

## 2012-08-25 MED ORDER — ONDANSETRON HCL 4 MG/2ML IJ SOLN
4.0000 mg | Freq: Four times a day (QID) | INTRAMUSCULAR | Status: DC | PRN
  Administered 2012-08-25 – 2012-08-27 (×5): 4 mg via INTRAVENOUS
  Filled 2012-08-25 (×4): qty 2

## 2012-08-25 MED ORDER — METOPROLOL TARTRATE 25 MG PO TABS
25.0000 mg | ORAL_TABLET | Freq: Two times a day (BID) | ORAL | Status: DC
  Filled 2012-08-25 (×5): qty 1

## 2012-08-25 MED ORDER — ACETAMINOPHEN 325 MG RE SUPP
325.0000 mg | RECTAL | Status: DC | PRN
Start: 2012-08-25 — End: 2012-08-27
  Filled 2012-08-25: qty 2

## 2012-08-25 MED ORDER — METOPROLOL TARTRATE 1 MG/ML IV SOLN: 2.0000 mg | INTRAVENOUS | Status: DC | PRN

## 2012-08-25 MED ORDER — DOPAMINE-DEXTROSE 3.2-5 MG/ML-% IV SOLN: 3.0000 ug/kg/min | INTRAVENOUS | Status: DC

## 2012-08-25 MED ORDER — ALBUTEROL SULFATE HFA 108 (90 BASE) MCG/ACT IN AERS
2.0000 | INHALATION_SPRAY | Freq: Four times a day (QID) | RESPIRATORY_TRACT | Status: DC | PRN
  Filled 2012-08-25: qty 6.7

## 2012-08-25 MED ORDER — ALUM & MAG HYDROXIDE-SIMETH 200-200-20 MG/5ML PO SUSP: 15.0000 mL | ORAL | Status: DC | PRN

## 2012-08-25 MED ORDER — LOSARTAN POTASSIUM 50 MG PO TABS
100.0000 mg | ORAL_TABLET | Freq: Every day | ORAL | Status: DC
  Filled 2012-08-25 (×3): qty 2

## 2012-08-25 MED ORDER — OXYCODONE HCL 5 MG PO TABS
ORAL_TABLET | ORAL | Status: AC
  Administered 2012-08-25: 5 mg
  Filled 2012-08-25: qty 1

## 2012-08-25 MED ORDER — ASPIRIN 81 MG PO TABS: 81.0000 mg | ORAL_TABLET | Freq: Every day | ORAL | Status: DC

## 2012-08-25 MED ORDER — PHENOL 1.4 % MT LIQD: 1.0000 | OROMUCOSAL | Status: DC | PRN

## 2012-08-25 MED ORDER — DOPAMINE-DEXTROSE 3.2-5 MG/ML-% IV SOLN
3.0000 ug/kg/min | INTRAVENOUS | Status: DC
  Administered 2012-08-25: 5 ug/kg/min via INTRAVENOUS

## 2012-08-25 MED ORDER — ADULT MULTIVITAMIN W/MINERALS CH
1.0000 | ORAL_TABLET | Freq: Every day | ORAL | Status: DC
  Administered 2012-08-26 – 2012-08-27 (×2): 1 via ORAL
  Filled 2012-08-25 (×3): qty 1

## 2012-08-25 MED ORDER — SODIUM CHLORIDE 0.9 % IR SOLN
Status: DC | PRN
  Administered 2012-08-25: 08:00:00

## 2012-08-25 MED ORDER — DEXTROSE 5 % IV SOLN
1.5000 g | Freq: Two times a day (BID) | INTRAVENOUS | Status: AC
  Administered 2012-08-25 – 2012-08-26 (×2): 1.5 g via INTRAVENOUS
  Filled 2012-08-25 (×2): qty 1.5

## 2012-08-25 MED ORDER — HYDROMORPHONE HCL PF 1 MG/ML IJ SOLN: 0.2500 mg | INTRAMUSCULAR | Status: DC | PRN

## 2012-08-25 MED ORDER — ONDANSETRON HCL 4 MG/2ML IJ SOLN: 4.0000 mg | Freq: Four times a day (QID) | INTRAMUSCULAR | Status: DC | PRN

## 2012-08-25 MED ORDER — ACETAMINOPHEN 325 MG PO TABS
325.0000 mg | ORAL_TABLET | ORAL | Status: DC | PRN
  Administered 2012-08-26 (×2): 325 mg via ORAL
  Filled 2012-08-25 (×2): qty 1

## 2012-08-25 MED ORDER — PROTAMINE SULFATE 10 MG/ML IV SOLN
INTRAVENOUS | Status: DC | PRN
  Administered 2012-08-25 (×4): 10 mg via INTRAVENOUS
  Administered 2012-08-25: 20 mg via INTRAVENOUS
  Administered 2012-08-25 (×2): 10 mg via INTRAVENOUS

## 2012-08-25 MED ORDER — HYDROCHLOROTHIAZIDE 25 MG PO TABS
25.0000 mg | ORAL_TABLET | Freq: Every day | ORAL | Status: DC
  Filled 2012-08-25 (×3): qty 1

## 2012-08-25 SURGICAL SUPPLY — 50 items
ADH SKN CLS APL DERMABOND .7 (GAUZE/BANDAGES/DRESSINGS) ×1
CANISTER SUCTION 2500CC (MISCELLANEOUS) ×2 IMPLANT
CANNULA VESSEL W/WING WO/VALVE (CANNULA) ×2 IMPLANT
CATH ROBINSON RED A/P 18FR (CATHETERS) ×1 IMPLANT
CLIP TI MEDIUM 24 (CLIP) ×2 IMPLANT
CLIP TI WIDE RED SMALL 24 (CLIP) ×2 IMPLANT
CLOTH BEACON ORANGE TIMEOUT ST (SAFETY) ×2 IMPLANT
COVER SURGICAL LIGHT HANDLE (MISCELLANEOUS) ×2 IMPLANT
CRADLE DONUT ADULT HEAD (MISCELLANEOUS) ×2 IMPLANT
DECANTER SPIKE VIAL GLASS SM (MISCELLANEOUS) IMPLANT
DERMABOND ADVANCED (GAUZE/BANDAGES/DRESSINGS) ×1
DERMABOND ADVANCED .7 DNX12 (GAUZE/BANDAGES/DRESSINGS) ×1 IMPLANT
DRAIN HEMOVAC 1/8 X 5 (WOUND CARE) IMPLANT
DRAPE WARM FLUID 44X44 (DRAPE) ×2 IMPLANT
ELECT REM PT RETURN 9FT ADLT (ELECTROSURGICAL) ×2
ELECTRODE REM PT RTRN 9FT ADLT (ELECTROSURGICAL) ×1 IMPLANT
EVACUATOR SILICONE 100CC (DRAIN) IMPLANT
GEL ULTRASOUND 20GR AQUASONIC (MISCELLANEOUS) IMPLANT
GLOVE BIO SURGEON STRL SZ7.5 (GLOVE) ×1 IMPLANT
GLOVE BIOGEL PI IND STRL 6.5 (GLOVE) IMPLANT
GLOVE BIOGEL PI INDICATOR 6.5 (GLOVE) ×2
GLOVE SURG SS PI 6.5 STRL IVOR (GLOVE) ×1 IMPLANT
GLOVE SURG SS PI 7.5 STRL IVOR (GLOVE) ×2 IMPLANT
GOWN PREVENTION PLUS XLARGE (GOWN DISPOSABLE) ×2 IMPLANT
GOWN STRL NON-REIN LRG LVL3 (GOWN DISPOSABLE) ×4 IMPLANT
KIT BASIN OR (CUSTOM PROCEDURE TRAY) ×2 IMPLANT
KIT ROOM TURNOVER OR (KITS) ×2 IMPLANT
KIT SUCTION CATH 14FR (SUCTIONS) ×1 IMPLANT
LOOP VESSEL MINI RED (MISCELLANEOUS) IMPLANT
NDL HYPO 25GX1X1/2 BEV (NEEDLE) IMPLANT
NEEDLE HYPO 25GX1X1/2 BEV (NEEDLE) IMPLANT
NS IRRIG 1000ML POUR BTL (IV SOLUTION) ×4 IMPLANT
PACK CAROTID (CUSTOM PROCEDURE TRAY) ×2 IMPLANT
PAD ARMBOARD 7.5X6 YLW CONV (MISCELLANEOUS) ×4 IMPLANT
PATCH HEMASHIELD 8X75 (Vascular Products) ×1 IMPLANT
SHUNT CAROTID BYPASS 10 (VASCULAR PRODUCTS) ×1 IMPLANT
SHUNT CAROTID BYPASS 12FRX15.5 (VASCULAR PRODUCTS) IMPLANT
SPONGE SURGIFOAM ABS GEL 100 (HEMOSTASIS) IMPLANT
SUT ETHILON 3 0 PS 1 (SUTURE) IMPLANT
SUT PROLENE 6 0 CC (SUTURE) ×2 IMPLANT
SUT PROLENE 7 0 BV 1 (SUTURE) IMPLANT
SUT SILK 3 0 TIES 17X18 (SUTURE)
SUT SILK 3-0 18XBRD TIE BLK (SUTURE) IMPLANT
SUT VIC AB 3-0 SH 27 (SUTURE) ×2
SUT VIC AB 3-0 SH 27X BRD (SUTURE) ×1 IMPLANT
SUT VICRYL 4-0 PS2 18IN ABS (SUTURE) ×2 IMPLANT
SYR CONTROL 10ML LL (SYRINGE) IMPLANT
TOWEL OR 17X24 6PK STRL BLUE (TOWEL DISPOSABLE) ×3 IMPLANT
TOWEL OR 17X26 10 PK STRL BLUE (TOWEL DISPOSABLE) ×2 IMPLANT
WATER STERILE IRR 1000ML POUR (IV SOLUTION) ×2 IMPLANT

## 2012-08-25 NOTE — Anesthesia Preprocedure Evaluation (Addendum)
Anesthesia Evaluation  Patient identified by MRN, date of birth, ID band Patient awake    Reviewed: Allergy & Precautions, H&P , NPO status , Patient's Chart, lab work & pertinent test results, reviewed documented beta blocker date and time   Airway Mallampati: II  Neck ROM: full    Dental  (+) Dental Advisory Given   Pulmonary          Cardiovascular + CAD, + CABG and + Peripheral Vascular Disease     Neuro/Psych    GI/Hepatic   Endo/Other    Renal/GU      Musculoskeletal  (+) Arthritis -,   Abdominal   Peds  Hematology   Anesthesia Other Findings   Reproductive/Obstetrics                          Anesthesia Physical Anesthesia Plan  ASA: III  Anesthesia Plan: General   Post-op Pain Management:    Induction: Intravenous  Airway Management Planned: Oral ETT  Additional Equipment: Arterial line  Intra-op Plan:   Post-operative Plan: Extubation in OR  Informed Consent: I have reviewed the patients History and Physical, chart, labs and discussed the procedure including the risks, benefits and alternatives for the proposed anesthesia with the patient or authorized representative who has indicated his/her understanding and acceptance.   Dental advisory given  Plan Discussed with: CRNA, Anesthesiologist and Surgeon  Anesthesia Plan Comments:        Anesthesia Quick Evaluation

## 2012-08-25 NOTE — Progress Notes (Signed)
Dr.Fields at bedside, updated on pt status, BP 86/46 and currently rec'ing 1L bolus, new orders rec'd, will cont to assess vitals closely, will administer second liter bolus prn

## 2012-08-25 NOTE — Anesthesia Procedure Notes (Signed)
Procedure Name: Intubation Date/Time: 08/25/2012 7:38 AM Performed by: Jerilee Hoh Pre-anesthesia Checklist: Patient identified, Emergency Drugs available, Suction available and Patient being monitored Patient Re-evaluated:Patient Re-evaluated prior to inductionOxygen Delivery Method: Circle system utilized Preoxygenation: Pre-oxygenation with 100% oxygen Intubation Type: IV induction Ventilation: Mask ventilation without difficulty and Oral airway inserted - appropriate to patient size Laryngoscope Size: Mac and 4 Grade View: Grade I Tube type: Oral Tube size: 7.5 mm Number of attempts: 1 Airway Equipment and Method: LTA kit utilized and Stylet Placement Confirmation: ETT inserted through vocal cords under direct vision,  positive ETCO2 and breath sounds checked- equal and bilateral Secured at: 22 cm Tube secured with: Tape Dental Injury: Teeth and Oropharynx as per pre-operative assessment

## 2012-08-25 NOTE — Progress Notes (Signed)
Order also obtained to treat bp by a-line not cuff

## 2012-08-25 NOTE — Progress Notes (Signed)
Consulted with samantha pa regarding pts bp sys in 80,s orders obtained and carried out will adminster 1 liter lr fluids

## 2012-08-25 NOTE — Transfer of Care (Signed)
Immediate Anesthesia Transfer of Care Note  Patient: [name]  Procedure(s) Performed: Procedure(s): ENDARTERECTOMY CAROTID (Right)  Patient Location: PACU  Anesthesia Type:General  Level of Consciousness: sedated, patient cooperative and responds to stimulation  Airway & Oxygen Therapy: Patient Spontanous Breathing and Patient connected to face mask oxygen  Post-op Assessment: Report given to PACU RN, Post -op Vital signs reviewed and stable and Patient moving all extremities  Post vital signs: Reviewed and stable  Complications: No apparent anesthesia complications

## 2012-08-25 NOTE — H&P (View-Only) (Signed)
History of Present Illness:  Patient is a 79 y.o. year old female who presents for evaluation of carotid stenosis.  This has been followed with serial duplex since 2007.  The patient denies symptoms of TIA, amaurosis, or stroke.  The patient is currently on aspirin antiplatelet therapy.  The carotid stenosis was found on pre CABG workup.   It has now progressed to high grade.    Other medical problems include coronary artery disease, hypercholesterolemia.  These are currently stable and followed by Dr Gangi.  She has a stress test scheduled next week.  Past Medical History  Diagnosis Date  . Coronary artery disease   . Hypercholesteremia   . Chronic cough   . Arthritis     Past Surgical History  Procedure Laterality Date  . Abdominal hysterectomy    . Appendectomy    . Eye surgery      both cataracts  . Orif ankle fracture  06/14/2011    Procedure: OPEN REDUCTION INTERNAL FIXATION (ORIF) ANKLE FRACTURE;  Surgeon: Daniel F Murphy, MD;  Location: [facility];  Service: Orthopedics;  Laterality: Left;  left ankle fracture open treatment trimalleolar ankle includes internal fixation WITHOUT fixation of posterior lip  . Coronary artery bypass graft    . Shoulder arthroscopy with subacromial decompression, rotator cuff repair and bicep tendon repair Left 06/05/2012    Procedure: LEFT SHOULDER ARTHROSCOPY WITH SUBACROMIAL DECOMPRESSION, PARTIAL ACROMIOPLASTY WITH CORACOACROMIAL RELEASE, DISTAL CLAVICULECTOMY, WITH ROTATOR CUFF REPAIR AND BICEP TENODESIS;  Surgeon: Daniel F Murphy, MD;  Location: [facility];  Service: Orthopedics;  Laterality: Left;     Social History History  Substance Use Topics  . Smoking status: Never Smoker   . Smokeless tobacco: Not on file  . Alcohol Use: No    Family History History reviewed. No pertinent family history.  Allergies  Allergies  Allergen Reactions  . Hydrocodone Shortness Of Breath    Rash-itching-tongue swelled  .  Oxycodone Swelling    Rash-itching  . Latex Rash     Current Outpatient Prescriptions  Medication Sig Dispense Refill  . aspirin 81 MG tablet Take 81 mg by mouth daily.      . Cholecalciferol (VITAMIN D PO) Take 1 capsule by mouth daily.      . citalopram (CELEXA) 20 MG tablet Take 1 tablet by mouth daily.      . fenofibrate micronized (ANTARA) 43 MG capsule Take 1 capsule by mouth daily.      . hydrochlorothiazide (HYDRODIURIL) 25 MG tablet Take 25 mg by mouth daily.      . losartan (COZAAR) 100 MG tablet Take 100 mg by mouth daily.      . metoprolol tartrate (LOPRESSOR) 25 MG tablet Take 25 mg by mouth 2 (two) times daily.      . Multiple Vitamin (MULITIVITAMIN WITH MINERALS) TABS Take 1 tablet by mouth daily.      . rosuvastatin (CRESTOR) 10 MG tablet Take 10 mg by mouth every evening.      . albuterol (PROVENTIL HFA;VENTOLIN HFA) 108 (90 BASE) MCG/ACT inhaler Inhale 2 puffs into the lungs every 6 (six) hours as needed. For wheezing/cough      . tapentadol (NUCYNTA) 50 MG TABS Take 1 tablet (50 mg total) by mouth every 6 (six) hours as needed.    0   No current facility-administered medications for this visit.    ROS:   General:  No weight loss, Fever, chills  HEENT: No recent headaches, no nasal bleeding,   no visual changes, no sore throat  Neurologic: No dizziness, blackouts, seizures. No recent symptoms of stroke or mini- stroke. No recent episodes of slurred speech, or temporary blindness.  Cardiac: No recent episodes of chest pain/pressure, no shortness of breath at rest.  No shortness of breath with exertion.  Denies history of atrial fibrillation or irregular heartbeat  Vascular: No history of rest pain in feet.  No history of claudication.  No history of non-healing ulcer, No history of DVT   Pulmonary: No home oxygen, no productive cough, no hemoptysis,  No asthma or wheezing  Musculoskeletal:  [ ] Arthritis, [ ] Low back pain,  [ ] Joint pain  Hematologic:No history  of hypercoagulable state.  No history of easy bleeding.  No history of anemia  Gastrointestinal: No hematochezia or melena,  No gastroesophageal reflux, no trouble swallowing  Urinary: [ ] chronic Kidney disease, [ ] on HD - [ ] MWF or [ ] TTHS, [ ] Burning with urination, [ ] Frequent urination, [ ] Difficulty urinating;   Skin: No rashes  Psychological: No history of anxiety,  No history of depression   Physical Examination  Filed Vitals:   08/14/12 1448 08/14/12 1453  BP: 120/60 137/59  Pulse: 64   Height: 5' 2" (1.575 m)   Weight: 162 lb 12.8 oz (73.846 kg)   SpO2: 93%     Body mass index is 29.77 kg/(m^2).  General:  Alert and oriented, no acute distress HEENT: Normal Neck: No bruit or JVD Pulmonary: Clear to auscultation bilaterally Cardiac: Regular Rate and Rhythm without murmur Gastrointestinal: Soft, non-tender, non-distended, no mass Skin: No rash Extremity Pulses:  2+ radial, brachial, femoral, absent dorsalis pedis, 2+ posterior tibial pulses bilaterally Musculoskeletal: No deformity or edema  Neurologic: Upper and lower extremity motor 5/5 and symmetric  DATA: Carotid duplex today.  I reviewed and interpreted the study.  PSV 305 EDV 63 high bifurcation, prior study 50-70% left, 338/98 right   ASSESSMENT: Asymptomatic Right ICA stenosis high grade with moderate left.  High bifurcation.   PLAN:  Right CEA 08/25/12.  Stress test next week. Continue aspirin.   I discussed with the patient the risks of carotid endarterectomy including but not limited to myocardial events 5%, cranial nerve injury 10-15%, stroke 1-2%, bleeding or infection 1%  I also discussed the benefits of long term stroke prevention and the advantage compared to medical therapy  The patient is aware of the risks and agrees to proceed forward with the procedure.   Doy Taaffe, MD [facility] [place] Office: [phone] Pager: [phone]  

## 2012-08-25 NOTE — Anesthesia Postprocedure Evaluation (Signed)
Anesthesia Post Note  Patient: [name]  Procedure(s) Performed: Procedure(s) (LRB): ENDARTERECTOMY CAROTID (Right)  Anesthesia type: General  Patient location: PACU  Post pain: Pain level controlled and Adequate analgesia  Post assessment: Post-op Vital signs reviewed, Patient's Cardiovascular Status Stable, Respiratory Function Stable, Patent Airway and Pain level controlled  Last Vitals:  Filed Vitals:   08/25/12 1015  BP: 114/44  Pulse: 49  Temp:   Resp: 23    Post vital signs: Reviewed and stable  Level of consciousness: awake, alert  and oriented  Complications: No apparent anesthesia complications

## 2012-08-25 NOTE — Progress Notes (Signed)
Patient received to room 3315 from PACU.Miminal assist to our bed. Pt. Is alert and oriented, Left grip is slightly weaker than right,this is normal per patient. When patient smiles she deviates alittle to left. Family at bedside, pt. Instructed to call for assisstance.

## 2012-08-25 NOTE — Op Note (Signed)
Procedure Right carotid endarterectomy  Preoperative diagnosis: High-grade asymptomatic right internal carotid artery stenosis  Postoperative diagnosis: Same  Anesthesia General  Asst.: Doreatha Massed, PAC  Operative findings: #1 greater than 70% right internal carotid stenosis, small internal                                                              #2 Dacron patch           #3 10 Fr pediatric feeding tube shunt  Operative details: After obtaining informed consent, the patient was taken to the operating room. The patient was placed in a supine position on the operating room table. After induction of general anesthesia and endotracheal intubation a Foley catheter was placed. Next the patient's entire neck and chest was prepped and draped in the usual sterile fashion. An oblique incision was made on the right aspect of the patient's neck anterior to the border the right sternocleidomastoid muscle. The incision was carried into the subcutaneous tissues and through the platysma. The sternocleidomastoid muscle was identified and reflected laterally. The common carotid artery was then found at the base of the incision this was dissected free circumferentially. It was fairly soft on palpation.  The vagus nerve was identified and protected. Dissection was then carried up to the level carotid bifurcation.   The hyperglossal nerve was well above the primary area of dissection. The internal carotid artery was dissected free circumferentially just below the level of the hypoglossal nerve and it was soft in character at this location and above any palpable disease. A vessel loop was placed around this.Next the external carotid and superior thyroid arteries were dissected free circumferentially and vessel loops were placed around these. The patient was given 8000 units of intravenous heparin.  After 2 minutes of circulation time and raising the mean arterial pressure to 90 mm mercury, the distal internal carotid  artery was controlled with small bulldog clamp. The external carotid and superior thyroid arteries were controlled with vessel loops. The common carotid artery was controlled with a peripheral DeBakey clamp. A longitudinal opening was made in the common carotid artery just below the bifurcation. The arteriotomy was extended distally up into the internal carotid with Potts scissors. There was a calcified plaque with greater than 70% stenosis in the internal carotid.   A 10 Fr shunt was brought onto the field.  This was threaded into the distal internal carotid artery and allowed to backbleed thoroughly.  There was pulsatile backbleeding.  This was then threaded into the common carotid and secured with a Rummel tourniquet.   There was no air at this point and flow was restored to the brain.  Attention was then turned to the common carotid artery once again. A suitable endarterectomy plane was obtained and endarterectomy was begun in the common carotid artery and a good proximal endpoint was obtained. An eversion endarterectomy was performed on the external carotid artery and a good endpoint was obtained. The plaque was then elevated in the internal carotid artery and a nice feathered distal endpoint was also obtained.  The plaque was passed off the table. All loose debris was then removed from the carotid bed and everything was thoroughly irrigated with heparinized saline. A Dacron patch was then brought on to the operative field and this  was sewn on as a patch angioplasty using a running 6-0 Prolene suture. Prior to completion of the anastomosis the internal carotid artery was thoroughly backbled. This was then controlled again with a fine bulldog clamp.  The common carotid was thoroughly flushed forward. The external carotid was also thoroughly backbled.  The remainder of the patch was completed and the anastomosis was secured. Flow was then restored first retrograde from the external carotid into the carotid bed  then antegrade from the common carotid to the external carotid artery and after approximately 5 cardiac cycles to the internal carotid artery. Doppler was used to evaluate the external/internal and common carotid arteries and these all had good Doppler flow. The patient was also given 80 mg of Protamine.     The platysma muscle was reapproximated using a running 3-0 Vicryl suture. The skin was closed with 4 0 Vicryl subcuticular stitch.  The patient was awakened in the operating room and was moving upper and lower extremities symmetrically and following commands.  The patient was stable on arrival to the PACU.  Fabienne Bruns, MD [facility] [place] Office: [phone] Pager: [phone]

## 2012-08-25 NOTE — Progress Notes (Signed)
ANTIBIOTIC CONSULT NOTE - INITIAL  Pharmacy Consult for antibiotic (adjust for renal function)  Cefuroxime IV Indication:  Prophylaxis post op   Allergies  Allergen Reactions  . Hydrocodone Shortness Of Breath    Rash-itching-tongue swelled  . Oxycodone Swelling    Rash-itching  . Latex Rash and Other (See Comments)    "Blisters in mouth"    Patient Measurements: Height: 5' 1.81" (157 cm) (from 08/22/12) Weight: 163 lb 2.3 oz (74 kg) (from 08/22/12 data) IBW/kg (Calculated) : 49.67   Vital Signs: Temp: 98.4 F (36.9 C) (07/21 0953) Temp src: Oral (07/21 0553) BP: 89/41 mmHg (07/21 1300) Pulse Rate: 60 (07/21 1345) Intake/Output from previous day:   Intake/Output from this shift: Total I/O In: 1000 [I.V.:1000] Out: 100 [Blood:100]  Labs:  Recent Labs  08/25/12 1028  WBC 6.4  HGB 12.6  PLT 184  CREATININE 0.73   Estimated Creatinine Clearance: 56.1 ml/min (by C-G formula based on Cr of 0.73). No results found for this basename: VANCOTROUGH, Leodis Binet, VANCORANDOM, GENTTROUGH, GENTPEAK, GENTRANDOM, TOBRATROUGH, TOBRAPEAK, TOBRARND, AMIKACINPEAK, AMIKACINTROU, AMIKACIN,  in the last 72 hours   Microbiology: Recent Results (from the past 720 hour(s))  SURGICAL PCR SCREEN     Status: None   Collection Time    08/22/12 12:45 PM      Result Value Range Status   MRSA, PCR NEGATIVE  NEGATIVE Final   Staphylococcus aureus NEGATIVE  NEGATIVE Final   Comment:            The Xpert SA Assay (FDA     approved for NASAL specimens     in patients over 52 years of age),     is one component of     a comprehensive surveillance     program.  Test performance has     been validated by [employer] for patients greater     than or equal to 47 year old.     It is not intended     to diagnose infection nor to     guide or monitor treatment.    Medical History: Past Medical History  Diagnosis Date  . Coronary artery disease   . Hypercholesteremia   . Chronic cough    . Arthritis   . Peripheral vascular disease   . Internal carotid artery stenosis 08/2012  . Normal cardiac stress test 08/2012    @ Dr Verl Dicker office    Medications:  Prescriptions prior to admission  Medication Sig Dispense Refill  . albuterol (PROVENTIL HFA;VENTOLIN HFA) 108 (90 BASE) MCG/ACT inhaler Inhale 2 puffs into the lungs every 6 (six) hours as needed. For wheezing/cough      . aspirin 81 MG tablet Take 81 mg by mouth daily.      . Cholecalciferol (VITAMIN D PO) Take 1 capsule by mouth daily.      . citalopram (CELEXA) 20 MG tablet Take 1 tablet by mouth daily.      . fenofibrate micronized (ANTARA) 43 MG capsule Take 1 capsule by mouth daily.      . hydrochlorothiazide (HYDRODIURIL) 25 MG tablet Take 25 mg by mouth daily.      Boris Lown Oil 300 MG CAPS Take 1 capsule by mouth daily.      Marland Kitchen losartan (COZAAR) 100 MG tablet Take 100 mg by mouth daily.      . metoprolol tartrate (LOPRESSOR) 25 MG tablet Take 25 mg by mouth 2 (two) times daily.      Marland Kitchen  Multiple Vitamin (MULITIVITAMIN WITH MINERALS) TABS Take 1 tablet by mouth daily.      . rosuvastatin (CRESTOR) 20 MG tablet Take 20 mg by mouth daily.       Scheduled:  . cefUROXime (ZINACEF)  IV  1.5 g Intravenous Q12H  . [START ON 08/26/2012] docusate sodium  100 mg Oral Daily  . DOPamine      . [START ON 08/26/2012] enoxaparin (LOVENOX) injection  40 mg Subcutaneous Q24H  . ondansetron      . pantoprazole  40 mg Oral Daily   Assessment: 79 y.o female s/p right carotid endarterectomy.  PMH as noted above.  SCr 0.73, CrCl ~ 56 ml/min.  Received cefuroxime 1.5 g @ 07:48 preop today. Current post op dose of cefuroxime 1.5 g IV q12h x 2 doses is appropriate for patient's renal function.   Goal of Therapy:  Prevent post op infection  Plan:  No change in cefuroxime dose necessary. Continue cefuroxime 1.5 g IV q12h x 2 doses post op.   Noah Delaine, RPh Clinical Pharmacist Pager: [phone]  08/25/2012,3:30 PM

## 2012-08-25 NOTE — Progress Notes (Signed)
Consulted with dr fields regarding pts sys bp remaining in 90,s-low 100 after 2 ns bolus orders obtained and carried out..will start dopamine gtt as ordered

## 2012-08-25 NOTE — Interval H&P Note (Signed)
History and Physical Interval Note:  08/25/2012 7:24 AM  [name]  has presented today for surgery, with the diagnosis of Right Internal Carotid Artery Stenosis  The various methods of treatment have been discussed with the patient and family. After consideration of risks, benefits and other options for treatment, the patient has consented to  Procedure(s): ENDARTERECTOMY CAROTID (Right) as a surgical intervention .  The patient's history has been reviewed, patient examined, no change in status, stable for surgery.  I have reviewed the patient's chart and labs.  Questions were answered to the patient's satisfaction.     Maurie Musco E

## 2012-08-25 NOTE — OR Nursing (Signed)
Late entry for documentation due to computer system downtime.

## 2012-08-25 NOTE — Preoperative (Signed)
Beta Blockers   Reason not to administer Beta Blockers:Not Applicable 

## 2012-08-26 ENCOUNTER — Telehealth: Payer: Self-pay | Admitting: Vascular Surgery

## 2012-08-26 ENCOUNTER — Encounter: Payer: Self-pay | Admitting: Internal Medicine

## 2012-08-26 ENCOUNTER — Encounter (HOSPITAL_COMMUNITY): Payer: Self-pay | Admitting: Vascular Surgery

## 2012-08-26 LAB — BASIC METABOLIC PANEL
BUN: 10 mg/dL (ref 6–23)
CO2: 30 mEq/L (ref 19–32)
Calcium: 8.1 mg/dL — ABNORMAL LOW (ref 8.4–10.5)
Chloride: 104 mEq/L (ref 96–112)
Creatinine, Ser: 0.75 mg/dL (ref 0.50–1.10)
GFR calc Af Amer: >90 mL/min (ref >90)
GFR calc non Af Amer: 80 mL/min — ABNORMAL LOW (ref >90)
Glucose, Bld: 111 mg/dL — ABNORMAL HIGH (ref 70–99)
Potassium: 3.4 mEq/L — ABNORMAL LOW (ref 3.5–5.1)
Sodium: 140 mEq/L (ref 135–145)

## 2012-08-26 LAB — CBC
HCT: 33.6 % — ABNORMAL LOW (ref 36.0–46.0)
Hemoglobin: 11.7 g/dL — ABNORMAL LOW (ref 12.0–15.0)
MCH: 33.6 pg (ref 26.0–34.0)
MCHC: 34.8 g/dL (ref 30.0–36.0)
MCV: 96.6 fL (ref 78.0–100.0)
Platelets: 191 10*3/uL (ref 150–400)
RBC: 3.48 MIL/uL — ABNORMAL LOW (ref 3.87–5.11)
RDW: 12.1 % (ref 11.5–15.5)
WBC: 6.5 10*3/uL (ref 4.0–10.5)

## 2012-08-26 MED ORDER — TRAMADOL HCL 50 MG PO TABS: 50.0000 mg | ORAL_TABLET | Freq: Four times a day (QID) | ORAL | Status: DC | PRN

## 2012-08-26 MED ORDER — HYDROCHLOROTHIAZIDE 25 MG PO TABS: 25.0000 mg | ORAL_TABLET | Freq: Every day | ORAL | Status: DC

## 2012-08-26 MED ORDER — SODIUM CHLORIDE 0.9 % IV SOLN
Freq: Once | INTRAVENOUS | Status: AC
  Administered 2012-08-26: 08:00:00 via INTRAVENOUS

## 2012-08-26 MED ORDER — LOSARTAN POTASSIUM 100 MG PO TABS: 100.0000 mg | ORAL_TABLET | Freq: Every day | ORAL | Status: DC

## 2012-08-26 MED ORDER — WHITE PETROLATUM GEL
Status: AC
  Administered 2012-08-26: 20:00:00
  Filled 2012-08-26: qty 5

## 2012-08-26 NOTE — Plan of Care (Signed)
Problem: Phase I Progression Outcomes Goal: Other Phase I Outcomes/Goals Outcome: Progressing Still progressing pt off of dopamine gtt

## 2012-08-26 NOTE — Discharge Summary (Signed)
[facility] Discharge Summary  [name] [birth year] 79 y.o. female  [id number]  Admission Date: 08/25/2012  Discharge Date: 08/27/12  Physician: Sherren Kerns, MD  Admission Diagnosis: Right Internal Carotid Artery Stenosis   HPI:   This is a 79 y.o. female who presents for evaluation of carotid stenosis. This has been followed with serial duplex since 2007. The patient denies symptoms of TIA, amaurosis, or stroke. The patient is currently on aspirin antiplatelet therapy. The carotid stenosis was found on pre CABG workup. It has now progressed to high grade. Other medical problems include coronary artery disease, hypercholesterolemia. These are currently stable and followed by Dr Nadara Eaton. She has a stress test scheduled next week.  Hospital Course:  The patient was admitted to the hospital and taken to the operating room on 08/25/2012 and underwent right carotid endarterectomy.  The pt tolerated the procedure well and was transported to the PACU in good condition.  Her systolic BP was running in the 80's.  She was given 2 fluid boluses with no improvement.  She was then started on a dopamine gtt.   By POD 1, the pt neuro status was in tact.  She did have acute surgical blood loss anemia.  Her dopamine was weaned, but did have to be restarted as she dropped her systolic pressure back down to the 80's.   Her BP meds were continued to be held.  By POD 2, her dopamine was weaned off successfully.  Her antihypertensives were held.  She is instructed to start these back on Friday if her BP will tolerate.  Discussed with pt about checking her BP.  Her Losartan dose was cut in half for when she does resume this.  This can be added back as her BP tolerates.  The remainder of the hospital course consisted of increasing mobilization and increasing intake of solids without difficulty.    Recent Labs  08/26/12 0340  NA 140  K 3.4*  CL 104  CO2 30  GLUCOSE 111*   BUN 10  CALCIUM 8.1*    Recent Labs  08/25/12 1028 08/26/12 0340  WBC 6.4 6.5  HGB 12.6 11.7*  HCT 35.9* 33.6*  PLT 184 191   No results found for this basename: INR,  in the last 72 hours  Discharge Instructions:   The patient is discharged to home with extensive instructions on wound care and progressive ambulation.  They are instructed not to drive or perform any heavy lifting until returning to see the physician in his office.  Discharge Orders   Future Orders Complete By Expires     CAROTID Sugery: Call MD for difficulty swallowing or speaking; weakness in arms or legs that is a new symtom; severe headache.  If you have increased swelling in the neck and/or  are having difficulty breathing, CALL 911  As directed     Call MD for:  redness, tenderness, or signs of infection (pain, swelling, bleeding, redness, odor or green/yellow discharge around incision site)  As directed     Call MD for:  severe or increased pain, loss or decreased feeling  in affected limb(s)  As directed     Call MD for:  temperature >100.5  As directed     Discharge wound care:  As directed     Comments:      Shower daily with soap and water starting 08/26/12    Driving Restrictions  As directed     Comments:  No driving for 2 weeks    Lifting restrictions  As directed     Comments:      No lifting for 2 weeks    Resume previous diet  As directed        Discharge Diagnosis:  Right Internal Carotid Artery Stenosis  Secondary Diagnosis: Patient Active Problem List   Diagnosis Date Noted  . Occlusion and stenosis of carotid artery without mention of cerebral infarction 08/14/2012   Past Medical History  Diagnosis Date  . Coronary artery disease   . Hypercholesteremia   . Chronic cough   . Arthritis   . Peripheral vascular disease   . Internal carotid artery stenosis 08/2012  . Normal cardiac stress test 08/2012    @ Dr Verl Dicker office     Medication List         albuterol 108 (90  BASE) MCG/ACT inhaler  Commonly known as:  PROVENTIL HFA;VENTOLIN HFA  Inhale 2 puffs into the lungs every 6 (six) hours as needed. For wheezing/cough     aspirin 81 MG tablet  Take 81 mg by mouth daily.     citalopram 20 MG tablet  Commonly known as:  CELEXA  Take 1 tablet by mouth daily.     fenofibrate micronized 43 MG capsule  Commonly known as:  ANTARA  Take 1 capsule by mouth daily.     hydrochlorothiazide 25 MG tablet  Commonly known as:  HYDRODIURIL  Take 1 tablet (25 mg total) by mouth daily.  Start taking on:  08/29/2012     Krill Oil 300 MG Caps  Take 1 capsule by mouth daily.     losartan 100 MG tablet  Commonly known as:  COZAAR  Take 0.5 tablets (50 mg total) by mouth daily.  Start taking on:  08/29/2012     metoprolol tartrate 25 MG tablet  Commonly known as:  LOPRESSOR  Take 1 tablet (25 mg total) by mouth 2 (two) times daily.  Start taking on:  08/29/2012     multivitamin with minerals Tabs  Take 1 tablet by mouth daily.     promethazine 25 MG suppository  Commonly known as:  PHENERGAN  Place 0.5 suppositories (12.5 mg total) rectally every 6 (six) hours as needed.     rosuvastatin 20 MG tablet  Commonly known as:  CRESTOR  Take 20 mg by mouth daily.     traMADol 50 MG tablet  Commonly known as:  ULTRAM  Take 1 tablet (50 mg total) by mouth every 6 (six) hours as needed.     VITAMIN D PO  Take 1 capsule by mouth daily.         Tramadol #30 No Refill  Disposition: home  Patient's condition: is Good  Follow up: 1. Dr.  Darrick Penna in 2 weeks.   Doreatha Massed, PA-C [facility] [phone]  --- For [facility] use --- Instructions: Press F2 to tab through selections.  Delete question if not applicable.   Modified Rankin score at D/C (0-6): 0  IV medication needed for:  1. Hypertension: No 2. Hypotension: Yes  Post-op Complications: No  1. Post-op CVA or TIA: No  If yes: Event classification (right eye, left  eye, right cortical, left cortical, verterobasilar, other): n/a  If yes: Timing of event (intra-op, <6 hrs post-op, >=6 hrs post-op, unknown): n/a  2. CN injury: No  If yes: CN n/a injuried   3. Myocardial infarction: No  If yes: Dx by (EKG or clinical,  Troponin): n/a  4.  CHF: No  5.  Dysrhythmia (new): No  6. Wound infection: No  7. Reperfusion symptoms: No  8. Return to OR: No  If yes: return to OR for (bleeding, neurologic, other CEA incision, other): n/a  Discharge medications: Statin use:  Yes If No: [ ]  For Medical reasons, [ ]  Non-compliant, [ ]  Not-indicated ASA use:  Yes  If No: [ ]  For Medical reasons, [ ]  Non-compliant, [ ]  Not-indicated Beta blocker use:  Yes If No: [ ]  For Medical reasons, [ ]  Non-compliant, [ ]  Not-indicated ACE-Inhibitor use:  No-on ARB If No: [ ]  For Medical reasons, [ ]  Non-compliant, [ ]  Not-indicated P2Y12 Antagonist use: no, [ ]  Plavix, [ ]  Plasugrel, [ ]  Ticlopinine, [ ]  Ticagrelor, [ ]  Other, [ ]  No for medical reason, [ ]  Non-compliant, [ ]  Not-indicated Anti-coagulant use:  no, [ ]  Warfarin, [ ]  Rivaroxaban, [ ]  Dabigatran, [ ]  Other, [ ]  No for medical reason, [ ]  Non-compliant, [ ]  Not-indicated

## 2012-08-26 NOTE — Progress Notes (Addendum)
Patient has received another 1liter bolus of NS this AM after blood pressure dropping into the 80's. After this bolus blood pressure has been trending back down into the 80's. Patient remains asymothatic. Will let vascular know and wait on further orders.After speaking to Dr. Darrick Penna received orders to resume dopamine at , and decreased IV fluid down to 10cc/hr.  Rechecked blood pressure in the left arm is 110/49 (63) and the right arm reads at 96/46(57). Will be checking the blood pressure in the left arm. Patient will remain on 3300 tonight.

## 2012-08-26 NOTE — Telephone Encounter (Signed)
Message copied by Jena Gauss on Tue Aug 26, 2012 10:43 AM ------      Message from: Sharee Pimple      Created: Tue Aug 26, 2012  7:48 AM      Regarding: schedule                   ----- Message -----         From: Dara Lords, PA-C         Sent: 08/25/2012   9:35 AM           To: Sharee Pimple, CMA            S/p right CEA 08/25/12. F/u with Dr. Darrick Penna in 2 weeks.            Thanks,      Lelon Mast ------

## 2012-08-26 NOTE — Progress Notes (Signed)
Dopamine gtt turned back on and pt NS restarted at 75cc/hr

## 2012-08-26 NOTE — Progress Notes (Addendum)
[facility] Progress Note  08/26/2012 7:24 AM 1 Day Post-Op  Subjective:  States she is feeling okay.  Denies any trouble swallowing. Per RN, she was nauseated and vomiting through most of the night.  Tm 99.2 now 98.8 80's-110's systolic HR 50's-70's SB 93% 3LO2NC  GTTS: Dopamine 56mcg/kg/min-restarted after walking and BP down to 80 systolic  Filed Vitals:   08/26/12 0719  BP:   Pulse:   Temp: 98.8 F (37.1 C)  Resp:      Physical Exam: Neuro:  In tact Incision:  C/d/i with some ecchymosis around incision.  CBC    Component Value Date/Time   WBC 6.5 08/26/2012 0340   RBC 3.48* 08/26/2012 0340   HGB 11.7* 08/26/2012 0340   HCT 33.6* 08/26/2012 0340   PLT 191 08/26/2012 0340   MCV 96.6 08/26/2012 0340   MCH 33.6 08/26/2012 0340   MCHC 34.8 08/26/2012 0340   RDW 12.1 08/26/2012 0340   LYMPHSABS 2.9 06/11/2011 2228   MONOABS 0.7 06/11/2011 2228   EOSABS 0.1 06/11/2011 2228   BASOSABS 0.0 06/11/2011 2228    BMET    Component Value Date/Time   NA 140 08/26/2012 0340   K 3.4* 08/26/2012 0340   CL 104 08/26/2012 0340   CO2 30 08/26/2012 0340   GLUCOSE 111* 08/26/2012 0340   BUN 10 08/26/2012 0340   CREATININE 0.75 08/26/2012 0340   CALCIUM 8.1* 08/26/2012 0340   GFRNONAA 80* 08/26/2012 0340   GFRAA >90 08/26/2012 0340     Intake/Output Summary (Last 24 hours) at 08/26/12 0724 Last data filed at 08/26/12 0700  Gross per 24 hour  Intake   1830 ml  Output    326 ml  Net   1504 ml      Assessment/Plan:  This is a 79 y.o. female who is s/p right CEA 1 Day Post-Op  -pt is doing well this am, however, she was nauseated and vomited throughout the night-her IVF was increased back to 75cc/hr -pt neuro exam is in tact -acute surgical blood loss anemia-hgb from 14.4 down to 11.7 -pt has ambulated -after ambulating this am, shen getting back to chair, systolic BP down to 80 and dopamine restarted at 3 mcg -wean as tolerated -pt de-sats while walking as well as in  chair-on 3L O2 -probably will need another day   Doreatha Massed, PA-C [facility] [phone]   Some nausea last night Still on dopamine for BP support Hypotension asymptomatic Hold BP meds Wean dopamine Ambulate  Incision clean no hematoma neuro exam UE/LE 5/5 motor swallow intact D/c later today if off dopamine and SBP above 100  Fabienne Bruns, MD [facility] [place] Office: [phone] Pager: [phone]

## 2012-08-26 NOTE — Progress Notes (Signed)
Utilization review completed.  

## 2012-08-27 MED ORDER — PROMETHAZINE HCL 25 MG RE SUPP: 12.5000 mg | Freq: Four times a day (QID) | RECTAL | Status: DC | PRN

## 2012-08-27 MED ORDER — LOSARTAN POTASSIUM 100 MG PO TABS: 50.0000 mg | ORAL_TABLET | Freq: Every day | ORAL | Status: DC

## 2012-08-27 MED ORDER — METOPROLOL TARTRATE 25 MG PO TABS: 25.0000 mg | ORAL_TABLET | Freq: Two times a day (BID) | ORAL | Status: AC

## 2012-08-27 MED ORDER — HYDROCHLOROTHIAZIDE 25 MG PO TABS: 25.0000 mg | ORAL_TABLET | Freq: Every day | ORAL | Status: DC

## 2012-08-27 MED ORDER — LOSARTAN POTASSIUM 100 MG PO TABS: 100.0000 mg | ORAL_TABLET | Freq: Every day | ORAL | Status: DC

## 2012-08-27 MED ORDER — PROMETHAZINE HCL 25 MG RE SUPP: 25.0000 mg | Freq: Four times a day (QID) | RECTAL | Status: DC | PRN

## 2012-08-27 NOTE — Progress Notes (Signed)
Discharge instructions given to pt and daughter in law.  Both verbalized understanding with all questions answered.  Pt discharged to home with son and daughter in law.  Roselie Awkward, RN

## 2012-08-27 NOTE — Progress Notes (Addendum)
To pt's room to check on her to see if she can be discharged.  She has remained off of the dopamine and is doing well.  She does c/o a red streak up her left arm just above the IV site.  The IV site is not red.  She was sitting with her arm against the BP tubing, which is exactly where the redness is.  Per RN, the packaging for BP cuff does not say latex free.    Instructed pt that she can place a warm washcloth to area tid as needed.  She will contact us if this gets worse.  Instructed pt to check BP on Friday before resuming her BP meds-if systolic is above 120, then she can take her BP meds-otherwise, continue to hold.  Pt and family member express understanding.  Doreatha Massed 08/27/2012 12:48 PM

## 2012-08-27 NOTE — Progress Notes (Addendum)
[facility] Progress Note  08/27/2012 7:22 AM 2 Days Post-Op  Subjective:  "I feel sleepy this morning"  Afebrile HR 50's-70's 90's-120's systolic.  Off of dopamine for about 30 minutes BP is 99 systolic 98% RA  Filed Vitals:   08/27/12 0500  BP: 110/47  Pulse:   Temp:   Resp: 21     Physical Exam: Neuro:  In tact Incision:  C/d/i with ecchymosis around incision.  CBC    Component Value Date/Time   WBC 6.5 08/26/2012 0340   RBC 3.48* 08/26/2012 0340   HGB 11.7* 08/26/2012 0340   HCT 33.6* 08/26/2012 0340   PLT 191 08/26/2012 0340   MCV 96.6 08/26/2012 0340   MCH 33.6 08/26/2012 0340   MCHC 34.8 08/26/2012 0340   RDW 12.1 08/26/2012 0340   LYMPHSABS 2.9 06/11/2011 2228   MONOABS 0.7 06/11/2011 2228   EOSABS 0.1 06/11/2011 2228   BASOSABS 0.0 06/11/2011 2228    BMET    Component Value Date/Time   NA 140 08/26/2012 0340   K 3.4* 08/26/2012 0340   CL 104 08/26/2012 0340   CO2 30 08/26/2012 0340   GLUCOSE 111* 08/26/2012 0340   BUN 10 08/26/2012 0340   CREATININE 0.75 08/26/2012 0340   CALCIUM 8.1* 08/26/2012 0340   GFRNONAA 80* 08/26/2012 0340   GFRAA >90 08/26/2012 0340     Intake/Output Summary (Last 24 hours) at 08/27/12 0722 Last data filed at 08/27/12 0500  Gross per 24 hour  Intake 1818.51 ml  Output   1050 ml  Net 768.51 ml      Assessment/Plan:  This is a 79 y.o. female who is s/p right CEA 2 Days Post-Op  -pt is doing well this am.  Pt has been off dopamine for about 30 minutes and systolic is 99-continue to monitor -pt neuro exam is in tact -pt has ambulated -if pt is discharged today, will have her resume her BP meds on Friday (lopressor, losartan, and HCTZ)    Doreatha Massed, PA-C [facility] [phone]   Desats some with coughing spells but otherwise ok.  Coughing is chronic and exhaustive workup in past BP currently above 100 no dizzyness, off Dopamine Incision healing, neuro intact  D/c home this afternoon if  stays of Dopamine Pt would prefer phenergan if nausea recurs  Fabienne Bruns, MD [facility] [place] Office: [phone] Pager: [phone]

## 2012-09-03 ENCOUNTER — Encounter: Payer: Self-pay | Admitting: Vascular Surgery

## 2012-09-04 ENCOUNTER — Encounter: Payer: Self-pay | Admitting: Vascular Surgery

## 2012-09-04 ENCOUNTER — Ambulatory Visit (INDEPENDENT_AMBULATORY_CARE_PROVIDER_SITE_OTHER): Payer: Medicare Other | Admitting: Vascular Surgery

## 2012-09-04 DIAGNOSIS — Z48812 Encounter for surgical aftercare following surgery on the circulatory system: Secondary | ICD-10-CM | POA: Insufficient documentation

## 2012-09-04 DIAGNOSIS — I6529 Occlusion and stenosis of unspecified carotid artery: Secondary | ICD-10-CM

## 2012-09-04 NOTE — Progress Notes (Signed)
Patient is status post right carotid endarterectomy on July 21. She had some postoperative nausea but otherwise has recovered well. She denies any symptoms of TIA amaurosis or stroke. She has no incisional drainage. She is continuing her aspirin.  Physical exam:  Filed Vitals:   09/04/12 0900 09/04/12 0903  BP: 152/65 145/58  Pulse: 68 68  Temp: 98.2 F (36.8 C)   TempSrc: Oral   Resp: 16   Height: 5\' 2"  (1.575 m)   Weight: 163 lb (73.936 kg)   SpO2: 98%    Neck: Soft left carotid bruit, right neck incision healing well  Neuro: Tongue midline no facial droop swallow intact  Assessment: Doing well post right carotid endarterectomy Plan: Continue aspirin followup carotid duplex scan in 6 months for a known moderate left carotid stenosis and to check for right-sided restenosis  Fabienne Bruns, MD [facility] [place] Office: [phone] Pager: [phone]

## 2012-09-04 NOTE — Addendum Note (Signed)
Addended by: Sharee Pimple on: 09/04/2012 04:02 PM   Modules accepted: Orders

## 2012-09-16 DIAGNOSIS — M25519 Pain in unspecified shoulder: Secondary | ICD-10-CM | POA: Diagnosis not present

## 2012-09-19 ENCOUNTER — Other Ambulatory Visit: Payer: Self-pay

## 2012-09-19 DIAGNOSIS — Z1231 Encounter for screening mammogram for malignant neoplasm of breast: Secondary | ICD-10-CM

## 2012-09-23 DIAGNOSIS — M75120 Complete rotator cuff tear or rupture of unspecified shoulder, not specified as traumatic: Secondary | ICD-10-CM | POA: Diagnosis not present

## 2012-09-23 DIAGNOSIS — M25519 Pain in unspecified shoulder: Secondary | ICD-10-CM | POA: Diagnosis not present

## 2012-09-25 DIAGNOSIS — M25519 Pain in unspecified shoulder: Secondary | ICD-10-CM | POA: Diagnosis not present

## 2012-09-25 DIAGNOSIS — M75120 Complete rotator cuff tear or rupture of unspecified shoulder, not specified as traumatic: Secondary | ICD-10-CM | POA: Diagnosis not present

## 2012-09-30 DIAGNOSIS — M75120 Complete rotator cuff tear or rupture of unspecified shoulder, not specified as traumatic: Secondary | ICD-10-CM | POA: Diagnosis not present

## 2012-09-30 DIAGNOSIS — M25519 Pain in unspecified shoulder: Secondary | ICD-10-CM | POA: Diagnosis not present

## 2012-10-01 DIAGNOSIS — I2510 Atherosclerotic heart disease of native coronary artery without angina pectoris: Secondary | ICD-10-CM | POA: Diagnosis not present

## 2012-10-01 DIAGNOSIS — I6529 Occlusion and stenosis of unspecified carotid artery: Secondary | ICD-10-CM | POA: Diagnosis not present

## 2012-10-01 DIAGNOSIS — E782 Mixed hyperlipidemia: Secondary | ICD-10-CM | POA: Diagnosis not present

## 2012-10-02 DIAGNOSIS — M25519 Pain in unspecified shoulder: Secondary | ICD-10-CM | POA: Diagnosis not present

## 2012-10-02 DIAGNOSIS — M75120 Complete rotator cuff tear or rupture of unspecified shoulder, not specified as traumatic: Secondary | ICD-10-CM | POA: Diagnosis not present

## 2012-10-07 DIAGNOSIS — M25519 Pain in unspecified shoulder: Secondary | ICD-10-CM | POA: Diagnosis not present

## 2012-10-07 DIAGNOSIS — M75120 Complete rotator cuff tear or rupture of unspecified shoulder, not specified as traumatic: Secondary | ICD-10-CM | POA: Diagnosis not present

## 2012-10-09 DIAGNOSIS — M25519 Pain in unspecified shoulder: Secondary | ICD-10-CM | POA: Diagnosis not present

## 2012-10-09 DIAGNOSIS — M75120 Complete rotator cuff tear or rupture of unspecified shoulder, not specified as traumatic: Secondary | ICD-10-CM | POA: Diagnosis not present

## 2012-10-10 DIAGNOSIS — M25519 Pain in unspecified shoulder: Secondary | ICD-10-CM | POA: Diagnosis not present

## 2012-10-14 DIAGNOSIS — M75120 Complete rotator cuff tear or rupture of unspecified shoulder, not specified as traumatic: Secondary | ICD-10-CM | POA: Diagnosis not present

## 2012-10-14 DIAGNOSIS — M25519 Pain in unspecified shoulder: Secondary | ICD-10-CM | POA: Diagnosis not present

## 2012-10-16 DIAGNOSIS — M75120 Complete rotator cuff tear or rupture of unspecified shoulder, not specified as traumatic: Secondary | ICD-10-CM | POA: Diagnosis not present

## 2012-10-16 DIAGNOSIS — M25519 Pain in unspecified shoulder: Secondary | ICD-10-CM | POA: Diagnosis not present

## 2012-10-16 DIAGNOSIS — Z4789 Encounter for other orthopedic aftercare: Secondary | ICD-10-CM | POA: Diagnosis not present

## 2012-11-04 ENCOUNTER — Ambulatory Visit
Admission: RE | Admit: 2012-11-04 | Discharge: 2012-11-04 | Disposition: A | Discharge status: Home / Self Care | Payer: Medicare Other | Source: Ambulatory Visit

## 2012-11-04 DIAGNOSIS — Z1231 Encounter for screening mammogram for malignant neoplasm of breast: Secondary | ICD-10-CM

## 2012-11-07 DIAGNOSIS — I10 Essential (primary) hypertension: Secondary | ICD-10-CM | POA: Diagnosis not present

## 2012-11-07 DIAGNOSIS — I259 Chronic ischemic heart disease, unspecified: Secondary | ICD-10-CM | POA: Diagnosis not present

## 2012-11-07 DIAGNOSIS — R7309 Other abnormal glucose: Secondary | ICD-10-CM | POA: Diagnosis not present

## 2012-11-13 DIAGNOSIS — R7309 Other abnormal glucose: Secondary | ICD-10-CM | POA: Diagnosis not present

## 2012-11-13 DIAGNOSIS — E7800 Pure hypercholesterolemia, unspecified: Secondary | ICD-10-CM | POA: Diagnosis not present

## 2012-11-13 DIAGNOSIS — I10 Essential (primary) hypertension: Secondary | ICD-10-CM | POA: Diagnosis not present

## 2012-11-13 DIAGNOSIS — I259 Chronic ischemic heart disease, unspecified: Secondary | ICD-10-CM | POA: Diagnosis not present

## 2012-12-03 DIAGNOSIS — L219 Seborrheic dermatitis, unspecified: Secondary | ICD-10-CM | POA: Diagnosis not present

## 2012-12-03 DIAGNOSIS — D235 Other benign neoplasm of skin of trunk: Secondary | ICD-10-CM | POA: Diagnosis not present

## 2012-12-03 DIAGNOSIS — D1801 Hemangioma of skin and subcutaneous tissue: Secondary | ICD-10-CM | POA: Diagnosis not present

## 2012-12-29 DIAGNOSIS — L678 Other hair color and hair shaft abnormalities: Secondary | ICD-10-CM | POA: Diagnosis not present

## 2012-12-29 DIAGNOSIS — L738 Other specified follicular disorders: Secondary | ICD-10-CM | POA: Diagnosis not present

## 2013-02-02 ENCOUNTER — Telehealth: Payer: Self-pay | Admitting: Vascular Surgery

## 2013-02-02 NOTE — Telephone Encounter (Signed)
Message copied by Rudean Haskell on Mon Feb 02, 2013  1:36 PM ------      Message from: Fredrich Birks      Created: Fri Jan 30, 2013  3:12 PM      Regarding: ? repeat study       [name] would like a call back regarding her appointment with the vascular lab on 03/10/2013 for a carotid ultrasound and to see [name]. She said that her cardiologist is now following her carotid. She wanted to know if it is necessary to repeat it, and if not, should she at least come to see the Nurse Practitioner for follow up.            She can be reached at [phone]. She called last week, and after trying to find the best answer for her, I was instructed to forward the vlab question to you.....Adrienne Kitchensorry!            Thanks,      [name] ------

## 2013-02-02 NOTE — Telephone Encounter (Signed)
Advised [name] that Dr. Darrick Penna would recommend she keep her appointment here on 03/10/13, with it being her first ultrasound following her surgery.  As long as the ultrasound is stable she could have any other follow-up ultrasounds at her place of preference.  She understood and will keep her appointments here on 03/10/2013.

## 2013-02-17 DIAGNOSIS — I10 Essential (primary) hypertension: Secondary | ICD-10-CM | POA: Diagnosis not present

## 2013-02-17 DIAGNOSIS — R7309 Other abnormal glucose: Secondary | ICD-10-CM | POA: Diagnosis not present

## 2013-02-20 DIAGNOSIS — M25519 Pain in unspecified shoulder: Secondary | ICD-10-CM | POA: Diagnosis not present

## 2013-02-20 DIAGNOSIS — R5381 Other malaise: Secondary | ICD-10-CM | POA: Diagnosis not present

## 2013-02-20 DIAGNOSIS — R5383 Other fatigue: Secondary | ICD-10-CM | POA: Diagnosis not present

## 2013-02-20 DIAGNOSIS — E559 Vitamin D deficiency, unspecified: Secondary | ICD-10-CM | POA: Diagnosis not present

## 2013-02-20 DIAGNOSIS — M810 Age-related osteoporosis without current pathological fracture: Secondary | ICD-10-CM | POA: Diagnosis not present

## 2013-02-23 DIAGNOSIS — I2510 Atherosclerotic heart disease of native coronary artery without angina pectoris: Secondary | ICD-10-CM | POA: Diagnosis not present

## 2013-02-23 DIAGNOSIS — I10 Essential (primary) hypertension: Secondary | ICD-10-CM | POA: Diagnosis not present

## 2013-02-23 DIAGNOSIS — M810 Age-related osteoporosis without current pathological fracture: Secondary | ICD-10-CM | POA: Diagnosis not present

## 2013-02-23 DIAGNOSIS — R7309 Other abnormal glucose: Secondary | ICD-10-CM | POA: Diagnosis not present

## 2013-03-04 DIAGNOSIS — M810 Age-related osteoporosis without current pathological fracture: Secondary | ICD-10-CM | POA: Diagnosis not present

## 2013-03-04 DIAGNOSIS — E559 Vitamin D deficiency, unspecified: Secondary | ICD-10-CM | POA: Diagnosis not present

## 2013-03-09 ENCOUNTER — Encounter: Payer: Self-pay | Admitting: Family

## 2013-03-10 ENCOUNTER — Ambulatory Visit (INDEPENDENT_AMBULATORY_CARE_PROVIDER_SITE_OTHER): Payer: Medicare Other | Admitting: Family

## 2013-03-10 ENCOUNTER — Encounter: Payer: Self-pay | Admitting: Family

## 2013-03-10 ENCOUNTER — Ambulatory Visit (HOSPITAL_COMMUNITY)
Admission: RE | Admit: 2013-03-10 | Discharge: 2013-03-10 | Disposition: A | Discharge status: Home / Self Care | Payer: Medicare Other | Source: Ambulatory Visit | Attending: Family | Admitting: Family

## 2013-03-10 VITALS — BP 86/43 | HR 63 | Resp 16 | Ht 62.0 in | Wt 163.0 lb

## 2013-03-10 DIAGNOSIS — Z48812 Encounter for surgical aftercare following surgery on the circulatory system: Secondary | ICD-10-CM | POA: Diagnosis not present

## 2013-03-10 DIAGNOSIS — Y838 Other surgical procedures as the cause of abnormal reaction of the patient, or of later complication, without mention of misadventure at the time of the procedure: Secondary | ICD-10-CM | POA: Insufficient documentation

## 2013-03-10 DIAGNOSIS — I651 Occlusion and stenosis of basilar artery: Secondary | ICD-10-CM | POA: Insufficient documentation

## 2013-03-10 DIAGNOSIS — Z9889 Other specified postprocedural states: Secondary | ICD-10-CM | POA: Diagnosis not present

## 2013-03-10 DIAGNOSIS — T82898A Other specified complication of vascular prosthetic devices, implants and grafts, initial encounter: Secondary | ICD-10-CM | POA: Insufficient documentation

## 2013-03-10 DIAGNOSIS — I6529 Occlusion and stenosis of unspecified carotid artery: Secondary | ICD-10-CM

## 2013-03-10 NOTE — Patient Instructions (Signed)

## 2013-03-10 NOTE — Progress Notes (Signed)
Established Carotid Patient  History of Present Illness  [place] is a 80 y.o. female patient of Dr. Oneida Alar who is status post right carotid endarterectomy on August 25, 2012. She returns today for carotid artery surveillance.  Patient has Negative history of TIA or stroke symptom.  The patient denies amaurosis fugax or monocular blindness.  The patient  denies facial drooping.  Pt. denies hemiplegia.  The patient denies receptive or expressive aphasia.   She denies steal symptoms in either arm.  Pt denies claudication symptoms, denies non-healing wounds.  Pt  denies New Medical or Surgical History.  Pt Diabetic: No Pt smoker: non-smoker  Pt meds include: Statin : Yes Betablocker: Yes ASA: Yes Other anticoagulants/antiplatelets: no   Past Medical History  Diagnosis Date  . Coronary artery disease   . Hypercholesteremia   . Chronic cough   . Arthritis   . Peripheral vascular disease   . Internal carotid artery stenosis 08/2012  . Normal cardiac stress test 08/2012    @ Dr Irven Shelling office    Social History History  Substance Use Topics  . Smoking status: Never Smoker   . Smokeless tobacco: Not on file  . Alcohol Use: No    Family History No family history on file.  Surgical History Past Surgical History  Procedure Laterality Date  . Abdominal hysterectomy    . Appendectomy    . Eye surgery      both cataracts  . Orif ankle fracture  06/14/2011    Procedure: OPEN REDUCTION INTERNAL FIXATION (ORIF) ANKLE FRACTURE;  Surgeon: Ninetta Lights, MD;  Location: [facility];  Service: Orthopedics;  Laterality: Left;  left ankle fracture open treatment trimalleolar ankle includes internal fixation WITHOUT fixation of posterior lip  . Coronary artery bypass graft    . Shoulder arthroscopy with subacromial decompression, rotator cuff repair and bicep tendon repair Left 06/05/2012    Procedure: LEFT SHOULDER ARTHROSCOPY WITH SUBACROMIAL DECOMPRESSION,  PARTIAL ACROMIOPLASTY WITH CORACOACROMIAL RELEASE, DISTAL CLAVICULECTOMY, WITH ROTATOR CUFF REPAIR AND BICEP TENODESIS;  Surgeon: Ninetta Lights, MD;  Location: [facility];  Service: Orthopedics;  Laterality: Left;  . Endarterectomy Right 08/25/2012    Procedure: ENDARTERECTOMY CAROTID;  Surgeon: Elam Dutch, MD;  Location: [facility];  Service: Vascular;  Laterality: Right;    Allergies  Allergen Reactions  . Hydrocodone Shortness Of Breath    Rash-itching-tongue swelled  . Fenofibrate Other (See Comments)    Caused every joint to become stif  . Oxycodone Swelling    Rash-itching  . Latex Rash and Other (See Comments)    "Blisters in mouth"    Current Outpatient Prescriptions  Medication Sig Dispense Refill  . aspirin 81 MG tablet Take 81 mg by mouth daily.      . hydrochlorothiazide (HYDRODIURIL) 25 MG tablet Take 1 tablet (25 mg total) by mouth daily.      Marland Kitchen losartan (COZAAR) 100 MG tablet Take 0.5 tablets (50 mg total) by mouth daily.      . metoprolol tartrate (LOPRESSOR) 25 MG tablet Take 1 tablet (25 mg total) by mouth 2 (two) times daily.      . rosuvastatin (CRESTOR) 20 MG tablet Take 20 mg by mouth daily.      Marland Kitchen albuterol (PROVENTIL HFA;VENTOLIN HFA) 108 (90 BASE) MCG/ACT inhaler Inhale 2 puffs into the lungs every 6 (six) hours as needed. For wheezing/cough      . Cholecalciferol (VITAMIN D PO) Take 1 capsule by mouth daily.      Marland Kitchen  citalopram (CELEXA) 20 MG tablet Take 1 tablet by mouth daily.      . fenofibrate micronized (ANTARA) 43 MG capsule Take 1 capsule by mouth daily.      Javier Docker Oil 300 MG CAPS Take 1 capsule by mouth daily.      . Multiple Vitamin (MULITIVITAMIN WITH MINERALS) TABS Take 1 tablet by mouth daily.      . promethazine (PHENERGAN) 25 MG suppository Place 0.5 suppositories (12.5 mg total) rectally every 6 (six) hours as needed.  4 each  0  . traMADol (ULTRAM) 50 MG tablet Take 1 tablet (50 mg total) by mouth every 6 (six) hours as needed.   30 tablet  0   No current facility-administered medications for this visit.    Review of Systems : See HPI for pertinent positives and negatives.  Physical Examination  Filed Vitals:   03/10/13 1240  BP: 86/43  Pulse: 63  Resp: 16   Filed Weights   03/10/13 1240  Weight: 163 lb (73.936 kg)   Body mass index is 29.81 kg/(m^2).  General: WDWN female in NAD GAIT: normal Eyes: PERRLA Pulmonary:  CTAB, Negative  Rales, Negative rhonchi, & Negative wheezing.  Cardiac: regular Rhythm ,  Negative detected Murmurs.  VASCULAR EXAM Carotid Bruits Left Right   Soft bruit Soft bruit   Radial pulses are 1+ right, 2+ left palpable.                                                                                                                            LE Pulses LEFT RIGHT       POPLITEAL  not palpable   not palpable       POSTERIOR TIBIAL  not palpable   not palpable        DORSALIS PEDIS      ANTERIOR TIBIAL  palpable   palpable     Gastrointestinal: soft, nontender, BS WNL, no r/g,  negative masses.  Musculoskeletal: Negative muscle atrophy/wasting. M/S 4/5 throughout, Extremities without ischemic changes.  Neurologic: A&O X 3; Appropriate Affect ; SENSATION ;normal;  Speech is normal CN 2-12 intact , Pain and light touch intact in extremities, Motor exam as listed above.   Non-Invasive Vascular Imaging CAROTID DUPLEX 03/10/2013   CEREBROVASCULAR DUPLEX EVALUATION    INDICATION: Follow-up carotid disease     PREVIOUS INTERVENTION(S): Right carotid endarterectomy 08/25/2012    DUPLEX EXAM:     RIGHT  LEFT  Peak Systolic Velocities (cm/s) End Diastolic Velocities (cm/s) Plaque LOCATION Peak Systolic Velocities (cm/s) End Diastolic Velocities (cm/s) Plaque  89 17  CCA PROXIMAL 137 33   70 20  CCA MID 114 29   388 114 HM CCA DISTAL 110 26   681 116 HM ECA 418 45 HT  532 103 HM ICA PROXIMAL 125 35 HT  108 24  ICA MID 181 38   58 20  ICA DISTAL 95 24     NA ICA  /  CCA Ratio (PSV) 1.1  Antegrade  Vertebral Flow Antegrade   90 Brachial Systolic Pressure (mmHg) 90  Within normal limits  Brachial Artery Waveforms Within normal limits     Plaque Morphology:  HM = Homogeneous, HT = Heterogeneous, CP = Calcific Plaque, SP = Smooth Plaque, IP = Irregular Plaque     ADDITIONAL FINDINGS:     IMPRESSION: 1. Evidence of significant restenosis of the right carotid endarterectomy at the bifurcation extending into the external carotid artery and slightly into the internal carotid artery. 2. Evidence of <40% stenosis of the left internal carotid artery. 3. Significant stenosis is observed of the left external carotid artery. 4. Bilateral vertebral artery is antegrade.    Compared to the previous exam:  Despite surgical intervention, right carotid disease is markedly worse.     Assessment: [name] is a 80 y.o. female who presents with asymptomatic <40% stenosis of the left ICA and evidence of significant restenosis of the right carotid endarterectomy at the bifurcation extending into the external carotid artery and slightly into the internal carotid artery. The right  ICA stenosis is  Worse from previous exam.  Her atherosclerotic risk factors are obesity and lack of exercise, her other risk factors are addressed or not present. Discussed the Duplex results and results of pt exam with Dr. Kellie Simmering; the restenosis is likely post surgical intimal hyperplasia and not plaque. I hypotensive but denies dizziness with standing, but does have some dizziness if she turns her head quickly.  Plan: Based on today's exam and Duplex result, and after discussing with Dr. Kellie Simmering, follow-up on Thursday, February 12 with Dr. Oneida Alar to discuss possible arteriogram, possible right ICA stenting.   I discussed in depth with the patient the nature of atherosclerosis, and emphasized the importance of maximal medical management including strict control of blood pressure, blood  glucose, and lipid levels, obtaining regular exercise, and continued cessation of smoking.  The patient is aware that without maximal medical management the underlying atherosclerotic disease process will progress, limiting the benefit of any interventions. The patient was given information about stroke prevention and what symptoms should prompt the patient to seek immediate medical care. Thank you for allowing [place] to participate in this patient's care.  Clemon Chambers, RN, MSN, FNP-C [facility] [place] Office: [phone]  Clinic Physician: Early  03/10/2013 12:34 PM

## 2013-03-12 ENCOUNTER — Other Ambulatory Visit (HOSPITAL_COMMUNITY): Payer: Medicare Other

## 2013-03-12 ENCOUNTER — Ambulatory Visit: Payer: Medicare Other | Admitting: Family

## 2013-03-18 ENCOUNTER — Encounter: Payer: Self-pay | Admitting: Vascular Surgery

## 2013-03-19 ENCOUNTER — Encounter: Payer: Self-pay | Admitting: Vascular Surgery

## 2013-03-19 ENCOUNTER — Ambulatory Visit (INDEPENDENT_AMBULATORY_CARE_PROVIDER_SITE_OTHER): Payer: Medicare Other | Admitting: Vascular Surgery

## 2013-03-19 VITALS — BP 127/73 | HR 55 | Resp 18 | Ht 62.0 in | Wt 168.2 lb

## 2013-03-19 DIAGNOSIS — Z48812 Encounter for surgical aftercare following surgery on the circulatory system: Secondary | ICD-10-CM | POA: Diagnosis not present

## 2013-03-19 DIAGNOSIS — I6529 Occlusion and stenosis of unspecified carotid artery: Secondary | ICD-10-CM

## 2013-03-19 NOTE — Progress Notes (Signed)
[facility] Progress Note  03/19/2013 1:12 PM @ORDAYSPST@  Subjective: Patient is a 79 year old female s/p right CEA (08/2012) who is here for follow-up after finding significant restenosis of the right carotid artery on cerebrovascular duplex on 03/10/2013. She is here to discuss possible arteriogram. She denies any temporary blindness and weakness in her extremities. She notes left arm numbness occasionally when she wakes up at night but this does not seem like a TIA symptom.  She is on aspirin.    Filed Vitals:   03/19/13 1248  BP: 127/73  Pulse: 55  Resp: 18    Physical Exam: Filed Vitals:   03/19/13 1248  BP: 127/73  Pulse: 55  Resp: 18  Height: 5' 2" (1.575 m)  Weight: 168 lb 3.2 oz (76.295 kg)   Neck: carotid bruits bilaterally, harsher on right  CBC    Component Value Date/Time   WBC 6.5 08/26/2012 0340   RBC 3.48* 08/26/2012 0340   HGB 11.7* 08/26/2012 0340   HCT 33.6* 08/26/2012 0340   PLT 191 08/26/2012 0340   MCV 96.6 08/26/2012 0340   MCH 33.6 08/26/2012 0340   MCHC 34.8 08/26/2012 0340   RDW 12.1 08/26/2012 0340   LYMPHSABS 2.9 06/11/2011 2228   MONOABS 0.7 06/11/2011 2228   EOSABS 0.1 06/11/2011 2228   BASOSABS 0.0 06/11/2011 2228    BMET    Component Value Date/Time   NA 140 08/26/2012 0340   K 3.4* 08/26/2012 0340   CL 104 08/26/2012 0340   CO2 30 08/26/2012 0340   GLUCOSE 111* 08/26/2012 0340   BUN 10 08/26/2012 0340   CREATININE 0.75 08/26/2012 0340   CALCIUM 8.1* 08/26/2012 0340   GFRNONAA 80* 08/26/2012 0340   GFRAA >90 08/26/2012 0340    INR    Component Value Date/Time   INR 0.94 08/22/2012 1246     Assessment:  79 y.o. female is s/p right CEA 08/2012. -Restenosis of right carotid artery.  Plan: -Plan for arteriogram on 04/01/13 with Dr. Fields. -Will plan to stent or repeat CEA after arteriogram.     Chai Routh, Student PA [facility] [phone] 03/19/2013 1:12 PM  History and exam details as above.    Procedure risks benefits discussed with pt.  Charles Fields, MD [facility] [place] Office: [phone] Pager: [phone]  

## 2013-03-27 ENCOUNTER — Encounter (HOSPITAL_COMMUNITY): Payer: Self-pay | Admitting: Pharmacy Technician

## 2013-03-27 ENCOUNTER — Other Ambulatory Visit: Payer: Self-pay

## 2013-03-31 MED ORDER — SODIUM CHLORIDE 0.9 % IV SOLN
INTRAVENOUS | Status: DC
  Administered 2013-04-01: 07:00:00 via INTRAVENOUS

## 2013-04-01 ENCOUNTER — Encounter (HOSPITAL_COMMUNITY)
Admission: RE | Disposition: A | Discharge status: Home / Self Care | Payer: Self-pay | Source: Ambulatory Visit | Attending: Vascular Surgery

## 2013-04-01 ENCOUNTER — Ambulatory Visit (HOSPITAL_COMMUNITY)
Admission: RE | Admit: 2013-04-01 | Discharge: 2013-04-01 | Disposition: A | Discharge status: Home / Self Care | Payer: Medicare Other | Source: Ambulatory Visit | Attending: Vascular Surgery | Admitting: Vascular Surgery

## 2013-04-01 DIAGNOSIS — I6529 Occlusion and stenosis of unspecified carotid artery: Secondary | ICD-10-CM | POA: Diagnosis not present

## 2013-04-01 DIAGNOSIS — Z7982 Long term (current) use of aspirin: Secondary | ICD-10-CM | POA: Insufficient documentation

## 2013-04-01 HISTORY — PX: CAROTID ANGIOGRAM: SHX5504

## 2013-04-01 LAB — POCT I-STAT, CHEM 8
BUN: 14 mg/dL (ref 6–23)
Calcium, Ion: 1.22 mmol/L (ref 1.13–1.30)
Chloride: 102 mEq/L (ref 96–112)
Creatinine, Ser: 0.8 mg/dL (ref 0.50–1.10)
Glucose, Bld: 99 mg/dL (ref 70–99)
HCT: 42 % (ref 36.0–46.0)
Hemoglobin: 14.3 g/dL (ref 12.0–15.0)
Potassium: 4.1 mEq/L (ref 3.7–5.3)
Sodium: 142 mEq/L (ref 137–147)
TCO2: 28 mmol/L (ref 0–100)

## 2013-04-01 SURGERY — CAROTID ANGIOGRAM
Anesthesia: LOCAL

## 2013-04-01 MED ORDER — HEPARIN (PORCINE) IN NACL 2-0.9 UNIT/ML-% IJ SOLN
INTRAMUSCULAR | Status: AC
  Filled 2013-04-01: qty 1000

## 2013-04-01 MED ORDER — HYDRALAZINE HCL 20 MG/ML IJ SOLN: 10.0000 mg | INTRAMUSCULAR | Status: DC | PRN

## 2013-04-01 MED ORDER — HYDROMORPHONE HCL PF 1 MG/ML IJ SOLN: 0.5000 mg | INTRAMUSCULAR | Status: DC | PRN

## 2013-04-01 MED ORDER — LABETALOL HCL 5 MG/ML IV SOLN: 10.0000 mg | INTRAVENOUS | Status: DC | PRN

## 2013-04-01 MED ORDER — LIDOCAINE HCL (PF) 1 % IJ SOLN
INTRAMUSCULAR | Status: AC
  Filled 2013-04-01: qty 30

## 2013-04-01 MED ORDER — CLOPIDOGREL BISULFATE 300 MG PO TABS: 300.0000 mg | ORAL_TABLET | Freq: Once | ORAL | Status: DC

## 2013-04-01 MED ORDER — METOPROLOL TARTRATE 1 MG/ML IV SOLN: 2.0000 mg | INTRAVENOUS | Status: DC | PRN

## 2013-04-01 MED ORDER — ONDANSETRON HCL 4 MG/2ML IJ SOLN: 4.0000 mg | Freq: Four times a day (QID) | INTRAMUSCULAR | Status: DC | PRN

## 2013-04-01 MED ORDER — CLOPIDOGREL BISULFATE 75 MG PO TABS
75.0000 mg | ORAL_TABLET | Freq: Every day | ORAL | Status: DC
Start: 2013-04-02 — End: 2013-04-01

## 2013-04-01 MED ORDER — ACETAMINOPHEN 325 MG RE SUPP: 325.0000 mg | RECTAL | Status: DC | PRN

## 2013-04-01 MED ORDER — TRAMADOL HCL 50 MG PO TABS: 50.0000 mg | ORAL_TABLET | Freq: Four times a day (QID) | ORAL | Status: DC | PRN

## 2013-04-01 MED ORDER — ACETAMINOPHEN 325 MG PO TABS: 325.0000 mg | ORAL_TABLET | ORAL | Status: DC | PRN

## 2013-04-01 MED ORDER — CLOPIDOGREL BISULFATE 75 MG PO TABS: 75.0000 mg | ORAL_TABLET | Freq: Every day | ORAL | Status: DC

## 2013-04-01 MED ORDER — SODIUM CHLORIDE 0.45 % IV SOLN: INTRAVENOUS | Status: DC

## 2013-04-01 NOTE — Discharge Instructions (Signed)
Angiography, Care After °Refer to this sheet in the next few weeks. These instructions provide you with information on caring for yourself after your procedure. Your health care provider may also give you more specific instructions. Your treatment has been planned according to current medical practices, but problems sometimes occur. Call your health care provider if you have any problems or questions after your procedure.  °WHAT TO EXPECT AFTER THE PROCEDURE °After your procedure, it is typical to have the following sensations: °· Minor discomfort or tenderness and a small bump at the catheter insertion site. The bump should usually decrease in size and tenderness within 1 to 2 weeks. °· Any bruising will usually fade within 2 to 4 weeks. °HOME CARE INSTRUCTIONS  °· You may need to keep taking blood thinners if they were prescribed for you. Only take over-the-counter or prescription medicines for pain, fever, or discomfort as directed by your health care provider. °· Do not apply powder or lotion to the site. °· Do not sit in a bathtub, swimming pool, or whirlpool for 5 to 7 days. °· You may shower 24 hours after the procedure. Remove the bandage (dressing) and gently wash the site with plain soap and water. Gently pat the site dry. °· Inspect the site at least twice daily. °· Limit your activity for the first 48 hours. Do not bend, squat, or lift anything over 20 lb (9 kg) or as directed by your health care provider. °· Do not drive home if you are discharged the day of the procedure. Have someone else drive you. Follow instructions about when you can drive or return to work. °SEEK MEDICAL CARE IF: °· You get lightheaded when standing up. °· You have drainage (other than a small amount of blood on the dressing). °· You have chills. °· You have a fever. °· You have redness, warmth, swelling, or pain at the insertion site. °SEEK IMMEDIATE MEDICAL CARE IF:  °· You develop chest pain or shortness of breath, feel faint,  or pass out. °· You have bleeding, swelling larger than a walnut, or drainage from the catheter insertion site. °· You develop pain, discoloration, coldness, or severe bruising in the leg or arm that held the catheter. °· You develop bleeding from any other place, such as the bowels. You may see bright red blood in your urine or stools, or your stools may appear black and tarry. °· You have heavy bleeding from the site. If this happens, hold pressure on the site. °MAKE SURE YOU: °· Understand these instructions. °· Will watch your condition. °· Will get help right away if you are not doing well or get worse. °Document Released: 08/10/2004 Document Revised: 09/24/2012 Document Reviewed: 06/16/2012 °ExitCare® Patient Information ©2014 [employer]. ° °

## 2013-04-01 NOTE — Progress Notes (Signed)
Discharge instruction given per MD order.  Pt and Cg able to verbalize understanding.  Son filled RX given.  Pt to car via wheelchair.

## 2013-04-01 NOTE — Progress Notes (Signed)
Received from cath lab via stretcher.  R groin site CDI with dressing.  Post procedure orders reviewed.  Po's offered.  Family at bedside.  Call bell in reach.

## 2013-04-01 NOTE — H&P (View-Only) (Signed)
[facility] Progress Note  03/19/2013 1:12 PM @ORDAYSPST @  Subjective: Patient is a 80 year old female s/p right CEA (08/2012) who is here for follow-up after finding significant restenosis of the right carotid artery on cerebrovascular duplex on 03/10/2013. She is here to discuss possible arteriogram. She denies any temporary blindness and weakness in her extremities. She notes left arm numbness occasionally when she wakes up at night but this does not seem like a TIA symptom.  She is on aspirin.    Filed Vitals:   03/19/13 1248  BP: 127/73  Pulse: 55  Resp: 18    Physical Exam: Filed Vitals:   03/19/13 1248  BP: 127/73  Pulse: 55  Resp: 18  Height: 5\' 2"  (1.575 m)  Weight: 168 lb 3.2 oz (76.295 kg)   Neck: carotid bruits bilaterally, harsher on right  CBC    Component Value Date/Time   WBC 6.5 08/26/2012 0340   RBC 3.48* 08/26/2012 0340   HGB 11.7* 08/26/2012 0340   HCT 33.6* 08/26/2012 0340   PLT 191 08/26/2012 0340   MCV 96.6 08/26/2012 0340   MCH 33.6 08/26/2012 0340   MCHC 34.8 08/26/2012 0340   RDW 12.1 08/26/2012 0340   LYMPHSABS 2.9 06/11/2011 2228   MONOABS 0.7 06/11/2011 2228   EOSABS 0.1 06/11/2011 2228   BASOSABS 0.0 06/11/2011 2228    BMET    Component Value Date/Time   NA 140 08/26/2012 0340   K 3.4* 08/26/2012 0340   CL 104 08/26/2012 0340   CO2 30 08/26/2012 0340   GLUCOSE 111* 08/26/2012 0340   BUN 10 08/26/2012 0340   CREATININE 0.75 08/26/2012 0340   CALCIUM 8.1* 08/26/2012 0340   GFRNONAA 80* 08/26/2012 0340   GFRAA >90 08/26/2012 0340    INR    Component Value Date/Time   INR 0.94 08/22/2012 1246     Assessment:  80 y.o. female is s/p right CEA 08/2012. -Restenosis of right carotid artery.  Plan: -Plan for arteriogram on 04/01/13 with Dr. Oneida Alar. -Will plan to stent or repeat CEA after arteriogram.     Virgina Jock, Student PA [facility] [phone] 03/19/2013 1:12 PM  History and exam details as above.    Procedure risks benefits discussed with pt.  Ruta Hinds, MD [facility] [place] Office: [phone] Pager: [phone]

## 2013-04-01 NOTE — Interval H&P Note (Signed)
History and Physical Interval Note:  04/01/2013 8:25 AM  [name]  has presented today for surgery, with the diagnosis of stenosis of right carotid endartectomy  The various methods of treatment have been discussed with the patient and family. After consideration of risks, benefits and other options for treatment, the patient has consented to  Procedure(s): CAROTID ANGIOGRAM (N/A) as a surgical intervention .  The patient's history has been reviewed, patient examined, no change in status, stable for surgery.  I have reviewed the patient's chart and labs.  Questions were answered to the patient's satisfaction.     [name]

## 2013-04-01 NOTE — Op Note (Addendum)
Procedure: Arch aortogram with bilateral carotid angiogram Preoperative diagnosis: high grade right internal carotid stenosis Postoperative diagnosis: 75% right internal carotid stenosis Anesthesia: Local   Operative details: After obtaining informed consent, the patient was taken to the [facility] lab. The patient was placed in supine position the Angio table. Both groins were prepped and draped in usual sterile fashion. Local anesthesia was infiltrated over the right common femoral artery. An introducer needle was placed into the right common femoral artery and there was good backbleeding from this.  Next and 0.035 Versacore wire was threaded up into the abdominal aorta and into the aortic arch under fluoroscopic guidance. A 5 French sheath was  the guidewire the right common femoral artery. This was thoroughly flushed with heparinized saline. A 5 French pigtail catheter was then placed through the guidewire advanced up in the aortic arch and arch aortogram obtained in a 40 LAO view. This shows normal arch configuration with type II configuration. The innominate left common carotid and left subclavian origins are all widely patent. The left and right vertebral arteries are patent with antegrade flow. The right common carotid artery is patent at its origin. The 5 French pigtail catheter was then pulled back over the guidewire and exchanged for a H1 catheter. This was then used to selectively catheterize the innominate artery followed by the right common carotid artery.  Injection was then performed through the right common carotid to the define the right carotid system. Projections were done in AP and lateral projection. Intracranial views were also performed to be interpreted later by the neuroradiologist.  The right internal has a 70% stenosis. This is best viewed on the lateral projection. The external carotid artery is widely patent.   An attempt was made to use the H1 catheter to selectively catheterize the  left common carotid artery. This was unsuccessful. The H1 catheter was exchanged over the guidewire for a Berenstein 2 catheter. I was able to advance this into the left common carotid artery.  After advancing the catheter into the left common carotid artery left carotid injection was performed also in AP and lateral projection. Intracranial views were also performed in AP and lateral projection to be interpreted by the neuroradiologist. On the left side the left internal carotid artery has approximately 50-60% stenosis. The external carotid artery is widely patent.  Next, the Berenstein catheter was pulled back over the guidewire and out. The 5 French sheath was thoroughly flushed with heparinized saline. The patient was taken to the holding area in stable condition.  The patient tolerated the procedure well and there were no neurologic complications. The patient was transported to the holding area in stable condition.   Operative findings: 75% recurrent stenosis right internal carotid artery. 60% stenosis left internal carotid artery. Type II arch. Patent vertebral arteries bilaterally  Disposition: The patient will be started on a combination of Plavix and aspirin today. She'll return in a few weeks for right carotid stenting.  Ruta Hinds, MD  [facility] [place]  Office: [phone]  Pager: [phone]

## 2013-04-10 ENCOUNTER — Other Ambulatory Visit: Payer: Self-pay

## 2013-04-10 ENCOUNTER — Encounter (HOSPITAL_COMMUNITY): Payer: Self-pay | Admitting: Pharmacy Technician

## 2013-04-22 ENCOUNTER — Encounter (HOSPITAL_COMMUNITY): Payer: Self-pay | Admitting: *Deleted

## 2013-04-22 ENCOUNTER — Encounter (HOSPITAL_COMMUNITY)
Admission: RE | Disposition: A | Discharge status: Home / Self Care | Payer: Medicare Other | Source: Ambulatory Visit | Attending: Vascular Surgery

## 2013-04-22 ENCOUNTER — Inpatient Hospital Stay (HOSPITAL_COMMUNITY)
Admission: RE | Admit: 2013-04-22 | Discharge: 2013-04-23 | DRG: 036 | Disposition: A | Discharge status: Home / Self Care | Payer: Medicare Other | Source: Ambulatory Visit | Attending: Vascular Surgery | Admitting: Vascular Surgery

## 2013-04-22 DIAGNOSIS — E7800 Pure hypercholesterolemia, unspecified: Secondary | ICD-10-CM | POA: Diagnosis present

## 2013-04-22 DIAGNOSIS — I6529 Occlusion and stenosis of unspecified carotid artery: Secondary | ICD-10-CM | POA: Diagnosis not present

## 2013-04-22 DIAGNOSIS — Z7982 Long term (current) use of aspirin: Secondary | ICD-10-CM | POA: Diagnosis not present

## 2013-04-22 DIAGNOSIS — Z7902 Long term (current) use of antithrombotics/antiplatelets: Secondary | ICD-10-CM

## 2013-04-22 DIAGNOSIS — I6521 Occlusion and stenosis of right carotid artery: Secondary | ICD-10-CM | POA: Diagnosis present

## 2013-04-22 DIAGNOSIS — Z951 Presence of aortocoronary bypass graft: Secondary | ICD-10-CM | POA: Diagnosis not present

## 2013-04-22 DIAGNOSIS — I2510 Atherosclerotic heart disease of native coronary artery without angina pectoris: Secondary | ICD-10-CM | POA: Diagnosis present

## 2013-04-22 HISTORY — PX: CAROTID STENT INSERTION: SHX5505

## 2013-04-22 LAB — POCT I-STAT, CHEM 8
BUN: 19 mg/dL (ref 6–23)
Calcium, Ion: 1.23 mmol/L (ref 1.13–1.30)
Chloride: 103 mEq/L (ref 96–112)
Creatinine, Ser: 0.9 mg/dL (ref 0.50–1.10)
Glucose, Bld: 100 mg/dL — ABNORMAL HIGH (ref 70–99)
HCT: 45 % (ref 36.0–46.0)
Hemoglobin: 15.3 g/dL — ABNORMAL HIGH (ref 12.0–15.0)
Potassium: 4.3 mEq/L (ref 3.7–5.3)
Sodium: 142 mEq/L (ref 137–147)
TCO2: 27 mmol/L (ref 0–100)

## 2013-04-22 LAB — POCT ACTIVATED CLOTTING TIME: Activated Clotting Time: 271 seconds

## 2013-04-22 LAB — MRSA PCR SCREENING: MRSA by PCR: NEGATIVE

## 2013-04-22 SURGERY — CAROTID STENT INSERTION
Anesthesia: LOCAL | Laterality: Right

## 2013-04-22 MED ORDER — NOREPINEPHRINE BITARTRATE 1 MG/ML IJ SOLN
INTRAMUSCULAR | Status: AC
  Filled 2013-04-22: qty 4

## 2013-04-22 MED ORDER — HEPARIN (PORCINE) IN NACL 2-0.9 UNIT/ML-% IJ SOLN
INTRAMUSCULAR | Status: AC
  Filled 2013-04-22: qty 1000

## 2013-04-22 MED ORDER — LOSARTAN POTASSIUM 50 MG PO TABS: 50.0000 mg | ORAL_TABLET | Freq: Every day | ORAL | Status: DC

## 2013-04-22 MED ORDER — ALUM & MAG HYDROXIDE-SIMETH 200-200-20 MG/5ML PO SUSP: 15.0000 mL | ORAL | Status: DC | PRN

## 2013-04-22 MED ORDER — ACETAMINOPHEN 325 MG PO TABS: 325.0000 mg | ORAL_TABLET | ORAL | Status: DC | PRN

## 2013-04-22 MED ORDER — ATROPINE SULFATE 0.1 MG/ML IJ SOLN
INTRAMUSCULAR | Status: AC
  Filled 2013-04-22: qty 10

## 2013-04-22 MED ORDER — LABETALOL HCL 5 MG/ML IV SOLN
10.0000 mg | INTRAVENOUS | Status: DC | PRN
Start: 2013-04-22 — End: 2013-04-23

## 2013-04-22 MED ORDER — EZETIMIBE 10 MG PO TABS
5.0000 mg | ORAL_TABLET | ORAL | Status: DC
  Administered 2013-04-22: 5 mg via ORAL
  Filled 2013-04-22 (×2): qty 0.5

## 2013-04-22 MED ORDER — SODIUM CHLORIDE 0.9 % IV SOLN: INTRAVENOUS | Status: DC

## 2013-04-22 MED ORDER — ASPIRIN 81 MG PO TABS: 81.0000 mg | ORAL_TABLET | Freq: Every day | ORAL | Status: DC

## 2013-04-22 MED ORDER — PHENOL 1.4 % MT LIQD: 1.0000 | OROMUCOSAL | Status: DC | PRN

## 2013-04-22 MED ORDER — METOPROLOL TARTRATE 1 MG/ML IV SOLN: 2.0000 mg | INTRAVENOUS | Status: DC | PRN

## 2013-04-22 MED ORDER — ACETAMINOPHEN 650 MG RE SUPP: 325.0000 mg | RECTAL | Status: DC | PRN

## 2013-04-22 MED ORDER — HYDRALAZINE HCL 20 MG/ML IJ SOLN: 10.0000 mg | INTRAMUSCULAR | Status: DC | PRN

## 2013-04-22 MED ORDER — LIDOCAINE HCL (PF) 1 % IJ SOLN
INTRAMUSCULAR | Status: AC
  Filled 2013-04-22: qty 30

## 2013-04-22 MED ORDER — ONDANSETRON HCL 4 MG/2ML IJ SOLN: 4.0000 mg | Freq: Four times a day (QID) | INTRAMUSCULAR | Status: DC | PRN

## 2013-04-22 MED ORDER — LOSARTAN POTASSIUM 50 MG PO TABS
50.0000 mg | ORAL_TABLET | Freq: Every day | ORAL | Status: DC
  Administered 2013-04-23: 50 mg via ORAL
  Filled 2013-04-22: qty 1

## 2013-04-22 MED ORDER — GUAIFENESIN-DM 100-10 MG/5ML PO SYRP: 15.0000 mL | ORAL_SOLUTION | ORAL | Status: DC | PRN

## 2013-04-22 MED ORDER — SODIUM CHLORIDE 0.9 % IV SOLN
INTRAVENOUS | Status: DC
  Administered 2013-04-22: 07:00:00 via INTRAVENOUS

## 2013-04-22 MED ORDER — BIVALIRUDIN 250 MG IV SOLR
INTRAVENOUS | Status: AC
  Filled 2013-04-22: qty 250

## 2013-04-22 MED ORDER — METOPROLOL TARTRATE 25 MG PO TABS
25.0000 mg | ORAL_TABLET | Freq: Two times a day (BID) | ORAL | Status: DC
  Administered 2013-04-22 – 2013-04-23 (×2): 25 mg via ORAL
  Filled 2013-04-22 (×3): qty 1

## 2013-04-22 MED ORDER — ASPIRIN 81 MG PO CHEW
81.0000 mg | CHEWABLE_TABLET | Freq: Every day | ORAL | Status: DC
  Administered 2013-04-23: 81 mg via ORAL
  Filled 2013-04-22: qty 1

## 2013-04-22 MED ORDER — CLOPIDOGREL BISULFATE 75 MG PO TABS
75.0000 mg | ORAL_TABLET | Freq: Every day | ORAL | Status: DC
  Administered 2013-04-23: 75 mg via ORAL
  Filled 2013-04-22: qty 1

## 2013-04-22 NOTE — Op Note (Addendum)
Procedure: Right carotid angiogram, right carotid angioplasty and stenting   Preoperative diagnosis: 75% right internal carotid artery stenosis  Postoperative diagnosis: Same   Anesthesia: Local  Co-surgeon: Annamarie Major M.D.   Operative details: After obtaining informed consent, the patient was taken to the [facility] lab. The patient was placed in supine position the Angio table. Both groins were prepped and draped in usual sterile fashion. Local anesthesia was infiltrated over the right common femoral artery. Ultrasound was used to identify the right common femoral artery.  An introducer needle was placed into the right common femoral artery using ultrasound and there was good backbleeding from this. A 0.035 Versacore wire was then threaded up the abdominal aorta under fluoroscopic guidance. A 6 French Cook shuttle sheath was then brought up on the operative field and advanced over the guidewire up into the ascending aorta. At this point an infusion of Angiomax was begun.A 5 French JB1 catheter was then advanced up into the aortic arch and a contrast angiogram was performed to determine the precise location of the right common carotid artery. Using the [place] catheter I then advanced the tip of the catheter into the right common carotid artery. The Versacore wire was removed and exchanged for an 035 Amplatz wire. The Amplatz wire was gently advanced up into the common carotid artery. The JB1 catheter was then kept in place and the sheath advanced over thisinto the right common carotid artery.  ACT was also checked and was 271. At this point a cordis Angio guard 7 mm filter was brought on the operative field and this was advanced up to the level of the carotid stenosis.  With some manipulation the guidewire advanced through the stenosis with minimal difficulty. The filter was then advanced into the distal internal carotid artery at the level of the skull base. The filter was then deployed in this location. At this  point a repeat contrast angiogram was performed to again determined the level of the stenosis. A 3 x 2 angioplasty balloon was brought in operative field and advanced to the level of stenosis and quickly inflated and deflated to 10 atmospheres. The predilatation balloon was removed. A 8 x 30 Abbott stent was then brought in the operative field and advanced to the level of stenosis. Again using angiographic guidance, the stent was deployed so that the tip of the stent was past level of the high-grade stenosis. The stent was then deployed. A postdilatation was then performed after the stent delivery device was removed. This was done with a 5 x 2 balloon inflated to 10 atmospheres for 5 seconds. A completion arteriogram was performed which showed that the residual stenosis was essentially 0 and the stent had good wall apposition. Completion intracranial views were also performed in AP and lateral projection to be interpreted later by the neuroradiologist. The patient tolerated procedure well and there were no complications. After the completion arteriogram had been performed, the sheath dilator was placed back in the sheath over a guidewire and these were then pulled down to the guidewire and the sheath exchanged for a 6 French short sheath in the right femoral artery. This was thoroughly flushed with heparinized saline and was left in place to be pulled in the holding area after the ACT is less than 175.  The patient tolerated the procedure well and there were no neurologic complications. The patient was transported to the holding area in stable condition.   Ruta Hinds, MD  [facility]  Office: [phone]  Pager: [phone]

## 2013-04-22 NOTE — Progress Notes (Signed)
Utilization review completed.  

## 2013-04-22 NOTE — H&P (Signed)
[facility]  Progress Note  03/19/2013  1:12 PM  @ORDAYSPST @  Subjective: Patient is a 80 year old female s/p right CEA (08/2012) who is here for follow-up after finding significant restenosis of the right carotid artery on cerebrovascular duplex on 03/10/2013. She is here to discuss possible arteriogram. She denies any temporary blindness and weakness in her extremities. She notes left arm numbness occasionally when she wakes up at night but this does not seem like a TIA symptom. She is on aspirin and plavix.  No current facility-administered medications on file prior to encounter.   Current Outpatient Prescriptions on File Prior to Encounter  Medication Sig Dispense Refill  . aspirin 81 MG tablet Take 81 mg by mouth daily.      . clopidogrel (PLAVIX) 75 MG tablet Take 1 tablet (75 mg total) by mouth daily.  30 tablet  11  . ezetimibe (ZETIA) 10 MG tablet Take 5 mg by mouth 3 (three) times a week. On Monday, Wednesday, and Friday.      . losartan (COZAAR) 100 MG tablet Take 0.5 tablets (50 mg total) by mouth daily.      . metoprolol tartrate (LOPRESSOR) 25 MG tablet Take 1 tablet (25 mg total) by mouth 2 (two) times daily.      . rosuvastatin (CRESTOR) 20 MG tablet Take 20 mg by mouth daily.       Past Medical History  Diagnosis Date  . Coronary artery disease   . Hypercholesteremia   . Chronic cough   . Arthritis   . Peripheral vascular disease   . Internal carotid artery stenosis 08/2012  . Normal cardiac stress test 08/2012    @ Dr Irven Shelling office   Past Surgical History  Procedure Laterality Date  . Abdominal hysterectomy    . Appendectomy    . Eye surgery      both cataracts  . Orif ankle fracture  06/14/2011    Procedure: OPEN REDUCTION INTERNAL FIXATION (ORIF) ANKLE FRACTURE;  Surgeon: Ninetta Lights, MD;  Location: [place];  Service: Orthopedics;  Laterality: Left;  left ankle fracture open treatment trimalleolar ankle includes internal  fixation WITHOUT fixation of posterior lip  . Coronary artery bypass graft    . Shoulder arthroscopy with subacromial decompression, rotator cuff repair and bicep tendon repair Left 06/05/2012    Procedure: LEFT SHOULDER ARTHROSCOPY WITH SUBACROMIAL DECOMPRESSION, PARTIAL ACROMIOPLASTY WITH CORACOACROMIAL RELEASE, DISTAL CLAVICULECTOMY, WITH ROTATOR CUFF REPAIR AND BICEP TENODESIS;  Surgeon: Ninetta Lights, MD;  Location: [place];  Service: Orthopedics;  Laterality: Left;  . Endarterectomy Right 08/25/2012    Procedure: ENDARTERECTOMY CAROTID;  Surgeon: Elam Dutch, MD;  Location: [place];  Service: Vascular;  Laterality: Right;  . Fracture surgery Left Jun 14, 2011    Ankle  . Carotid endarterectomy Right August 25, 2012    cea    Physical Exam:  Danley Danker Vitals:   04/22/13 0628  BP: 160/53  Pulse: 62  Temp: 98.1 F (36.7 C)  TempSrc: Oral  Resp: 18  Height: 5\' 2"  (1.575 m)  Weight: 168 lb (76.204 kg)  SpO2: 95%   Neck: carotid bruits bilaterally, harsher on right  Neuro: symmetric upper and lower extremity motor strength  Assessment: 80 y.o. female is s/p right CEA 08/2012.  -Restenosis of right carotid artery.  Plan:  Carotid stent today. Procedure risks benefits discussed with pt.   Ruta Hinds, MD  [facility] [place]  Office: [phone]  Pager: [phone]

## 2013-04-22 NOTE — Plan of Care (Signed)
Problem: Phase I Progression Outcomes Goal: Vascular site scale level 0 - I Vascular Site Scale Level 0: No bruising/bleeding/hematoma Level I (Mild): Bruising/Ecchymosis, minimal bleeding/ooozing, palpable hematoma < 3 cm Level II (Moderate): Bleeding not affecting hemodynamic parameters, pseudoaneurysm, palpable hematoma > 3 cm Level III (Severe) Bleeding which affects hemodynamic parameters or retroperitoneal hemorrhage  Outcome: Completed/Met Date Met:  04/22/13 Level 0

## 2013-04-23 ENCOUNTER — Telehealth: Payer: Self-pay | Admitting: Vascular Surgery

## 2013-04-23 LAB — CBC
HCT: 37.1 % (ref 36.0–46.0)
Hemoglobin: 12.4 g/dL (ref 12.0–15.0)
MCH: 32.8 pg (ref 26.0–34.0)
MCHC: 33.4 g/dL (ref 30.0–36.0)
MCV: 98.1 fL (ref 78.0–100.0)
Platelets: 210 10*3/uL (ref 150–400)
RBC: 3.78 MIL/uL — ABNORMAL LOW (ref 3.87–5.11)
RDW: 12.2 % (ref 11.5–15.5)
WBC: 6.2 10*3/uL (ref 4.0–10.5)

## 2013-04-23 LAB — BASIC METABOLIC PANEL
BUN: 11 mg/dL (ref 6–23)
CO2: 27 mEq/L (ref 19–32)
Calcium: 8.6 mg/dL (ref 8.4–10.5)
Chloride: 104 mEq/L (ref 96–112)
Creatinine, Ser: 0.7 mg/dL (ref 0.50–1.10)
GFR calc Af Amer: >90 mL/min (ref >90)
GFR calc non Af Amer: 81 mL/min — ABNORMAL LOW (ref >90)
Glucose, Bld: 98 mg/dL (ref 70–99)
Potassium: 4.2 mEq/L (ref 3.7–5.3)
Sodium: 141 mEq/L (ref 137–147)

## 2013-04-23 MED FILL — Sodium Chloride IV Soln 0.9%: INTRAVENOUS | Qty: 50 | Status: AC

## 2013-04-23 NOTE — Progress Notes (Addendum)
[facility] [place]  Subjective  - doing well no complaints of pain.   Objective 114/54 59 98.4 F (36.9 C) (Oral) 20 92%  Intake/Output Summary (Last 24 hours) at 04/23/13 0741 Last data filed at 04/22/13 2100  Gross per 24 hour  Intake 1680.42 ml  Output      0 ml  Net 1680.42 ml    No deviation of the tongue Radial pulses intact Grip 5/5 Right groin soft no hematoma   Assessment/Planning: POD #1 Right carotid angiogram, right carotid angioplasty and stenting  D/C home     [facility] 04/23/2013 7:41 AM --   Neuro intact No hematoma in groin Will d/c home Follow up 2 weeks for new baseline carotid duplex Plavix and aspirin  Ruta Hinds, MD [facility] [place]: [phone] Pager: [phone]  Laboratory Lab Results:  Recent Labs  04/22/13 0639 04/23/13 0433  WBC  --  6.2  HGB 15.3* 12.4  HCT 45.0 37.1  PLT  --  210   BMET  Recent Labs  04/22/13 0639 04/23/13 0433  NA 142 141  K 4.3 4.2  CL 103 104  CO2  --  27  GLUCOSE 100* 98  BUN 19 11  CREATININE 0.90 0.70  CALCIUM  --  8.6    COAG Lab Results  Component Value Date   INR 0.94 08/22/2012   No results found for this basename: PTT

## 2013-04-23 NOTE — Telephone Encounter (Addendum)
Message copied by Gena Fray on Thu Apr 23, 2013  3:37 PM ------      Message from: Peter Minium K      Created: Thu Apr 23, 2013  8:19 AM      Regarding: Schedule                   ----- Message -----         From: Ulyses Amor, PA-C         Sent: 04/23/2013   7:49 AM           To: Vvs Charge Pool            F/U with Dr. Oneida Alar s/p right carotid stent 2 weeks ------  04/23/13: spoke with patient, dpm

## 2013-04-23 NOTE — Progress Notes (Signed)
Discussed discharge instructions and medications with patient and son. Both verbalized understanding with all questions answered. VSS. Pt discharged home with son  [name]

## 2013-04-24 NOTE — Discharge Summary (Signed)
[facility] Discharge Summary   Patient ID:  [name] MRN: [id number] DOB/AGE: 09/09/1933 80 y.o.  Admit date: 04/22/2013 Discharge date: 04/24/2013 Date of Surgery: 04/22/2013 Surgeon: Surgeon(s): Elam Dutch, MD Serafina Mitchell, MD  Admission Diagnosis: Stenosis  Discharge Diagnoses:  Stenosis  Secondary Diagnoses: Past Medical History  Diagnosis Date  . Coronary artery disease   . Hypercholesteremia   . Chronic cough   . Arthritis   . Peripheral vascular disease   . Internal carotid artery stenosis 08/2012  . Normal cardiac stress test 08/2012    @ Dr Irven Shelling office    Procedure(s): CAROTID STENT INSERTION  Discharged Condition: good  HPI: 80 y/o female s/p right CEA (08/2012) who is here for follow-up after finding significant restenosis of the right carotid artery on cerebrovascular duplex on 03/10/2013. She is here to discuss possible arteriogram. She denies any temporary blindness and weakness in her extremities. She notes left arm numbness occasionally when she wakes up at night but this does not seem like a TIA symptom. She is on aspirin.  She underwent right carotid angiogram, right carotid angioplasty and stenting on 04/22/2013 By Dr. Valinda Hoar and Co-surgeon: Annamarie Major M.D.   Her hospital stay was uneventful.  She has a normal neurologic exam and was discharged home on Plavix and aspirin      Hospital Course:  [name] is a 80 y.o. female is S/P Right Procedure(s): CAROTID STENT INSERTION Extubated: POD # 0 Physical exam: No deviation of the tongue  Radial pulses intact  Grip 5/5  Right groin soft no hematoma  Post-op wounds healing well Pt. Ambulating, voiding and taking PO diet without difficulty. Pt pain controlled with PO pain meds. Labs as below Complications:none  Consults:     Significant Diagnostic Studies: CBC Lab Results  Component Value Date   WBC 6.2 04/23/2013   HGB 12.4 04/23/2013   HCT  37.1 04/23/2013   MCV 98.1 04/23/2013   PLT 210 04/23/2013    BMET    Component Value Date/Time   NA 141 04/23/2013 0433   K 4.2 04/23/2013 0433   CL 104 04/23/2013 0433   CO2 27 04/23/2013 0433   GLUCOSE 98 04/23/2013 0433   BUN 11 04/23/2013 0433   CREATININE 0.70 04/23/2013 0433   CALCIUM 8.6 04/23/2013 0433   GFRNONAA 81* 04/23/2013 0433   GFRAA >90 04/23/2013 0433   COAG Lab Results  Component Value Date   INR 0.94 08/22/2012     Disposition:  Discharge to :Home Discharge Orders   Future Appointments Provider Department Dept Phone   05/07/2013 8:45 AM Elam Dutch, MD [facility] [phone]   Future Orders Complete By Expires   Call MD for:  redness, tenderness, or signs of infection (pain, swelling, bleeding, redness, odor or green/yellow discharge around incision site)  As directed    Call MD for:  severe or increased pain, loss or decreased feeling  in affected limb(s)  As directed    Call MD for:  temperature >100.5  As directed    Discharge instructions  As directed    Comments:     Remove the right groin dressing in 24 hours and then you may shower.   Driving Restrictions  As directed    Comments:     No driving for 24 hours   Lifting restrictions  As directed    Comments:     No lifting for 6 weeks   Resume  previous diet  As directed        Medication List         aspirin 81 MG tablet  Take 81 mg by mouth daily.     clopidogrel 75 MG tablet  Commonly known as:  PLAVIX  Take 1 tablet (75 mg total) by mouth daily.     ezetimibe 10 MG tablet  Commonly known as:  ZETIA  Take 5 mg by mouth 3 (three) times a week. On Monday, Wednesday, and Friday.     losartan 100 MG tablet  Commonly known as:  COZAAR  Take 0.5 tablets (50 mg total) by mouth daily.     metoprolol tartrate 25 MG tablet  Commonly known as:  LOPRESSOR  Take 1 tablet (25 mg total) by mouth 2 (two) times daily.     rosuvastatin 20 MG tablet  Commonly known  as:  CRESTOR  Take 20 mg by mouth daily.       Verbal and written Discharge instructions given to the patient. Wound care per Discharge AVS     Follow-up Information   Follow up with Elam Dutch, MD In 2 weeks. (message sent to the office)    Specialty:  Vascular Surgery   Contact information:   [address] [place] [address] [phone]       Signed: [facility] 04/24/2013, 12:31 PM

## 2013-05-06 ENCOUNTER — Encounter: Payer: Self-pay | Admitting: Vascular Surgery

## 2013-05-07 ENCOUNTER — Ambulatory Visit (INDEPENDENT_AMBULATORY_CARE_PROVIDER_SITE_OTHER): Payer: Self-pay | Admitting: Vascular Surgery

## 2013-05-07 ENCOUNTER — Encounter: Payer: Self-pay | Admitting: Vascular Surgery

## 2013-05-07 VITALS — BP 146/59 | HR 58 | Temp 98.0°F | Ht 62.0 in | Wt 167.3 lb

## 2013-05-07 DIAGNOSIS — I6529 Occlusion and stenosis of unspecified carotid artery: Secondary | ICD-10-CM | POA: Diagnosis not present

## 2013-05-07 DIAGNOSIS — I2510 Atherosclerotic heart disease of native coronary artery without angina pectoris: Secondary | ICD-10-CM | POA: Diagnosis not present

## 2013-05-07 DIAGNOSIS — Z48812 Encounter for surgical aftercare following surgery on the circulatory system: Secondary | ICD-10-CM

## 2013-05-07 DIAGNOSIS — E782 Mixed hyperlipidemia: Secondary | ICD-10-CM | POA: Diagnosis not present

## 2013-05-07 NOTE — Addendum Note (Signed)
Addended by: Dorthula Rue L on: 05/07/2013 10:13 AM   Modules accepted: Orders

## 2013-05-07 NOTE — Progress Notes (Addendum)
Patient is a 80 year old female who is status post recent right carotid stenting for restenosis of previous carotid endarterectomy. She denies any symptoms of TIA amaurosis or stroke. She has no hematoma in her groin. She is on Plavix.  Review of systems: She denies shortness of breath. She denies chest pain.  Physical exam:  Filed Vitals:   05/07/13 0848 05/07/13 0850  BP: 140/65 146/59  Pulse: 58   Temp: 98 F (36.7 C)   TempSrc: Oral   Height: 5\' 2"  (1.575 m)   Weight: 167 lb 4.8 oz (75.887 kg)   SpO2: 95%     Neck: Soft right carotid bruit no left carotid bruit neuro: Symmetric upper extremity and lower extremity motor strength 5 over 5  Assessment: Doing well status post right carotid stenting.  Has known moderate left carotid stenosis as well currently asymptomatic.  Plan: Carotid duplex scan next week to establish new baseline, patient did not have enough time for the duplex today. Continue Plavix indefinitely. Followup carotid duplex scan office visit in 6 months.  Ruta Hinds, MD [facility] [place] Office: [phone] Pager: [phone]

## 2013-05-12 ENCOUNTER — Ambulatory Visit (HOSPITAL_COMMUNITY)
Admission: RE | Admit: 2013-05-12 | Discharge: 2013-05-12 | Disposition: A | Discharge status: Home / Self Care | Payer: Medicare Other | Source: Ambulatory Visit | Attending: Vascular Surgery | Admitting: Vascular Surgery

## 2013-05-12 DIAGNOSIS — I6529 Occlusion and stenosis of unspecified carotid artery: Secondary | ICD-10-CM | POA: Insufficient documentation

## 2013-05-12 DIAGNOSIS — Z48812 Encounter for surgical aftercare following surgery on the circulatory system: Secondary | ICD-10-CM | POA: Diagnosis not present

## 2013-07-27 DIAGNOSIS — M810 Age-related osteoporosis without current pathological fracture: Secondary | ICD-10-CM | POA: Diagnosis not present

## 2013-07-27 DIAGNOSIS — I10 Essential (primary) hypertension: Secondary | ICD-10-CM | POA: Diagnosis not present

## 2013-07-27 DIAGNOSIS — R7309 Other abnormal glucose: Secondary | ICD-10-CM | POA: Diagnosis not present

## 2013-07-30 DIAGNOSIS — R7309 Other abnormal glucose: Secondary | ICD-10-CM | POA: Diagnosis not present

## 2013-07-30 DIAGNOSIS — E7800 Pure hypercholesterolemia, unspecified: Secondary | ICD-10-CM | POA: Diagnosis not present

## 2013-07-30 DIAGNOSIS — I2510 Atherosclerotic heart disease of native coronary artery without angina pectoris: Secondary | ICD-10-CM | POA: Diagnosis not present

## 2013-07-30 DIAGNOSIS — I10 Essential (primary) hypertension: Secondary | ICD-10-CM | POA: Diagnosis not present

## 2013-08-25 DIAGNOSIS — M810 Age-related osteoporosis without current pathological fracture: Secondary | ICD-10-CM | POA: Diagnosis not present

## 2013-08-25 DIAGNOSIS — E559 Vitamin D deficiency, unspecified: Secondary | ICD-10-CM | POA: Diagnosis not present

## 2013-08-25 DIAGNOSIS — R5381 Other malaise: Secondary | ICD-10-CM | POA: Diagnosis not present

## 2013-08-25 DIAGNOSIS — R5383 Other fatigue: Secondary | ICD-10-CM | POA: Diagnosis not present

## 2013-09-10 DIAGNOSIS — E559 Vitamin D deficiency, unspecified: Secondary | ICD-10-CM | POA: Diagnosis not present

## 2013-09-10 DIAGNOSIS — M810 Age-related osteoporosis without current pathological fracture: Secondary | ICD-10-CM | POA: Diagnosis not present

## 2013-09-24 ENCOUNTER — Other Ambulatory Visit: Payer: Self-pay

## 2013-09-24 DIAGNOSIS — Z1231 Encounter for screening mammogram for malignant neoplasm of breast: Secondary | ICD-10-CM

## 2013-11-05 ENCOUNTER — Encounter: Payer: Self-pay | Admitting: Family

## 2013-11-05 ENCOUNTER — Ambulatory Visit
Admission: RE | Admit: 2013-11-05 | Discharge: 2013-11-05 | Disposition: A | Discharge status: Home / Self Care | Payer: Medicare Other | Source: Ambulatory Visit

## 2013-11-05 DIAGNOSIS — Z1231 Encounter for screening mammogram for malignant neoplasm of breast: Secondary | ICD-10-CM

## 2013-11-06 ENCOUNTER — Ambulatory Visit (INDEPENDENT_AMBULATORY_CARE_PROVIDER_SITE_OTHER): Payer: Medicare Other | Admitting: Family

## 2013-11-06 ENCOUNTER — Encounter: Payer: Self-pay | Admitting: Family

## 2013-11-06 ENCOUNTER — Ambulatory Visit (HOSPITAL_COMMUNITY)
Admission: RE | Admit: 2013-11-06 | Discharge: 2013-11-06 | Disposition: A | Discharge status: Home / Self Care | Payer: Medicare Other | Source: Ambulatory Visit | Attending: Family | Admitting: Family

## 2013-11-06 VITALS — BP 120/66 | HR 58 | Resp 14 | Ht 62.0 in | Wt 169.0 lb

## 2013-11-06 DIAGNOSIS — Z48812 Encounter for surgical aftercare following surgery on the circulatory system: Secondary | ICD-10-CM | POA: Insufficient documentation

## 2013-11-06 DIAGNOSIS — I6529 Occlusion and stenosis of unspecified carotid artery: Secondary | ICD-10-CM | POA: Diagnosis not present

## 2013-11-06 DIAGNOSIS — I6521 Occlusion and stenosis of right carotid artery: Secondary | ICD-10-CM

## 2013-11-06 NOTE — Progress Notes (Addendum)
Established Carotid Patient   History of Present Illness  [name] is a 80 y.o. female patient of Dr. Oneida Alar who is status post right carotid endarterectomy on August 25, 2012, and s/p Right carotid angiogram, right carotid angioplasty and stenting on 04/22/13 by Dr. Oneida Alar and Dr. Trula Slade.  She returns today for carotid artery surveillance.  Patient has Negative history of TIA or stroke symptom. The patient denies amaurosis fugax or monocular blindness. The patient denies facial drooping.  Pt. denies hemiplegia. The patient denies receptive or expressive aphasia.  She denies steal symptoms in either arm.  Pt denies claudication symptoms, denies non-healing wounds.  Pt denies New Medical or Surgical History.  Pt Diabetic: No  Pt smoker: non-smoker  Pt meds include:  Statin : Yes  Betablocker: Yes  ASA: Yes  Other anticoagulants/antiplatelets: Plavix started by Dr. Oneida Alar after right ICA stent placed   Past Medical History  Diagnosis Date  . Coronary artery disease   . Hypercholesteremia   . Chronic cough   . Arthritis   . Peripheral vascular disease   . Internal carotid artery stenosis 08/2012  . Normal cardiac stress test 08/2012    @ Dr Irven Shelling office    Social History History  Substance Use Topics  . Smoking status: Never Smoker   . Smokeless tobacco: Never Used  . Alcohol Use: No    Family History Family History  Problem Relation Age of Onset  . Cancer Father   . Diabetes Sister   . Heart disease Sister   . Hyperlipidemia Sister   . Heart disease Brother     Surgical History Past Surgical History  Procedure Laterality Date  . Abdominal hysterectomy    . Appendectomy    . Eye surgery      both cataracts  . Orif ankle fracture  06/14/2011    Procedure: OPEN REDUCTION INTERNAL FIXATION (ORIF) ANKLE FRACTURE;  Surgeon: Ninetta Lights, MD;  Location: [place];  Service: Orthopedics;  Laterality: Left;  left ankle fracture open treatment  trimalleolar ankle includes internal fixation WITHOUT fixation of posterior lip  . Coronary artery bypass graft    . Shoulder arthroscopy with subacromial decompression, rotator cuff repair and bicep tendon repair Left 06/05/2012    Procedure: LEFT SHOULDER ARTHROSCOPY WITH SUBACROMIAL DECOMPRESSION, PARTIAL ACROMIOPLASTY WITH CORACOACROMIAL RELEASE, DISTAL CLAVICULECTOMY, WITH ROTATOR CUFF REPAIR AND BICEP TENODESIS;  Surgeon: Ninetta Lights, MD;  Location: [place];  Service: Orthopedics;  Laterality: Left;  . Endarterectomy Right 08/25/2012    Procedure: ENDARTERECTOMY CAROTID;  Surgeon: Elam Dutch, MD;  Location: [place];  Service: Vascular;  Laterality: Right;  . Fracture surgery Left Jun 14, 2011    Ankle  . Carotid endarterectomy Right August 25, 2012    cea    Allergies  Allergen Reactions  . Hydrocodone Shortness Of Breath    Rash-itching-tongue swelled  . Fenofibrate Other (See Comments)    Caused every joint to become stif  . Oxycodone Swelling    Rash-itching  . Latex Rash and Other (See Comments)    "Blisters in mouth"    Current Outpatient Prescriptions  Medication Sig Dispense Refill  . aspirin 81 MG tablet Take 81 mg by mouth daily.      . citalopram (CELEXA) 20 MG tablet Take 20 mg by mouth daily.       . clopidogrel (PLAVIX) 75 MG tablet Take 1 tablet (75 mg total) by mouth daily.  30 tablet  11  .  ezetimibe (ZETIA) 10 MG tablet Take 5 mg by mouth daily.       Marland Kitchen losartan (COZAAR) 100 MG tablet Take 0.5 tablets (50 mg total) by mouth daily.      . metoprolol tartrate (LOPRESSOR) 25 MG tablet Take 1 tablet (25 mg total) by mouth 2 (two) times daily.      . rosuvastatin (CRESTOR) 20 MG tablet Take 20 mg by mouth daily.       No current facility-administered medications for this visit.    Review of Systems : See HPI for pertinent positives and negatives.  Physical Examination  Filed Vitals:   11/06/13 1106 11/06/13 1110  BP: 122/65 120/66   Pulse: 58 58  Resp:  14  Height:  5\' 2"  (1.575 m)  Weight:  169 lb (76.658 kg)  SpO2:  93%   Body mass index is 30.9 kg/(m^2).  General: WDWN female in NAD  GAIT: normal  Eyes: PERRLA  Pulmonary: CTAB, Negative Rales, Negative rhonchi, & Negative wheezing.  Cardiac: regular Rhythm , Negative detected Murmurs.   VASCULAR EXAM  Carotid Bruits  Left  Right    Soft bruit  Soft bruit   Radial pulses are 1+ right, 2+ left palpable.  LE Pulses  LEFT  RIGHT   POPLITEAL  not palpable  not palpable   POSTERIOR TIBIAL  not palpable  not palpable   DORSALIS PEDIS  ANTERIOR TIBIAL  palpable  palpable   Gastrointestinal: soft, nontender, BS WNL, no r/g, negative masses.  Musculoskeletal: Negative muscle atrophy/wasting. M/S 4/5 throughout, Extremities without ischemic changes.  Neurologic: A&O X 3; Appropriate Affect ; SENSATION ;normal;  Speech is normal  CN 2-12 intact , Pain and light touch intact in extremities, Motor exam as listed above.    Non-Invasive Vascular Imaging CAROTID DUPLEX 11/06/2013   CEREBROVASCULAR DUPLEX EVALUATION     INDICATION: Carotid artery disease; evaluation status post stent placement.    PREVIOUS INTERVENTION(S): Right carotid endarterectomy 08/25/2012 with stent placement 04/22/2013 due to restenosis.    DUPLEX EXAM:     RIGHT  LEFT  Peak Systolic Velocities (cm/s) End Diastolic Velocities (cm/s) Plaque LOCATION Peak Systolic Velocities (cm/s) End Diastolic Velocities (cm/s) Plaque  117 17  CCA PROXIMAL 113 24   104 22  CCA MID 87 18   200 21  CCA DISTAL 86 21   447 16  ECA 369 19 HT  91 18  ICA PROXIMAL 134 33 HT  84 25  ICA MID 146 27   132 28  ICA DISTAL 127 17      ICA / CCA Ratio (PSV) 1.67  Antegrade  Vertebral Flow Antegrade   409 Brachial Systolic Pressure (mmHg) 811  Triphasic  Brachial Artery Waveforms Triphasic     Peak Systolic Velocities (cm/s) End Diastolic Velocities (cm/s) Plaque STENT (Right ICA  ) Peak Systolic Velocities  (cm/s) End Diastolic Velocities (cm/s) Plaque  104 22  PROXIMAL     145 30  MID     305 22  DISTAL       Plaque Morphology:  HM = Homogeneous, HT = Heterogeneous, CP = Calcific Plaque, SP = Smooth Plaque, IP = Irregular Plaque      IMPRESSION: Patent right carotid stent with an elevated peak systolic velocity in the distal segment, minimal plaque observed. Velocities suggest a <40% stenosis based on velocity criteria. Left internal carotid artery velocities suggest a <40% stenosis.  Bilateral external carotid artery stenosis noted.    Compared to the previous  exam:  Stable left ICA. Elevated velocity of the left distal carotid stent compared to the previous exam on 05/12/2013.    Assessment: [name] is a 80 y.o. female who presents with asymptomatic patent right carotid stent with an elevated peak systolic velocity in the distal segment, minimal plaque observed. Velocities suggest a <40% stenosis based on velocity criteria. Left internal carotid artery velocities suggest a <40% stenosis.  Bilateral external carotid artery stenosis noted. Stable left ICA. Elevated velocity of the left distal carotid stent compared to the previous exam on 05/12/2013. After discussing with Dr. Oneida Alar, patient is to continue Plavix indefinitely, or until she develops bleeding issues.   Plan: Based on today's exam and Duplex, and after discussing with Dr. Bridgett Larsson, follow-up in 6 months with Carotid Duplex scan and Dr. Oneida Alar.   I discussed in depth with the patient the nature of atherosclerosis, and emphasized the importance of maximal medical management including strict control of blood pressure, blood glucose, and lipid levels, obtaining regular exercise, and continued cessation of smoking.  The patient is aware that without maximal medical management the underlying atherosclerotic disease process will progress, limiting the benefit of any interventions. The patient was given information about stroke  prevention and what symptoms should prompt the patient to seek immediate medical care. Thank you for allowing [place] to participate in this patient's care.  Clemon Chambers, RN, MSN, FNP-C [facility] [place] Office: [phone]  Clinic Physician: Bridgett Larsson  11/06/2013 11:02 AM

## 2013-11-06 NOTE — Patient Instructions (Signed)
Stroke Prevention Some medical conditions and behaviors are associated with an increased chance of having a stroke. You may prevent a stroke by making healthy choices and managing medical conditions. HOW CAN I REDUCE MY RISK OF HAVING A STROKE?   Stay physically active. Get at least 30 minutes of activity on most or all days.  Do not smoke. It may also be helpful to avoid exposure to secondhand smoke.  Limit alcohol use. Moderate alcohol use is considered to be:  No more than 2 drinks per day for men.  No more than 1 drink per day for nonpregnant women.  Eat healthy foods. This involves:  Eating 5 or more servings of fruits and vegetables a day.  Making dietary changes that address high blood pressure (hypertension), high cholesterol, diabetes, or obesity.  Manage your cholesterol levels.  Making food choices that are high in fiber and low in saturated fat, trans fat, and cholesterol may control cholesterol levels.  Take any prescribed medicines to control cholesterol as directed by your health care provider.  Manage your diabetes.  Controlling your carbohydrate and sugar intake is recommended to manage diabetes.  Take any prescribed medicines to control diabetes as directed by your health care provider.  Control your hypertension.  Making food choices that are low in salt (sodium), saturated fat, trans fat, and cholesterol is recommended to manage hypertension.  Take any prescribed medicines to control hypertension as directed by your health care provider.  Maintain a healthy weight.  Reducing calorie intake and making food choices that are low in sodium, saturated fat, trans fat, and cholesterol are recommended to manage weight.  Stop drug abuse.  Avoid taking birth control pills.  Talk to your health care provider about the risks of taking birth control pills if you are over 35 years old, smoke, get migraines, or have ever had a blood clot.  Get evaluated for sleep  disorders (sleep apnea).  Talk to your health care provider about getting a sleep evaluation if you snore a lot or have excessive sleepiness.  Take medicines only as directed by your health care provider.  For some people, aspirin or blood thinners (anticoagulants) are helpful in reducing the risk of forming abnormal blood clots that can lead to stroke. If you have the irregular heart rhythm of atrial fibrillation, you should be on a blood thinner unless there is a good reason you cannot take them.  Understand all your medicine instructions.  Make sure that other conditions (such as anemia or atherosclerosis) are addressed. SEEK IMMEDIATE MEDICAL CARE IF:   You have sudden weakness or numbness of the face, arm, or leg, especially on one side of the body.  Your face or eyelid droops to one side.  You have sudden confusion.  You have trouble speaking (aphasia) or understanding.  You have sudden trouble seeing in one or both eyes.  You have sudden trouble walking.  You have dizziness.  You have a loss of balance or coordination.  You have a sudden, severe headache with no known cause.  You have new chest pain or an irregular heartbeat. Any of these symptoms may represent a serious problem that is an emergency. Do not wait to see if the symptoms will go away. Get medical help at once. Call your local emergency services (911 in [place] Do not drive yourself to the hospital. Document Released: 03/01/2004 Document Revised: 06/08/2013 Document Reviewed: 07/25/2012 Exit[employer] Patient Information 2015 [employer]. This information is not intended to replace advice given   to you by your health care provider. Make sure you discuss any questions you have with your health care provider.  

## 2013-11-06 NOTE — Addendum Note (Signed)
Addended by: Dorthula Rue L on: 11/06/2013 02:32 PM   Modules accepted: Orders

## 2013-11-11 DIAGNOSIS — I6523 Occlusion and stenosis of bilateral carotid arteries: Secondary | ICD-10-CM | POA: Diagnosis not present

## 2013-11-11 DIAGNOSIS — I10 Essential (primary) hypertension: Secondary | ICD-10-CM | POA: Diagnosis not present

## 2013-11-11 DIAGNOSIS — I2510 Atherosclerotic heart disease of native coronary artery without angina pectoris: Secondary | ICD-10-CM | POA: Diagnosis not present

## 2013-11-11 DIAGNOSIS — E782 Mixed hyperlipidemia: Secondary | ICD-10-CM | POA: Diagnosis not present

## 2014-01-14 ENCOUNTER — Encounter (HOSPITAL_COMMUNITY): Payer: Self-pay | Admitting: Vascular Surgery

## 2014-01-21 ENCOUNTER — Telehealth: Payer: Self-pay | Admitting: *Deleted

## 2014-01-21 NOTE — Telephone Encounter (Signed)
[name] called in to ask if she could stop her Plavix because of her having multiple bruises on her skin. I spoke to Dr. Oneida Alar and report that she should stay on the Plavix indefinitely because of her carotid stenosis and carotid stent history. She also has had coronary stenting. She said she would continue with the Plavix and she understands that the benefits outweigh the bruising and other risks.

## 2014-02-02 DIAGNOSIS — I10 Essential (primary) hypertension: Secondary | ICD-10-CM | POA: Diagnosis not present

## 2014-02-02 DIAGNOSIS — M810 Age-related osteoporosis without current pathological fracture: Secondary | ICD-10-CM | POA: Diagnosis not present

## 2014-02-02 DIAGNOSIS — R739 Hyperglycemia, unspecified: Secondary | ICD-10-CM | POA: Diagnosis not present

## 2014-02-02 DIAGNOSIS — E780 Pure hypercholesterolemia: Secondary | ICD-10-CM | POA: Diagnosis not present

## 2014-02-04 DIAGNOSIS — E780 Pure hypercholesterolemia: Secondary | ICD-10-CM | POA: Diagnosis not present

## 2014-02-04 DIAGNOSIS — R739 Hyperglycemia, unspecified: Secondary | ICD-10-CM | POA: Diagnosis not present

## 2014-02-04 DIAGNOSIS — I10 Essential (primary) hypertension: Secondary | ICD-10-CM | POA: Diagnosis not present

## 2014-02-04 DIAGNOSIS — E559 Vitamin D deficiency, unspecified: Secondary | ICD-10-CM | POA: Diagnosis not present

## 2014-03-09 DIAGNOSIS — M810 Age-related osteoporosis without current pathological fracture: Secondary | ICD-10-CM | POA: Diagnosis not present

## 2014-03-09 DIAGNOSIS — R5383 Other fatigue: Secondary | ICD-10-CM | POA: Diagnosis not present

## 2014-03-09 DIAGNOSIS — E559 Vitamin D deficiency, unspecified: Secondary | ICD-10-CM | POA: Diagnosis not present

## 2014-03-17 DIAGNOSIS — M545 Low back pain: Secondary | ICD-10-CM | POA: Diagnosis not present

## 2014-03-17 DIAGNOSIS — M810 Age-related osteoporosis without current pathological fracture: Secondary | ICD-10-CM | POA: Diagnosis not present

## 2014-03-26 DIAGNOSIS — H40033 Anatomical narrow angle, bilateral: Secondary | ICD-10-CM | POA: Diagnosis not present

## 2014-03-26 DIAGNOSIS — H04123 Dry eye syndrome of bilateral lacrimal glands: Secondary | ICD-10-CM | POA: Diagnosis not present

## 2014-04-21 DIAGNOSIS — D1801 Hemangioma of skin and subcutaneous tissue: Secondary | ICD-10-CM | POA: Diagnosis not present

## 2014-04-21 DIAGNOSIS — L820 Inflamed seborrheic keratosis: Secondary | ICD-10-CM | POA: Diagnosis not present

## 2014-04-21 DIAGNOSIS — L821 Other seborrheic keratosis: Secondary | ICD-10-CM | POA: Diagnosis not present

## 2014-04-21 DIAGNOSIS — D225 Melanocytic nevi of trunk: Secondary | ICD-10-CM | POA: Diagnosis not present

## 2014-04-23 DIAGNOSIS — H43393 Other vitreous opacities, bilateral: Secondary | ICD-10-CM | POA: Diagnosis not present

## 2014-05-13 ENCOUNTER — Other Ambulatory Visit (HOSPITAL_COMMUNITY): Payer: Medicare Other

## 2014-05-13 ENCOUNTER — Ambulatory Visit: Payer: Medicare Other | Admitting: Vascular Surgery

## 2014-06-02 ENCOUNTER — Encounter: Payer: Self-pay | Admitting: Cardiology

## 2014-06-10 ENCOUNTER — Ambulatory Visit: Payer: Medicare Other | Admitting: Vascular Surgery

## 2014-06-10 ENCOUNTER — Other Ambulatory Visit (HOSPITAL_COMMUNITY): Payer: Medicare Other

## 2014-06-22 ENCOUNTER — Encounter: Payer: Self-pay | Admitting: Vascular Surgery

## 2014-06-24 ENCOUNTER — Ambulatory Visit (INDEPENDENT_AMBULATORY_CARE_PROVIDER_SITE_OTHER): Payer: Medicare Other | Admitting: Vascular Surgery

## 2014-06-24 ENCOUNTER — Encounter: Payer: Self-pay | Admitting: Vascular Surgery

## 2014-06-24 ENCOUNTER — Ambulatory Visit (HOSPITAL_COMMUNITY)
Admission: RE | Admit: 2014-06-24 | Discharge: 2014-06-24 | Disposition: A | Discharge status: Home / Self Care | Payer: Medicare Other | Source: Ambulatory Visit | Attending: Vascular Surgery | Admitting: Vascular Surgery

## 2014-06-24 VITALS — BP 154/68 | HR 79 | Ht 62.0 in | Wt 163.0 lb

## 2014-06-24 DIAGNOSIS — I6521 Occlusion and stenosis of right carotid artery: Secondary | ICD-10-CM

## 2014-06-24 DIAGNOSIS — Z48812 Encounter for surgical aftercare following surgery on the circulatory system: Secondary | ICD-10-CM | POA: Insufficient documentation

## 2014-06-24 DIAGNOSIS — I6523 Occlusion and stenosis of bilateral carotid arteries: Secondary | ICD-10-CM | POA: Insufficient documentation

## 2014-06-24 NOTE — Progress Notes (Signed)
Patient is a 81 year old female who is status post right carotid stenting March 2015 for restenosis of previous carotid endarterectomy that was done in July 2014. She denies any symptoms of TIA amaurosis or stroke.  She is on Plavix and aspirin.  Review of systems: She denies shortness of breath. She denies chest pain.  Physical exam:    Filed Vitals:   06/24/14 1508 06/24/14 1510  BP: 124/66 154/68  Pulse: 71 79  Height: 5\' 2"  (1.575 m)   Weight: 163 lb (73.936 kg)   SpO2: 90%     Neck: Soft right carotid bruit no left carotid bruit neuro: Symmetric upper extremity and lower extremity motor strength 5 over 5  Assessment: Doing well status post right carotid stenting.  Has known moderate left carotid stenosis as well currently asymptomatic.  Data: Patient had a carotid duplex scan today which I reviewed and interpreted. This showed less than 50% left internal carotid artery stenosis no residual stenosis right side  Plan: Follow-up carotid duplex scan in 1 year. Continue Plavix and aspirin indefinitely.   Ruta Hinds, MD [facility] Office: [phone] Pager: [phone]

## 2014-08-02 DIAGNOSIS — I10 Essential (primary) hypertension: Secondary | ICD-10-CM | POA: Diagnosis not present

## 2014-08-02 DIAGNOSIS — E559 Vitamin D deficiency, unspecified: Secondary | ICD-10-CM | POA: Diagnosis not present

## 2014-08-12 ENCOUNTER — Other Ambulatory Visit: Payer: Self-pay | Admitting: Sports Medicine

## 2014-08-12 DIAGNOSIS — E559 Vitamin D deficiency, unspecified: Secondary | ICD-10-CM

## 2014-08-16 DIAGNOSIS — I2510 Atherosclerotic heart disease of native coronary artery without angina pectoris: Secondary | ICD-10-CM | POA: Diagnosis not present

## 2014-08-16 DIAGNOSIS — M810 Age-related osteoporosis without current pathological fracture: Secondary | ICD-10-CM | POA: Diagnosis not present

## 2014-08-16 DIAGNOSIS — R739 Hyperglycemia, unspecified: Secondary | ICD-10-CM | POA: Diagnosis not present

## 2014-08-16 DIAGNOSIS — I10 Essential (primary) hypertension: Secondary | ICD-10-CM | POA: Diagnosis not present

## 2014-08-26 ENCOUNTER — Encounter: Payer: Self-pay | Admitting: Internal Medicine

## 2014-09-06 ENCOUNTER — Other Ambulatory Visit: Payer: Self-pay | Admitting: Sports Medicine

## 2014-09-06 DIAGNOSIS — E2839 Other primary ovarian failure: Secondary | ICD-10-CM

## 2014-09-06 DIAGNOSIS — E559 Vitamin D deficiency, unspecified: Secondary | ICD-10-CM

## 2014-09-07 ENCOUNTER — Ambulatory Visit
Admission: RE | Admit: 2014-09-07 | Discharge: 2014-09-07 | Disposition: A | Discharge status: Home / Self Care | Payer: Medicare Other | Source: Ambulatory Visit | Attending: Sports Medicine | Admitting: Sports Medicine

## 2014-09-07 DIAGNOSIS — M810 Age-related osteoporosis without current pathological fracture: Secondary | ICD-10-CM | POA: Diagnosis not present

## 2014-09-07 DIAGNOSIS — M8588 Other specified disorders of bone density and structure, other site: Secondary | ICD-10-CM | POA: Diagnosis not present

## 2014-09-07 DIAGNOSIS — E559 Vitamin D deficiency, unspecified: Secondary | ICD-10-CM | POA: Diagnosis not present

## 2014-09-07 DIAGNOSIS — R5383 Other fatigue: Secondary | ICD-10-CM | POA: Diagnosis not present

## 2014-09-07 DIAGNOSIS — M85852 Other specified disorders of bone density and structure, left thigh: Secondary | ICD-10-CM | POA: Diagnosis not present

## 2014-09-07 DIAGNOSIS — E2839 Other primary ovarian failure: Secondary | ICD-10-CM

## 2014-09-07 DIAGNOSIS — Z780 Asymptomatic menopausal state: Secondary | ICD-10-CM | POA: Diagnosis not present

## 2014-09-13 ENCOUNTER — Other Ambulatory Visit: Payer: Self-pay | Admitting: Dermatology

## 2014-09-13 DIAGNOSIS — B078 Other viral warts: Secondary | ICD-10-CM | POA: Diagnosis not present

## 2014-09-13 DIAGNOSIS — D485 Neoplasm of uncertain behavior of skin: Secondary | ICD-10-CM | POA: Diagnosis not present

## 2014-09-15 DIAGNOSIS — M810 Age-related osteoporosis without current pathological fracture: Secondary | ICD-10-CM | POA: Diagnosis not present

## 2014-09-15 DIAGNOSIS — E559 Vitamin D deficiency, unspecified: Secondary | ICD-10-CM | POA: Diagnosis not present

## 2014-09-29 DIAGNOSIS — M25562 Pain in left knee: Secondary | ICD-10-CM | POA: Diagnosis not present

## 2014-09-29 DIAGNOSIS — M25561 Pain in right knee: Secondary | ICD-10-CM | POA: Diagnosis not present

## 2014-09-29 DIAGNOSIS — R262 Difficulty in walking, not elsewhere classified: Secondary | ICD-10-CM | POA: Diagnosis not present

## 2014-09-29 DIAGNOSIS — M170 Bilateral primary osteoarthritis of knee: Secondary | ICD-10-CM | POA: Diagnosis not present

## 2014-09-30 DIAGNOSIS — H3531 Nonexudative age-related macular degeneration: Secondary | ICD-10-CM | POA: Diagnosis not present

## 2014-10-04 ENCOUNTER — Other Ambulatory Visit: Payer: Self-pay

## 2014-10-04 DIAGNOSIS — Z1231 Encounter for screening mammogram for malignant neoplasm of breast: Secondary | ICD-10-CM

## 2014-10-05 DIAGNOSIS — M25562 Pain in left knee: Secondary | ICD-10-CM | POA: Diagnosis not present

## 2014-10-05 DIAGNOSIS — M1712 Unilateral primary osteoarthritis, left knee: Secondary | ICD-10-CM | POA: Diagnosis not present

## 2014-10-07 DIAGNOSIS — M25561 Pain in right knee: Secondary | ICD-10-CM | POA: Diagnosis not present

## 2014-10-07 DIAGNOSIS — M1711 Unilateral primary osteoarthritis, right knee: Secondary | ICD-10-CM | POA: Diagnosis not present

## 2014-10-13 DIAGNOSIS — M1712 Unilateral primary osteoarthritis, left knee: Secondary | ICD-10-CM | POA: Diagnosis not present

## 2014-10-13 DIAGNOSIS — M25561 Pain in right knee: Secondary | ICD-10-CM | POA: Diagnosis not present

## 2014-10-13 DIAGNOSIS — M25562 Pain in left knee: Secondary | ICD-10-CM | POA: Diagnosis not present

## 2014-10-13 DIAGNOSIS — M170 Bilateral primary osteoarthritis of knee: Secondary | ICD-10-CM | POA: Diagnosis not present

## 2014-10-13 DIAGNOSIS — R2689 Other abnormalities of gait and mobility: Secondary | ICD-10-CM | POA: Diagnosis not present

## 2014-10-14 DIAGNOSIS — M1711 Unilateral primary osteoarthritis, right knee: Secondary | ICD-10-CM | POA: Diagnosis not present

## 2014-10-14 DIAGNOSIS — M25562 Pain in left knee: Secondary | ICD-10-CM | POA: Diagnosis not present

## 2014-10-14 DIAGNOSIS — M25561 Pain in right knee: Secondary | ICD-10-CM | POA: Diagnosis not present

## 2014-10-14 DIAGNOSIS — R2689 Other abnormalities of gait and mobility: Secondary | ICD-10-CM | POA: Diagnosis not present

## 2014-10-14 DIAGNOSIS — M170 Bilateral primary osteoarthritis of knee: Secondary | ICD-10-CM | POA: Diagnosis not present

## 2014-10-19 DIAGNOSIS — R2689 Other abnormalities of gait and mobility: Secondary | ICD-10-CM | POA: Diagnosis not present

## 2014-10-19 DIAGNOSIS — M25562 Pain in left knee: Secondary | ICD-10-CM | POA: Diagnosis not present

## 2014-10-19 DIAGNOSIS — M170 Bilateral primary osteoarthritis of knee: Secondary | ICD-10-CM | POA: Diagnosis not present

## 2014-10-19 DIAGNOSIS — M25561 Pain in right knee: Secondary | ICD-10-CM | POA: Diagnosis not present

## 2014-10-19 DIAGNOSIS — M1712 Unilateral primary osteoarthritis, left knee: Secondary | ICD-10-CM | POA: Diagnosis not present

## 2014-10-21 DIAGNOSIS — M1711 Unilateral primary osteoarthritis, right knee: Secondary | ICD-10-CM | POA: Diagnosis not present

## 2014-10-21 DIAGNOSIS — M25561 Pain in right knee: Secondary | ICD-10-CM | POA: Diagnosis not present

## 2014-10-21 DIAGNOSIS — M25562 Pain in left knee: Secondary | ICD-10-CM | POA: Diagnosis not present

## 2014-10-21 DIAGNOSIS — M170 Bilateral primary osteoarthritis of knee: Secondary | ICD-10-CM | POA: Diagnosis not present

## 2014-10-21 DIAGNOSIS — R2689 Other abnormalities of gait and mobility: Secondary | ICD-10-CM | POA: Diagnosis not present

## 2014-10-29 DIAGNOSIS — M25562 Pain in left knee: Secondary | ICD-10-CM | POA: Diagnosis not present

## 2014-10-29 DIAGNOSIS — M170 Bilateral primary osteoarthritis of knee: Secondary | ICD-10-CM | POA: Diagnosis not present

## 2014-10-29 DIAGNOSIS — M25561 Pain in right knee: Secondary | ICD-10-CM | POA: Diagnosis not present

## 2014-11-02 DIAGNOSIS — M25561 Pain in right knee: Secondary | ICD-10-CM | POA: Diagnosis not present

## 2014-11-02 DIAGNOSIS — M25562 Pain in left knee: Secondary | ICD-10-CM | POA: Diagnosis not present

## 2014-11-02 DIAGNOSIS — M1711 Unilateral primary osteoarthritis, right knee: Secondary | ICD-10-CM | POA: Diagnosis not present

## 2014-11-02 DIAGNOSIS — M170 Bilateral primary osteoarthritis of knee: Secondary | ICD-10-CM | POA: Diagnosis not present

## 2014-11-02 DIAGNOSIS — R2689 Other abnormalities of gait and mobility: Secondary | ICD-10-CM | POA: Diagnosis not present

## 2014-11-08 ENCOUNTER — Ambulatory Visit
Admission: RE | Admit: 2014-11-08 | Discharge: 2014-11-08 | Disposition: A | Discharge status: Home / Self Care | Payer: Medicare Other | Source: Ambulatory Visit

## 2014-11-08 DIAGNOSIS — Z1231 Encounter for screening mammogram for malignant neoplasm of breast: Secondary | ICD-10-CM | POA: Diagnosis not present

## 2014-11-12 DIAGNOSIS — I10 Essential (primary) hypertension: Secondary | ICD-10-CM | POA: Diagnosis not present

## 2014-11-12 DIAGNOSIS — E782 Mixed hyperlipidemia: Secondary | ICD-10-CM | POA: Diagnosis not present

## 2014-11-12 DIAGNOSIS — I6523 Occlusion and stenosis of bilateral carotid arteries: Secondary | ICD-10-CM | POA: Diagnosis not present

## 2014-11-12 DIAGNOSIS — I2510 Atherosclerotic heart disease of native coronary artery without angina pectoris: Secondary | ICD-10-CM | POA: Diagnosis not present

## 2015-02-10 DIAGNOSIS — E7800 Pure hypercholesterolemia, unspecified: Secondary | ICD-10-CM | POA: Diagnosis not present

## 2015-02-10 DIAGNOSIS — R739 Hyperglycemia, unspecified: Secondary | ICD-10-CM | POA: Diagnosis not present

## 2015-02-10 DIAGNOSIS — M810 Age-related osteoporosis without current pathological fracture: Secondary | ICD-10-CM | POA: Diagnosis not present

## 2015-02-10 DIAGNOSIS — I10 Essential (primary) hypertension: Secondary | ICD-10-CM | POA: Diagnosis not present

## 2015-02-16 DIAGNOSIS — E7800 Pure hypercholesterolemia, unspecified: Secondary | ICD-10-CM | POA: Diagnosis not present

## 2015-02-16 DIAGNOSIS — I10 Essential (primary) hypertension: Secondary | ICD-10-CM | POA: Diagnosis not present

## 2015-02-16 DIAGNOSIS — I2510 Atherosclerotic heart disease of native coronary artery without angina pectoris: Secondary | ICD-10-CM | POA: Diagnosis not present

## 2015-02-16 DIAGNOSIS — E559 Vitamin D deficiency, unspecified: Secondary | ICD-10-CM | POA: Diagnosis not present

## 2015-02-22 DIAGNOSIS — M170 Bilateral primary osteoarthritis of knee: Secondary | ICD-10-CM | POA: Diagnosis not present

## 2015-02-22 DIAGNOSIS — R262 Difficulty in walking, not elsewhere classified: Secondary | ICD-10-CM | POA: Diagnosis not present

## 2015-02-22 DIAGNOSIS — M25561 Pain in right knee: Secondary | ICD-10-CM | POA: Diagnosis not present

## 2015-02-22 DIAGNOSIS — M1711 Unilateral primary osteoarthritis, right knee: Secondary | ICD-10-CM | POA: Diagnosis not present

## 2015-03-01 DIAGNOSIS — M1711 Unilateral primary osteoarthritis, right knee: Secondary | ICD-10-CM | POA: Diagnosis not present

## 2015-03-01 DIAGNOSIS — M25561 Pain in right knee: Secondary | ICD-10-CM | POA: Diagnosis not present

## 2015-03-02 IMAGING — CR DG CHEST 2V
2 series · 2 of 2 positions shown · non-contrast
Comparison: 08/16/2005

CLINICAL DATA: Preoperative evaluation for carotid endarterectomy,
history blood pressure medication, coronary artery disease

CHEST - 2 VIEW

[w chest pa]
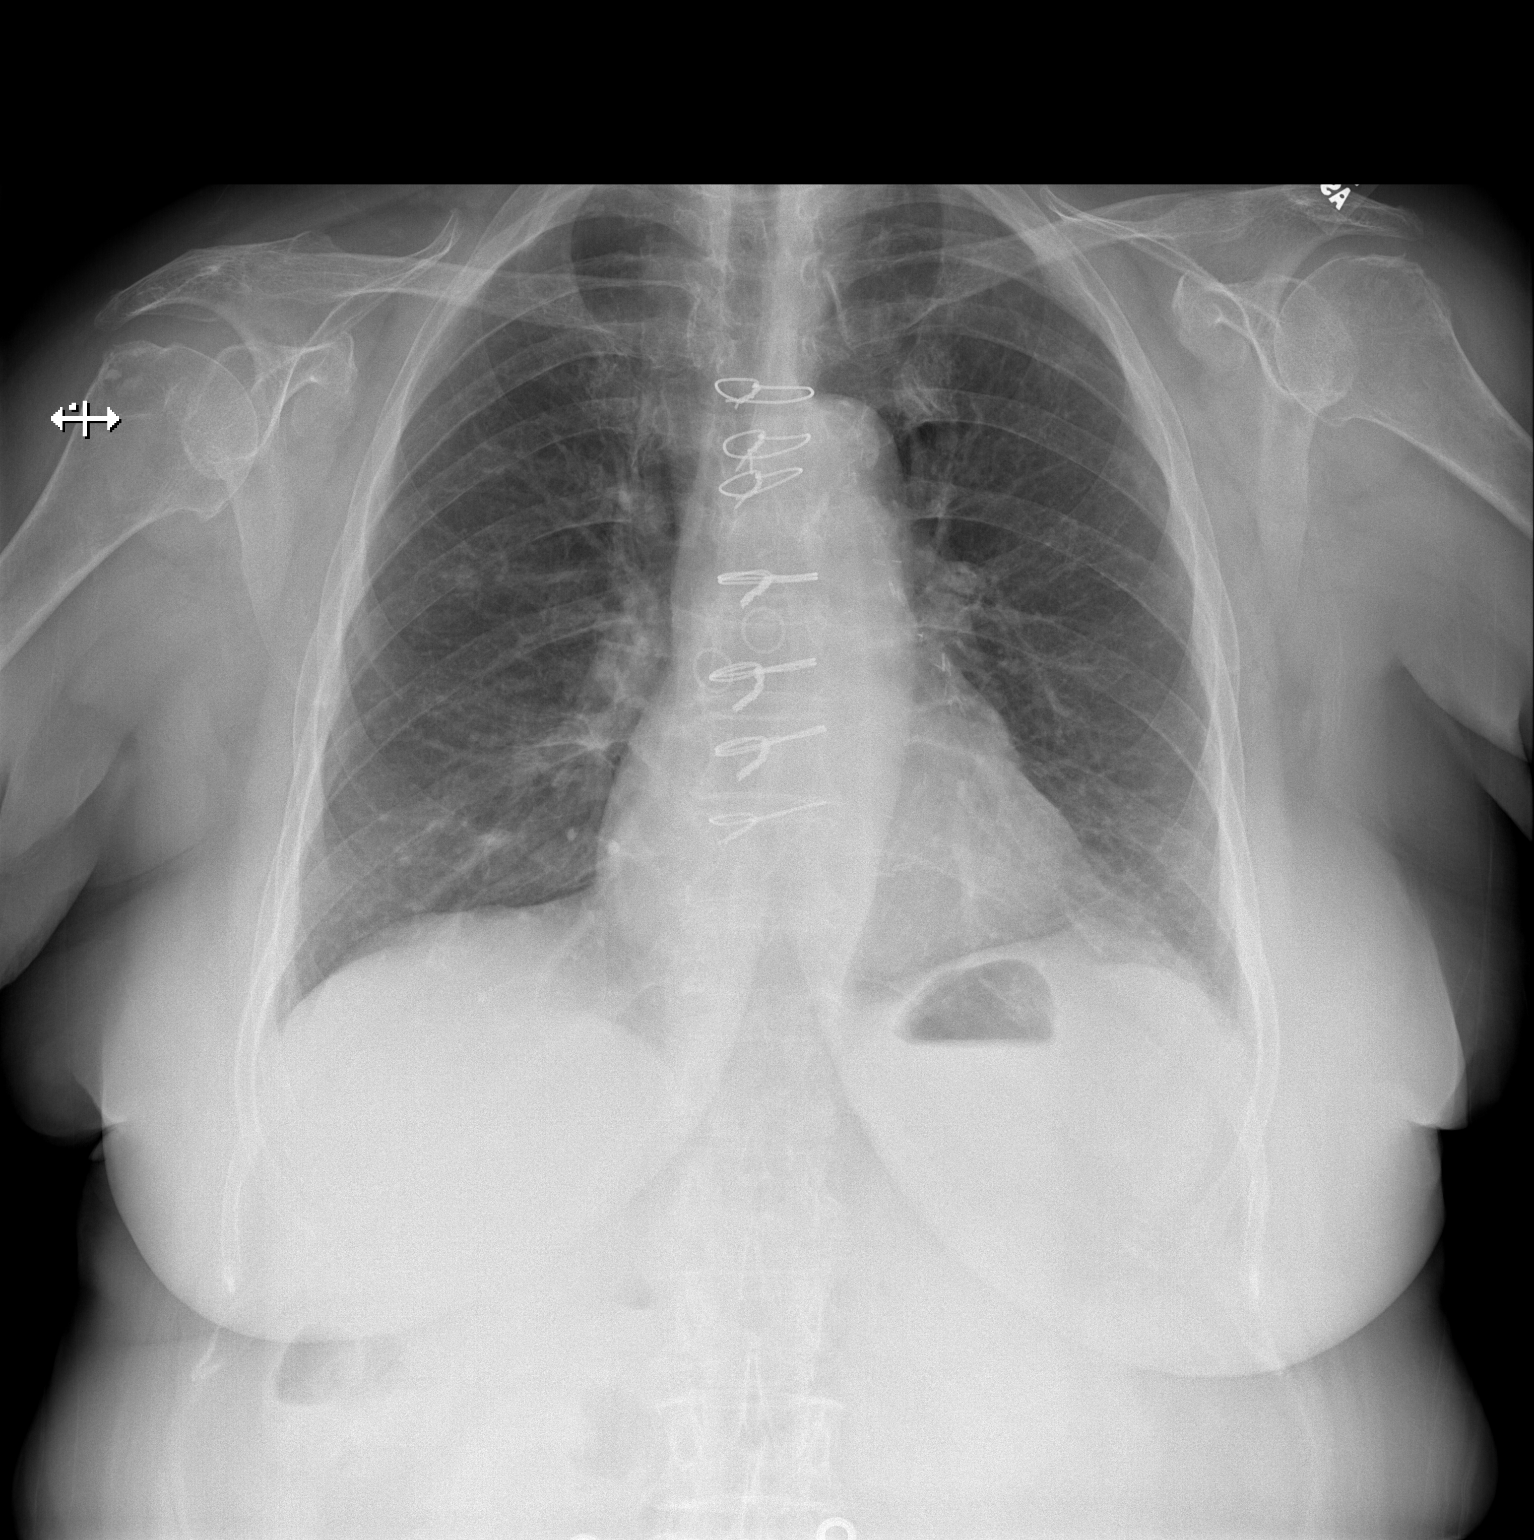

[w chest lat]
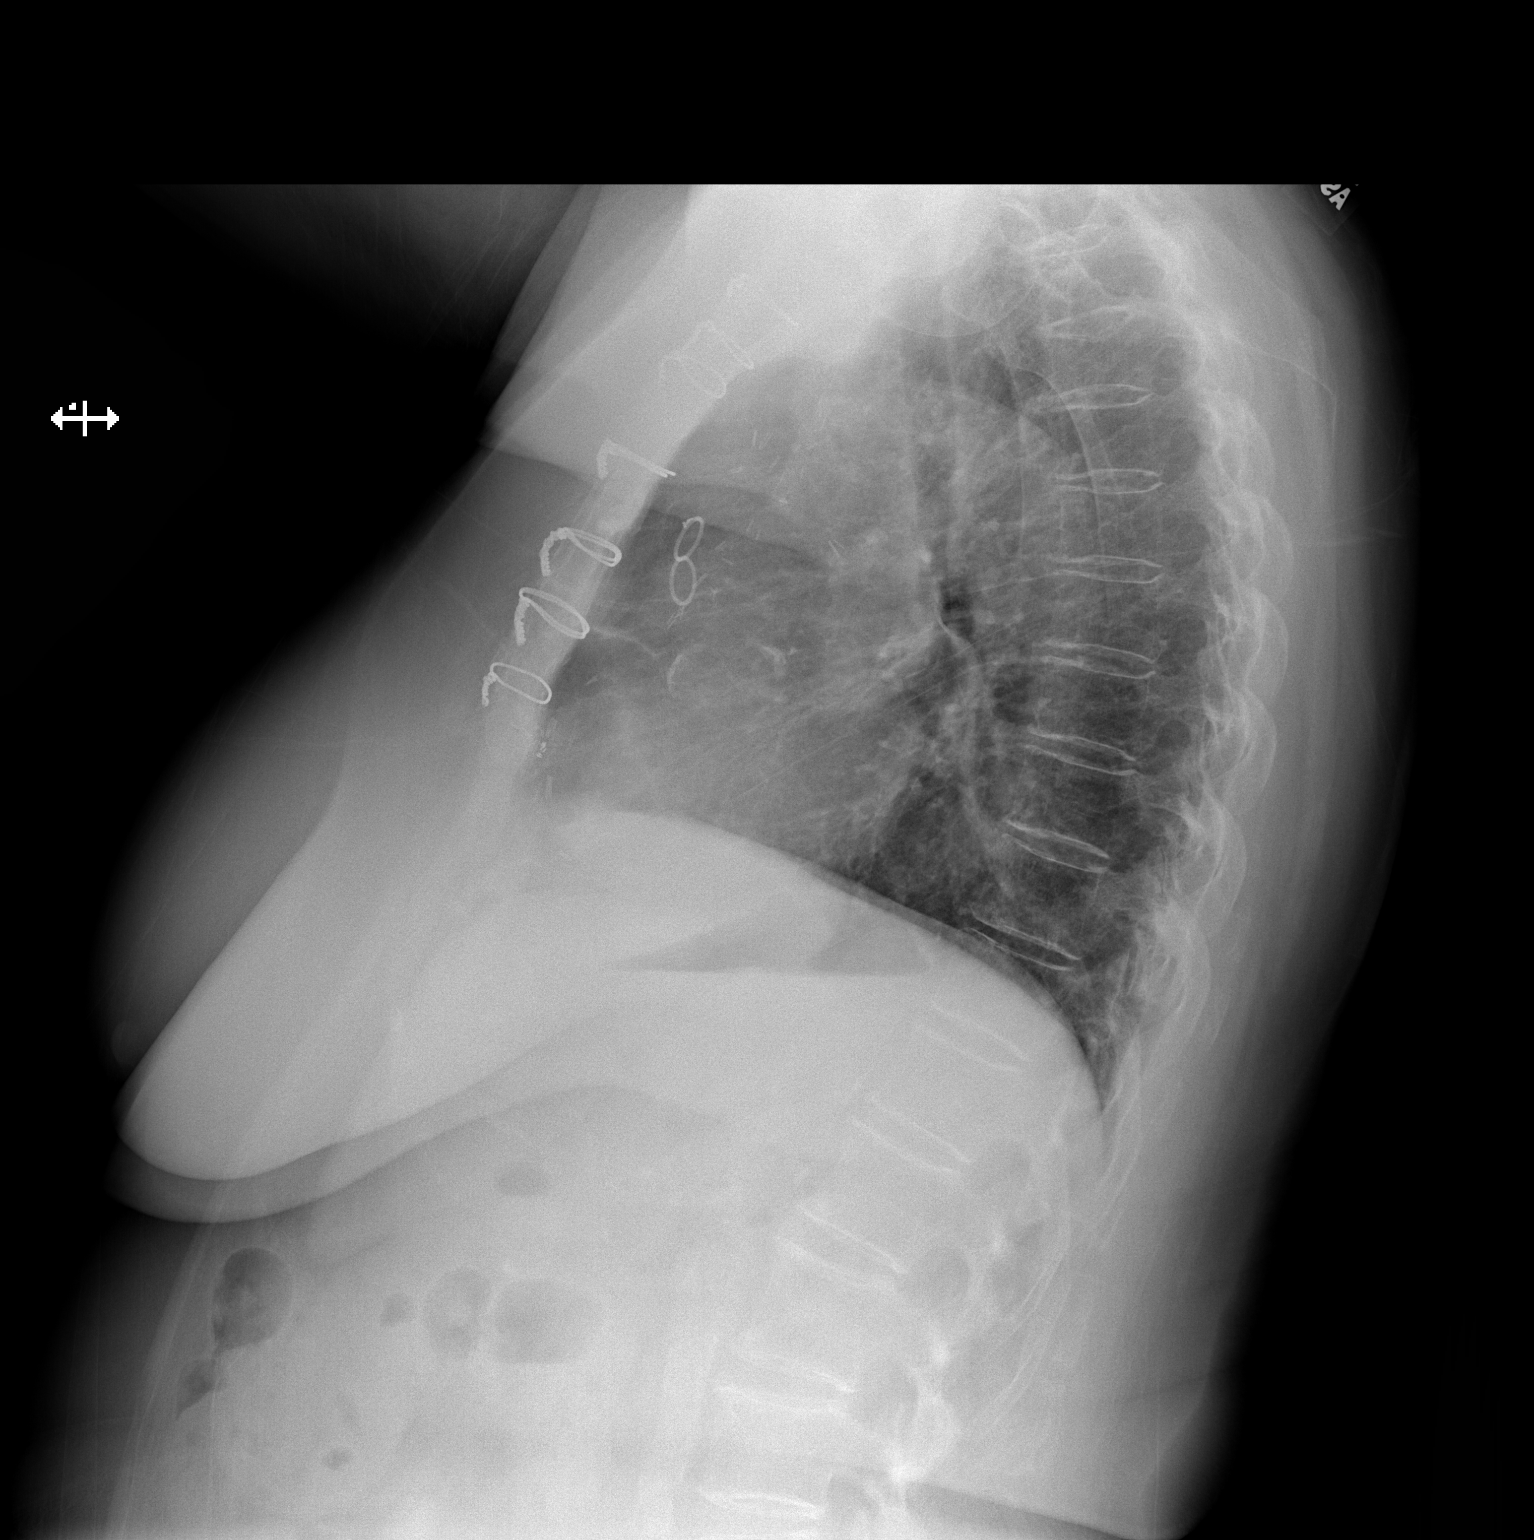

[2 of 2 positions shown; findings below may reference images not displayed]

FINDINGS: Upper normal heart size post CABG.
Calcified tortuous aorta.
Pulmonary vascularity normal.
Lungs clear.
No pleural effusion or pneumothorax.
Bones diffusely demineralized with chronic can cavity of the
superior endplate of an upper thoracic vertebra.
IMPRESSION: Post CABG.
No acute abnormalities.

## 2015-03-08 DIAGNOSIS — M1711 Unilateral primary osteoarthritis, right knee: Secondary | ICD-10-CM | POA: Diagnosis not present

## 2015-03-08 DIAGNOSIS — M25561 Pain in right knee: Secondary | ICD-10-CM | POA: Diagnosis not present

## 2015-03-14 DIAGNOSIS — M810 Age-related osteoporosis without current pathological fracture: Secondary | ICD-10-CM | POA: Diagnosis not present

## 2015-03-14 DIAGNOSIS — E559 Vitamin D deficiency, unspecified: Secondary | ICD-10-CM | POA: Diagnosis not present

## 2015-03-14 DIAGNOSIS — R5383 Other fatigue: Secondary | ICD-10-CM | POA: Diagnosis not present

## 2015-03-15 DIAGNOSIS — M25561 Pain in right knee: Secondary | ICD-10-CM | POA: Diagnosis not present

## 2015-03-15 DIAGNOSIS — M1711 Unilateral primary osteoarthritis, right knee: Secondary | ICD-10-CM | POA: Diagnosis not present

## 2015-03-22 DIAGNOSIS — M25561 Pain in right knee: Secondary | ICD-10-CM | POA: Diagnosis not present

## 2015-03-22 DIAGNOSIS — M1711 Unilateral primary osteoarthritis, right knee: Secondary | ICD-10-CM | POA: Diagnosis not present

## 2015-03-25 DIAGNOSIS — M810 Age-related osteoporosis without current pathological fracture: Secondary | ICD-10-CM | POA: Diagnosis not present

## 2015-03-25 DIAGNOSIS — E559 Vitamin D deficiency, unspecified: Secondary | ICD-10-CM | POA: Diagnosis not present

## 2015-04-26 ENCOUNTER — Encounter: Payer: Self-pay | Admitting: Cardiology

## 2015-06-03 ENCOUNTER — Encounter: Payer: Self-pay | Admitting: Vascular Surgery

## 2015-06-09 ENCOUNTER — Encounter: Payer: Self-pay | Admitting: Vascular Surgery

## 2015-06-09 ENCOUNTER — Other Ambulatory Visit: Payer: Self-pay | Admitting: Vascular Surgery

## 2015-06-09 ENCOUNTER — Ambulatory Visit (HOSPITAL_COMMUNITY)
Admission: RE | Admit: 2015-06-09 | Discharge: 2015-06-09 | Disposition: A | Discharge status: Home / Self Care | Payer: Medicare Other | Source: Ambulatory Visit | Attending: Vascular Surgery | Admitting: Vascular Surgery

## 2015-06-09 ENCOUNTER — Ambulatory Visit (INDEPENDENT_AMBULATORY_CARE_PROVIDER_SITE_OTHER): Payer: Medicare Other | Admitting: Vascular Surgery

## 2015-06-09 VITALS — BP 147/76 | HR 55 | Temp 98.8°F | Resp 18 | Ht 62.5 in | Wt 169.2 lb

## 2015-06-09 DIAGNOSIS — I6523 Occlusion and stenosis of bilateral carotid arteries: Secondary | ICD-10-CM

## 2015-06-09 DIAGNOSIS — Z48812 Encounter for surgical aftercare following surgery on the circulatory system: Secondary | ICD-10-CM | POA: Diagnosis not present

## 2015-06-09 DIAGNOSIS — E7800 Pure hypercholesterolemia, unspecified: Secondary | ICD-10-CM | POA: Insufficient documentation

## 2015-06-09 DIAGNOSIS — I2510 Atherosclerotic heart disease of native coronary artery without angina pectoris: Secondary | ICD-10-CM | POA: Diagnosis not present

## 2015-06-09 NOTE — Progress Notes (Signed)
Patient is a 82 year old female who is status post right carotid stenting March 2015 for restenosis of previous carotid endarterectomy that was done in July 2014. She denies any symptoms of TIA amaurosis or stroke.  She is on Plavix and aspirin.  Review of systems: She denies shortness of breath. She denies chest pain.  Physical exam:  Filed Vitals:   06/09/15 1237 06/09/15 1243  BP: 143/71 147/76  Pulse: 55   Temp: 98.8 F (37.1 C)   TempSrc: Oral   Resp: 18   Height: 5' 2.5" (1.588 m)   Weight: 169 lb 3.2 oz (76.749 kg)   SpO2: 94%     Neck: Soft right carotid bruit no left carotid bruit  neuro: Symmetric upper extremity and lower extremity motor strength 5 over 5  Data: Patient had a carotid duplex scan today which I reviewed and interpreted. This showed less than 50% left internal carotid artery stenosis no residual stenosis right side  Assessment: Doing well status post right carotid stenting.  Has known moderate left carotid stenosis as well currently asymptomatic.  Plan: Follow-up carotid duplex scan in 1 year. Continue Plavix and aspirin indefinitely.   Ruta Hinds, MD [facility] Office: [phone] Pager: [phone]

## 2015-06-09 NOTE — Progress Notes (Signed)
Filed Vitals:   06/09/15 1237 06/09/15 1243  BP: 143/71 147/76  Pulse: 55   Temp: 98.8 F (37.1 C)   TempSrc: Oral   Resp: 18   Height: 5' 2.5" (1.588 m)   Weight: 169 lb 3.2 oz (76.749 kg)   SpO2: 94%

## 2015-06-16 DIAGNOSIS — M1711 Unilateral primary osteoarthritis, right knee: Secondary | ICD-10-CM | POA: Diagnosis not present

## 2015-06-16 DIAGNOSIS — M25562 Pain in left knee: Secondary | ICD-10-CM | POA: Diagnosis not present

## 2015-06-16 DIAGNOSIS — M170 Bilateral primary osteoarthritis of knee: Secondary | ICD-10-CM | POA: Diagnosis not present

## 2015-06-16 DIAGNOSIS — M25561 Pain in right knee: Secondary | ICD-10-CM | POA: Diagnosis not present

## 2015-06-23 DIAGNOSIS — M1711 Unilateral primary osteoarthritis, right knee: Secondary | ICD-10-CM | POA: Diagnosis not present

## 2015-06-23 DIAGNOSIS — M25561 Pain in right knee: Secondary | ICD-10-CM | POA: Diagnosis not present

## 2015-06-30 ENCOUNTER — Encounter (HOSPITAL_COMMUNITY): Payer: Medicare Other

## 2015-06-30 ENCOUNTER — Ambulatory Visit: Payer: Medicare Other | Admitting: Vascular Surgery

## 2015-07-05 ENCOUNTER — Emergency Department (HOSPITAL_COMMUNITY)
Admission: EM | Admit: 2015-07-05 | Discharge: 2015-07-06 | Disposition: A | Discharge status: Home / Self Care | Payer: Medicare Other | Attending: Emergency Medicine | Admitting: Emergency Medicine

## 2015-07-05 ENCOUNTER — Emergency Department (HOSPITAL_COMMUNITY): Payer: Medicare Other

## 2015-07-05 ENCOUNTER — Encounter (HOSPITAL_COMMUNITY): Payer: Self-pay | Admitting: Emergency Medicine

## 2015-07-05 DIAGNOSIS — R9431 Abnormal electrocardiogram [ECG] [EKG]: Secondary | ICD-10-CM | POA: Diagnosis not present

## 2015-07-05 DIAGNOSIS — E7800 Pure hypercholesterolemia, unspecified: Secondary | ICD-10-CM | POA: Diagnosis not present

## 2015-07-05 DIAGNOSIS — R42 Dizziness and giddiness: Secondary | ICD-10-CM | POA: Insufficient documentation

## 2015-07-05 DIAGNOSIS — I739 Peripheral vascular disease, unspecified: Secondary | ICD-10-CM | POA: Diagnosis not present

## 2015-07-05 DIAGNOSIS — M1990 Unspecified osteoarthritis, unspecified site: Secondary | ICD-10-CM | POA: Insufficient documentation

## 2015-07-05 DIAGNOSIS — I2510 Atherosclerotic heart disease of native coronary artery without angina pectoris: Secondary | ICD-10-CM | POA: Insufficient documentation

## 2015-07-05 LAB — URINALYSIS, ROUTINE W REFLEX MICROSCOPIC
Bilirubin Urine: NEGATIVE
Glucose, UA: NEGATIVE mg/dL
Hgb urine dipstick: NEGATIVE
Ketones, ur: NEGATIVE mg/dL
Nitrite: NEGATIVE
Protein, ur: NEGATIVE mg/dL
Specific Gravity, Urine: 1.023 (ref 1.005–1.030)
pH: 6 (ref 5.0–8.0)

## 2015-07-05 LAB — URINE MICROSCOPIC-ADD ON
Bacteria, UA: NONE SEEN
RBC / HPF: NONE SEEN RBC/hpf (ref 0–5)

## 2015-07-05 LAB — BASIC METABOLIC PANEL
Anion gap: 7 (ref 5–15)
BUN: 19 mg/dL (ref 6–20)
CO2: 29 mmol/L (ref 22–32)
Calcium: 9 mg/dL (ref 8.9–10.3)
Chloride: 100 mmol/L — ABNORMAL LOW (ref 101–111)
Creatinine, Ser: 1.18 mg/dL — ABNORMAL HIGH (ref 0.44–1.00)
GFR calc Af Amer: 49 mL/min — ABNORMAL LOW (ref >60)
GFR calc non Af Amer: 43 mL/min — ABNORMAL LOW (ref >60)
Glucose, Bld: 139 mg/dL — ABNORMAL HIGH (ref 65–99)
Potassium: 4.7 mmol/L (ref 3.5–5.1)
Sodium: 136 mmol/L (ref 135–145)

## 2015-07-05 LAB — I-STAT TROPONIN, ED: Troponin i, poc: 0.01 ng/mL (ref 0.00–0.08)

## 2015-07-05 LAB — CBC
HCT: 42 % (ref 36.0–46.0)
Hemoglobin: 13.3 g/dL (ref 12.0–15.0)
MCH: 32 pg (ref 26.0–34.0)
MCHC: 31.7 g/dL (ref 30.0–36.0)
MCV: 101.2 fL — ABNORMAL HIGH (ref 78.0–100.0)
Platelets: 228 10*3/uL (ref 150–400)
RBC: 4.15 MIL/uL (ref 3.87–5.11)
RDW: 12.4 % (ref 11.5–15.5)
WBC: 6.8 10*3/uL (ref 4.0–10.5)

## 2015-07-05 LAB — CBG MONITORING, ED: Glucose-Capillary: 154 mg/dL — ABNORMAL HIGH (ref 65–99)

## 2015-07-05 MED ORDER — SODIUM CHLORIDE 0.9 % IV BOLUS (SEPSIS)
500.0000 mL | Freq: Once | INTRAVENOUS | Status: AC
  Administered 2015-07-06: 500 mL via INTRAVENOUS

## 2015-07-05 NOTE — ED Notes (Signed)
CBG 154. 

## 2015-07-05 NOTE — ED Notes (Signed)
Pt needed to go to the bathroom, while she was transported using a wheelchair, she reports that she felt "better", pt was stable on her feet.

## 2015-07-05 NOTE — ED Provider Notes (Signed)
CSN: [id number]     Arrival date & time 07/05/15  1831 History   First MD Initiated Contact with Patient 07/05/15 2228     Chief Complaint  Patient presents with  . Dizziness     (Consider location/radiation/quality/duration/timing/severity/associated sxs/prior Treatment) HPI [name] is a 82 y.o. female with PMH significant for CAD, hypercholesterolemia, PVD, internal carotid artery stenosis who presents with sudden onset, intermittent, improving dizziness that began approximately 2:15 pm this afternoon.  Patient reports she had just gotten out of the shower and was down stairs sitting in the chair in front of the TV when it started.  She describes it as feeling lightheaded and that she felt like she was moving.  Associated symptoms include nausea and gait disturbance. She states that is has improved since she got here (approximately 6 PM).  She denies dizziness currently.  Triggers seem to be moving from lying to sitting/standing and rolling over to one side.  Denies fever, headache, neck pain, numbness, weakness, diplopia, fever, CP, SOB, or abdominal pain.  This has never happened before.  She is on plavix and aspirin.   Cardiologist: Dr. Einar Gip Vascular: Dr. Oneida Alar   Past Medical History  Diagnosis Date  . Coronary artery disease   . Hypercholesteremia   . Chronic cough   . Arthritis   . Peripheral vascular disease ([place])   . Internal carotid artery stenosis 08/2012  . Normal cardiac stress test 08/2012    @ Dr Irven Shelling office   Past Surgical History  Procedure Laterality Date  . Abdominal hysterectomy    . Appendectomy    . Eye surgery      both cataracts  . Orif ankle fracture  06/14/2011    Procedure: OPEN REDUCTION INTERNAL FIXATION (ORIF) ANKLE FRACTURE;  Surgeon: Ninetta Lights, MD;  Location: [facility];  Service: Orthopedics;  Laterality: Left;  left ankle fracture open treatment trimalleolar ankle includes internal fixation WITHOUT fixation of  posterior lip  . Coronary artery bypass graft    . Shoulder arthroscopy with subacromial decompression, rotator cuff repair and bicep tendon repair Left 06/05/2012    Procedure: LEFT SHOULDER ARTHROSCOPY WITH SUBACROMIAL DECOMPRESSION, PARTIAL ACROMIOPLASTY WITH CORACOACROMIAL RELEASE, DISTAL CLAVICULECTOMY, WITH ROTATOR CUFF REPAIR AND BICEP TENODESIS;  Surgeon: Ninetta Lights, MD;  Location: [facility];  Service: Orthopedics;  Laterality: Left;  . Endarterectomy Right 08/25/2012    Procedure: ENDARTERECTOMY CAROTID;  Surgeon: Elam Dutch, MD;  Location: [facility];  Service: Vascular;  Laterality: Right;  . Fracture surgery Left Jun 14, 2011    Ankle  . Carotid endarterectomy Right August 25, 2012    cea  . Carotid angiogram N/A 04/01/2013    Procedure: CAROTID ANGIOGRAM;  Surgeon: Elam Dutch, MD;  Location: [facility] CATH LAB;  Service: Cardiovascular;  Laterality: N/A;  . Carotid stent insertion Right 04/22/2013    Procedure: CAROTID STENT INSERTION;  Surgeon: Elam Dutch, MD;  Location: [facility] CATH LAB;  Service: Cardiovascular;  Laterality: Right;   Family History  Problem Relation Age of Onset  . Cancer Father     Brain   . Diabetes Sister   . Heart disease Sister     after age 46  . Hyperlipidemia Sister   . Heart disease Brother     After age 78   Social History  Substance Use Topics  . Smoking status: Never Smoker   . Smokeless tobacco: Never Used  . Alcohol Use: No   OB History  No data available     Review of Systems All other systems negative unless otherwise stated in HPI     Allergies  Hydrocodone; Fenofibrate; Oxycodone; and Latex  Home Medications   Prior to Admission medications   Medication Sig Start Date End Date Taking? Authorizing Provider  aspirin 81 MG tablet Take 81 mg by mouth daily.   Yes Historical Provider, MD  cholecalciferol (VITAMIN D) 1000 units tablet Take 2,000 Units by mouth daily.   Yes Historical Provider, MD   citalopram (CELEXA) 20 MG tablet Take 20 mg by mouth daily.  05/01/13  Yes Historical Provider, MD  clopidogrel (PLAVIX) 75 MG tablet Take 1 tablet (75 mg total) by mouth daily. 04/01/13  Yes Elam Dutch, MD  ezetimibe (ZETIA) 10 MG tablet Take 5 mg by mouth daily.    Yes Historical Provider, MD  losartan (COZAAR) 100 MG tablet Take 0.5 tablets (50 mg total) by mouth daily. 08/29/12  Yes Samantha J Rhyne, PA-C  metoprolol tartrate (LOPRESSOR) 25 MG tablet Take 1 tablet (25 mg total) by mouth 2 (two) times daily. 08/29/12  Yes Samantha J Rhyne, PA-C  Multiple Vitamins-Minerals (PRESERVISION AREDS PO) Take 2 tablets by mouth 2 (two) times daily.   Yes Historical Provider, MD  rosuvastatin (CRESTOR) 20 MG tablet Take 20 mg by mouth daily.   Yes Historical Provider, MD   BP 147/58 mmHg  Pulse 58  Temp(Src) 98.1 F (36.7 C) (Oral)  Resp 28  SpO2 92% Physical Exam  Constitutional: She is oriented to person, place, and time. She appears well-developed and well-nourished.  Non-toxic appearance. She does not have a sickly appearance. She does not appear ill.  HENT:  Head: Normocephalic and atraumatic.  Mouth/Throat: Oropharynx is clear and moist.  Eyes: Conjunctivae are normal. Pupils are equal, round, and reactive to light.  Neck: Normal range of motion. Neck supple.  Cardiovascular: Normal rate and regular rhythm.   Pulmonary/Chest: Effort normal and breath sounds normal. No accessory muscle usage or stridor. No respiratory distress. She has no wheezes. She has no rhonchi. She has no rales.  Abdominal: Soft. Bowel sounds are normal. She exhibits no distension. There is no tenderness.  Musculoskeletal: Normal range of motion.  Lymphadenopathy:    She has no cervical adenopathy.  Neurological: She is alert and oriented to person, place, and time.  Mental Status:   AOx3.  Speech clear without dysarthria. Cranial Nerves:  I-not tested  II-PERRLA  III, IV, VI-EOMs intact  V-temporal and  masseter strength intact  VII-symmetrical facial movements intact, no facial droop  VIII-hearing grossly intact bilaterally  IX, X-gag intact  XI-strength of sternomastoid and trapezius muscles 5/5  XII-tongue midline Motor:   Good muscle bulk and tone  Strength 5/5 bilaterally in upper and lower extremities   Cerebellar--intact RAMs, finger to nose intact bilaterally.  Negative Romberg. Gait without slow without ataxia.  No pronator drift Sensory:  Intact in upper and lower extremities   Skin: Skin is warm and dry.  Psychiatric: She has a normal mood and affect. Her behavior is normal.    ED Course  Procedures (including critical care time) Labs Review Labs Reviewed  BASIC METABOLIC PANEL - Abnormal; Notable for the following:    Chloride 100 (*)    Glucose, Bld 139 (*)    Creatinine, Ser 1.18 (*)    GFR calc non Af Amer 43 (*)    GFR calc Af Amer 49 (*)    All other components within normal limits  CBC - Abnormal; Notable for the following:    MCV 101.2 (*)    All other components within normal limits  URINALYSIS, ROUTINE W REFLEX MICROSCOPIC (NOT AT [facility]) - Abnormal; Notable for the following:    Leukocytes, UA SMALL (*)    All other components within normal limits  URINE MICROSCOPIC-ADD ON - Abnormal; Notable for the following:    Squamous Epithelial / LPF 0-5 (*)    All other components within normal limits  CBG MONITORING, ED - Abnormal; Notable for the following:    Glucose-Capillary 154 (*)    All other components within normal limits  I-STAT TROPOININ, ED  I-STAT TROPOININ, ED    Imaging Review Ct Head Wo Contrast  07/05/2015  CLINICAL DATA:  Acute onset of nausea and dizziness. Initial encounter. EXAM: CT HEAD WITHOUT CONTRAST TECHNIQUE: Contiguous axial images were obtained from the base of the skull through the vertex without intravenous contrast. COMPARISON:  None. FINDINGS: There is no evidence of acute infarction, mass lesion, or intra- or extra-axial  hemorrhage on CT. Prominence of the sulci suggests mild cortical volume loss. Mild periventricular and subcortical white matter change likely reflects small vessel ischemic microangiopathy. The brainstem and fourth ventricle are within normal limits. The basal ganglia are unremarkable in appearance. The cerebral hemispheres demonstrate grossly normal gray-white differentiation. No mass effect or midline shift is seen. There is no evidence of fracture; visualized osseous structures are unremarkable in appearance. The orbits are within normal limits. The paranasal sinuses and mastoid air cells are well-aerated. No significant soft tissue abnormalities are seen. IMPRESSION: 1. No acute intracranial pathology seen on CT. 2. Mild cortical volume loss and scattered small vessel ischemic microangiopathy. Electronically Signed   By: Garald Balding M.D.   On: 07/05/2015 21:09   I have personally reviewed and evaluated these images and lab results as part of my medical decision-making.   EKG Interpretation   Date/Time:  Tuesday Jul 05 2015 18:40:47 EDT Ventricular Rate:  70 PR Interval:    QRS Duration: 82 QT Interval:  418 QTC Calculation: 451 R Axis:   73 Text Interpretation:  Normal sinus rhythm Artifact  not paced now  Confirmed by CAMPOS  MD, KEVIN ([id number]) on 07/06/2015 2:11:15 AM      MDM   Final diagnoses:  Dizziness   Patient with hx of carotid stenosis, CAD, PVD presents with dizziness described as lightheaded and spinning that began at 2:15 pm and has been gradually improving. VSS, NAD.  On exam, heart RRR, lungs CTAB, abdomen soft and benign.  Normal neuro exam. DDx includes CVA, TIA, vertebral dissection.  Upon reassessment, patient reports improvement of dizziness.  She is able to walk in the room with me.  Possible mild orthostasis.  Will give fluids.  CT head normal.  Labs without acute abnormalities. Troponin x 2 negative.  EKG NSR.  Low suspicion for CVA, TIA, SAH, ACS, or dissection.   No indication for further imaging with brain MR, will not change treatment.  Patient currently on Plavix and Aspirin, has good follow up with cardiology and vascular.  Patient able to ambulate in ED without difficulty.  No complaints of dizziness.  Plan to discharge home with PCP follow up.  Discussed return precautions.  Patient agrees and acknowledges the above plan for discharge.   Case has been discussed with Dr. Venora Maples who agrees with the above plan for discharge.        Gloriann Loan, PA-C 07/06/15 BP:[id number]  Jola Schmidt, MD 07/06/15  0848 

## 2015-07-05 NOTE — ED Notes (Signed)
Pt here for dizziness and nausea this afternoon

## 2015-07-06 DIAGNOSIS — R42 Dizziness and giddiness: Secondary | ICD-10-CM | POA: Diagnosis not present

## 2015-07-06 LAB — I-STAT TROPONIN, ED: Troponin i, poc: 0 ng/mL (ref 0.00–0.08)

## 2015-07-06 NOTE — Discharge Instructions (Signed)
Your lab work today is unremarkable.  Your EKG is normal. Your head CT is normal.  I suspect your dizziness is related to not drinking enough water.  Please follow up with your primary care doctor.  Return to the ED if you experience worsening dizziness, feel like you are going to pass out, severe headache, neck pain, or chest pain.   Dizziness Dizziness is a common problem. It is a feeling of unsteadiness or light-headedness. You may feel like you are about to faint. Dizziness can lead to injury if you stumble or fall. Anyone can become dizzy, but dizziness is more common in older adults. This condition can be caused by a number of things, including medicines, dehydration, or illness. HOME CARE INSTRUCTIONS Taking these steps may help with your condition: Eating and Drinking  Drink enough fluid to keep your urine clear or pale yellow. This helps to keep you from becoming dehydrated. Try to drink more clear fluids, such as water.  Do not drink alcohol.  Limit your caffeine intake if directed by your health care provider.  Limit your salt intake if directed by your health care provider. Activity  Avoid making quick movements.  Rise slowly from chairs and steady yourself until you feel okay.  In the morning, first sit up on the side of the bed. When you feel okay, stand slowly while you hold onto something until you know that your balance is fine.  Move your legs often if you need to stand in one place for a long time. Tighten and relax your muscles in your legs while you are standing.  Do not drive or operate heavy machinery if you feel dizzy.  Avoid bending down if you feel dizzy. Place items in your home so that they are easy for you to reach without leaning over. Lifestyle  Do not use any tobacco products, including cigarettes, chewing tobacco, or electronic cigarettes. If you need help quitting, ask your health care provider.  Try to reduce your stress level, such as with yoga or  meditation. Talk with your health care provider if you need help. General Instructions  Watch your dizziness for any changes.  Take medicines only as directed by your health care provider. Talk with your health care provider if you think that your dizziness is caused by a medicine that you are taking.  Tell a friend or a family member that you are feeling dizzy. If he or she notices any changes in your behavior, have this person call your health care provider.  Keep all follow-up visits as directed by your health care provider. This is important. SEEK MEDICAL CARE IF:  Your dizziness does not go away.  Your dizziness or light-headedness gets worse.  You feel nauseous.  You have reduced hearing.  You have new symptoms.  You are unsteady on your feet or you feel like the room is spinning. SEEK IMMEDIATE MEDICAL CARE IF:  You vomit or have diarrhea and are unable to eat or drink anything.  You have problems talking, walking, swallowing, or using your arms, hands, or legs.  You feel generally weak.  You are not thinking clearly or you have trouble forming sentences. It may take a friend or family member to notice this.  You have chest pain, abdominal pain, shortness of breath, or sweating.  Your vision changes.  You notice any bleeding.  You have a headache.  You have neck pain or a stiff neck.  You have a fever.   This information  is not intended to replace advice given to you by your health care provider. Make sure you discuss any questions you have with your health care provider.   Document Released: 07/18/2000 Document Revised: 06/08/2014 Document Reviewed: 01/18/2014 Elsevier Interactive Patient Education [employer].

## 2015-07-06 NOTE — ED Notes (Signed)
Patient ambulated with no problems nor concerns.

## 2015-07-07 ENCOUNTER — Encounter (HOSPITAL_COMMUNITY): Payer: Medicare Other

## 2015-07-07 ENCOUNTER — Ambulatory Visit: Payer: Medicare Other | Admitting: Vascular Surgery

## 2015-07-07 DIAGNOSIS — M1711 Unilateral primary osteoarthritis, right knee: Secondary | ICD-10-CM | POA: Diagnosis not present

## 2015-07-07 DIAGNOSIS — M25561 Pain in right knee: Secondary | ICD-10-CM | POA: Diagnosis not present

## 2015-07-12 DIAGNOSIS — M25561 Pain in right knee: Secondary | ICD-10-CM | POA: Diagnosis not present

## 2015-07-12 DIAGNOSIS — Z86010 Personal history of colonic polyps: Secondary | ICD-10-CM | POA: Diagnosis not present

## 2015-07-12 DIAGNOSIS — I679 Cerebrovascular disease, unspecified: Secondary | ICD-10-CM | POA: Diagnosis not present

## 2015-07-12 DIAGNOSIS — I10 Essential (primary) hypertension: Secondary | ICD-10-CM | POA: Diagnosis not present

## 2015-07-12 DIAGNOSIS — M1711 Unilateral primary osteoarthritis, right knee: Secondary | ICD-10-CM | POA: Diagnosis not present

## 2015-07-25 NOTE — Addendum Note (Signed)
Addended by: Mena Goes on: 07/25/2015 05:03 PM   Modules accepted: Orders

## 2015-08-02 DIAGNOSIS — K5730 Diverticulosis of large intestine without perforation or abscess without bleeding: Secondary | ICD-10-CM | POA: Diagnosis not present

## 2015-08-02 DIAGNOSIS — Z86010 Personal history of colonic polyps: Secondary | ICD-10-CM | POA: Diagnosis not present

## 2015-08-02 DIAGNOSIS — K640 First degree hemorrhoids: Secondary | ICD-10-CM | POA: Diagnosis not present

## 2015-08-11 DIAGNOSIS — I10 Essential (primary) hypertension: Secondary | ICD-10-CM | POA: Diagnosis not present

## 2015-08-11 DIAGNOSIS — Z131 Encounter for screening for diabetes mellitus: Secondary | ICD-10-CM | POA: Diagnosis not present

## 2015-08-11 DIAGNOSIS — R739 Hyperglycemia, unspecified: Secondary | ICD-10-CM | POA: Diagnosis not present

## 2015-08-11 DIAGNOSIS — E559 Vitamin D deficiency, unspecified: Secondary | ICD-10-CM | POA: Diagnosis not present

## 2015-08-16 DIAGNOSIS — R739 Hyperglycemia, unspecified: Secondary | ICD-10-CM | POA: Diagnosis not present

## 2015-08-16 DIAGNOSIS — E7800 Pure hypercholesterolemia, unspecified: Secondary | ICD-10-CM | POA: Diagnosis not present

## 2015-08-16 DIAGNOSIS — E559 Vitamin D deficiency, unspecified: Secondary | ICD-10-CM | POA: Diagnosis not present

## 2015-08-16 DIAGNOSIS — I10 Essential (primary) hypertension: Secondary | ICD-10-CM | POA: Diagnosis not present

## 2015-09-14 DIAGNOSIS — R5383 Other fatigue: Secondary | ICD-10-CM | POA: Diagnosis not present

## 2015-09-14 DIAGNOSIS — E559 Vitamin D deficiency, unspecified: Secondary | ICD-10-CM | POA: Diagnosis not present

## 2015-09-14 DIAGNOSIS — M810 Age-related osteoporosis without current pathological fracture: Secondary | ICD-10-CM | POA: Diagnosis not present

## 2015-09-21 DIAGNOSIS — M810 Age-related osteoporosis without current pathological fracture: Secondary | ICD-10-CM | POA: Diagnosis not present

## 2015-09-21 DIAGNOSIS — E559 Vitamin D deficiency, unspecified: Secondary | ICD-10-CM | POA: Diagnosis not present

## 2015-10-03 ENCOUNTER — Other Ambulatory Visit: Payer: Self-pay | Admitting: Internal Medicine

## 2015-10-03 DIAGNOSIS — Z1231 Encounter for screening mammogram for malignant neoplasm of breast: Secondary | ICD-10-CM

## 2015-10-04 ENCOUNTER — Other Ambulatory Visit: Payer: Self-pay

## 2015-11-10 ENCOUNTER — Ambulatory Visit: Payer: Medicare Other

## 2015-11-17 ENCOUNTER — Ambulatory Visit
Admission: RE | Admit: 2015-11-17 | Discharge: 2015-11-17 | Disposition: A | Discharge status: Home / Self Care | Payer: Medicare Other | Source: Ambulatory Visit | Attending: Internal Medicine | Admitting: Internal Medicine

## 2015-11-17 DIAGNOSIS — Z1231 Encounter for screening mammogram for malignant neoplasm of breast: Secondary | ICD-10-CM | POA: Diagnosis not present

## 2015-12-13 DIAGNOSIS — I6523 Occlusion and stenosis of bilateral carotid arteries: Secondary | ICD-10-CM | POA: Diagnosis not present

## 2015-12-13 DIAGNOSIS — E782 Mixed hyperlipidemia: Secondary | ICD-10-CM | POA: Diagnosis not present

## 2015-12-13 DIAGNOSIS — I2510 Atherosclerotic heart disease of native coronary artery without angina pectoris: Secondary | ICD-10-CM | POA: Diagnosis not present

## 2015-12-13 DIAGNOSIS — I10 Essential (primary) hypertension: Secondary | ICD-10-CM | POA: Diagnosis not present

## 2016-02-14 DIAGNOSIS — I10 Essential (primary) hypertension: Secondary | ICD-10-CM | POA: Diagnosis not present

## 2016-02-14 DIAGNOSIS — E559 Vitamin D deficiency, unspecified: Secondary | ICD-10-CM | POA: Diagnosis not present

## 2016-02-14 DIAGNOSIS — E7800 Pure hypercholesterolemia, unspecified: Secondary | ICD-10-CM | POA: Diagnosis not present

## 2016-02-14 DIAGNOSIS — M810 Age-related osteoporosis without current pathological fracture: Secondary | ICD-10-CM | POA: Diagnosis not present

## 2016-02-16 DIAGNOSIS — Z23 Encounter for immunization: Secondary | ICD-10-CM | POA: Diagnosis not present

## 2016-02-16 DIAGNOSIS — Z0000 Encounter for general adult medical examination without abnormal findings: Secondary | ICD-10-CM | POA: Diagnosis not present

## 2016-02-16 DIAGNOSIS — R739 Hyperglycemia, unspecified: Secondary | ICD-10-CM | POA: Diagnosis not present

## 2016-02-16 DIAGNOSIS — R251 Tremor, unspecified: Secondary | ICD-10-CM | POA: Diagnosis not present

## 2016-02-16 DIAGNOSIS — L989 Disorder of the skin and subcutaneous tissue, unspecified: Secondary | ICD-10-CM | POA: Diagnosis not present

## 2016-02-27 ENCOUNTER — Other Ambulatory Visit: Payer: Self-pay | Admitting: Dermatology

## 2016-02-27 DIAGNOSIS — L820 Inflamed seborrheic keratosis: Secondary | ICD-10-CM | POA: Diagnosis not present

## 2016-02-27 DIAGNOSIS — C44612 Basal cell carcinoma of skin of right upper limb, including shoulder: Secondary | ICD-10-CM | POA: Diagnosis not present

## 2016-03-07 DIAGNOSIS — M25561 Pain in right knee: Secondary | ICD-10-CM | POA: Diagnosis not present

## 2016-03-07 DIAGNOSIS — M1711 Unilateral primary osteoarthritis, right knee: Secondary | ICD-10-CM | POA: Diagnosis not present

## 2016-03-15 DIAGNOSIS — M1711 Unilateral primary osteoarthritis, right knee: Secondary | ICD-10-CM | POA: Diagnosis not present

## 2016-03-15 DIAGNOSIS — M25561 Pain in right knee: Secondary | ICD-10-CM | POA: Diagnosis not present

## 2016-03-19 DIAGNOSIS — E559 Vitamin D deficiency, unspecified: Secondary | ICD-10-CM | POA: Diagnosis not present

## 2016-03-19 DIAGNOSIS — M810 Age-related osteoporosis without current pathological fracture: Secondary | ICD-10-CM | POA: Diagnosis not present

## 2016-03-19 DIAGNOSIS — R5383 Other fatigue: Secondary | ICD-10-CM | POA: Diagnosis not present

## 2016-03-22 DIAGNOSIS — M1711 Unilateral primary osteoarthritis, right knee: Secondary | ICD-10-CM | POA: Diagnosis not present

## 2016-03-22 DIAGNOSIS — M25561 Pain in right knee: Secondary | ICD-10-CM | POA: Diagnosis not present

## 2016-03-23 DIAGNOSIS — M810 Age-related osteoporosis without current pathological fracture: Secondary | ICD-10-CM | POA: Diagnosis not present

## 2016-03-23 DIAGNOSIS — E559 Vitamin D deficiency, unspecified: Secondary | ICD-10-CM | POA: Diagnosis not present

## 2016-03-29 DIAGNOSIS — M25561 Pain in right knee: Secondary | ICD-10-CM | POA: Diagnosis not present

## 2016-03-29 DIAGNOSIS — M1711 Unilateral primary osteoarthritis, right knee: Secondary | ICD-10-CM | POA: Diagnosis not present

## 2016-04-18 DIAGNOSIS — L570 Actinic keratosis: Secondary | ICD-10-CM | POA: Diagnosis not present

## 2016-04-18 DIAGNOSIS — Z85828 Personal history of other malignant neoplasm of skin: Secondary | ICD-10-CM | POA: Diagnosis not present

## 2016-04-18 DIAGNOSIS — L905 Scar conditions and fibrosis of skin: Secondary | ICD-10-CM | POA: Diagnosis not present

## 2016-05-31 ENCOUNTER — Encounter (INDEPENDENT_AMBULATORY_CARE_PROVIDER_SITE_OTHER): Payer: Medicare Other | Admitting: Ophthalmology

## 2016-06-11 DIAGNOSIS — I10 Essential (primary) hypertension: Secondary | ICD-10-CM | POA: Diagnosis not present

## 2016-06-11 DIAGNOSIS — E7800 Pure hypercholesterolemia, unspecified: Secondary | ICD-10-CM | POA: Diagnosis not present

## 2016-06-11 DIAGNOSIS — R739 Hyperglycemia, unspecified: Secondary | ICD-10-CM | POA: Diagnosis not present

## 2016-06-14 DIAGNOSIS — I10 Essential (primary) hypertension: Secondary | ICD-10-CM | POA: Diagnosis not present

## 2016-06-14 DIAGNOSIS — R739 Hyperglycemia, unspecified: Secondary | ICD-10-CM | POA: Diagnosis not present

## 2016-06-14 DIAGNOSIS — I6529 Occlusion and stenosis of unspecified carotid artery: Secondary | ICD-10-CM | POA: Diagnosis not present

## 2016-06-20 ENCOUNTER — Encounter (INDEPENDENT_AMBULATORY_CARE_PROVIDER_SITE_OTHER): Payer: Medicare Other | Admitting: Ophthalmology

## 2016-06-20 DIAGNOSIS — H353112 Nonexudative age-related macular degeneration, right eye, intermediate dry stage: Secondary | ICD-10-CM

## 2016-06-20 DIAGNOSIS — H353121 Nonexudative age-related macular degeneration, left eye, early dry stage: Secondary | ICD-10-CM

## 2016-06-20 DIAGNOSIS — I10 Essential (primary) hypertension: Secondary | ICD-10-CM | POA: Diagnosis not present

## 2016-06-20 DIAGNOSIS — H43813 Vitreous degeneration, bilateral: Secondary | ICD-10-CM

## 2016-06-20 DIAGNOSIS — H35033 Hypertensive retinopathy, bilateral: Secondary | ICD-10-CM

## 2016-07-05 DIAGNOSIS — M1711 Unilateral primary osteoarthritis, right knee: Secondary | ICD-10-CM | POA: Diagnosis not present

## 2016-07-05 DIAGNOSIS — M25561 Pain in right knee: Secondary | ICD-10-CM | POA: Diagnosis not present

## 2016-07-12 DIAGNOSIS — M25561 Pain in right knee: Secondary | ICD-10-CM | POA: Diagnosis not present

## 2016-07-12 DIAGNOSIS — M1711 Unilateral primary osteoarthritis, right knee: Secondary | ICD-10-CM | POA: Diagnosis not present

## 2016-07-19 ENCOUNTER — Ambulatory Visit (HOSPITAL_COMMUNITY)
Admission: RE | Admit: 2016-07-19 | Discharge: 2016-07-19 | Disposition: A | Discharge status: Home / Self Care | Payer: Medicare Other | Source: Ambulatory Visit | Attending: Vascular Surgery | Admitting: Vascular Surgery

## 2016-07-19 ENCOUNTER — Ambulatory Visit: Payer: Medicare Other | Admitting: Vascular Surgery

## 2016-07-19 DIAGNOSIS — I6523 Occlusion and stenosis of bilateral carotid arteries: Secondary | ICD-10-CM | POA: Insufficient documentation

## 2016-07-19 DIAGNOSIS — M25561 Pain in right knee: Secondary | ICD-10-CM | POA: Diagnosis not present

## 2016-07-19 DIAGNOSIS — M1711 Unilateral primary osteoarthritis, right knee: Secondary | ICD-10-CM | POA: Diagnosis not present

## 2016-07-19 LAB — VAS US CAROTID
LEFT ECA DIAS: -11 cm/s
LEFT VERTEBRAL DIAS: 12 cm/s
Left CCA dist dias: 13 cm/s
Left CCA dist sys: 96 cm/s
Left CCA prox dias: 16 cm/s
Left CCA prox sys: 108 cm/s
RIGHT CCA MID DIAS: 16 cm/s
RIGHT ECA DIAS: -11 cm/s
RIGHT VERTEBRAL DIAS: -10 cm/s
Right CCA prox dias: 18 cm/s
Right CCA prox sys: 104 cm/s
Right cca dist sys: -90 cm/s

## 2016-07-20 ENCOUNTER — Encounter: Payer: Self-pay | Admitting: Vascular Surgery

## 2016-07-26 DIAGNOSIS — M25561 Pain in right knee: Secondary | ICD-10-CM | POA: Diagnosis not present

## 2016-07-26 DIAGNOSIS — M1711 Unilateral primary osteoarthritis, right knee: Secondary | ICD-10-CM | POA: Diagnosis not present

## 2016-08-02 ENCOUNTER — Ambulatory Visit (INDEPENDENT_AMBULATORY_CARE_PROVIDER_SITE_OTHER): Payer: Medicare Other | Admitting: Vascular Surgery

## 2016-08-02 ENCOUNTER — Encounter: Payer: Self-pay | Admitting: Vascular Surgery

## 2016-08-02 VITALS — BP 88/57 | HR 66 | Temp 98.2°F | Resp 18 | Ht 62.5 in | Wt 169.0 lb

## 2016-08-02 DIAGNOSIS — M25561 Pain in right knee: Secondary | ICD-10-CM | POA: Diagnosis not present

## 2016-08-02 DIAGNOSIS — I6523 Occlusion and stenosis of bilateral carotid arteries: Secondary | ICD-10-CM

## 2016-08-02 DIAGNOSIS — M1711 Unilateral primary osteoarthritis, right knee: Secondary | ICD-10-CM | POA: Diagnosis not present

## 2016-08-02 NOTE — Progress Notes (Addendum)
HISTORY AND PHYSICAL     CC:  Follow up Requesting Provider:  [name], MD  HPI: This is a 83 y.o. female who is s/p right carotid artery stenting in March 2015 for re-stenosis of previous carotid endarterectomy that ws done in July 2014 and presents today for follow up.  She denies any numbness or weakness on either side of the body, speech difficulty, or amaurosis fugax.  She does take Plavix and aspirin and has not had any bleeding episodes other than bruising.    The pt is on a statin for cholesterol management.  She is on a beta blocker and ARB for blood pressure management.  She has never smoked.   Past Medical History:  Diagnosis Date  . Arthritis   . Chronic cough   . Coronary artery disease   . Hypercholesteremia   . Internal carotid artery stenosis 08/2012  . Normal cardiac stress test 08/2012   @ Dr Irven Shelling office  . Peripheral vascular disease [facility])     Past Surgical History:  Procedure Laterality Date  . ABDOMINAL HYSTERECTOMY    . APPENDECTOMY    . CAROTID ANGIOGRAM N/A 04/01/2013   Procedure: CAROTID ANGIOGRAM;  Surgeon: Elam Dutch, MD;  Location: [facility] CATH LAB;  Service: Cardiovascular;  Laterality: N/A;  . CAROTID ENDARTERECTOMY Right August 25, 2012   cea  . CAROTID STENT INSERTION Right 04/22/2013   Procedure: CAROTID STENT INSERTION;  Surgeon: Elam Dutch, MD;  Location: [facility] CATH LAB;  Service: Cardiovascular;  Laterality: Right;  . CORONARY ARTERY BYPASS GRAFT    . ENDARTERECTOMY Right 08/25/2012   Procedure: ENDARTERECTOMY CAROTID;  Surgeon: Elam Dutch, MD;  Location: [place];  Service: Vascular;  Laterality: Right;  . EYE SURGERY     both cataracts  . FRACTURE SURGERY Left Jun 14, 2011   Ankle  . ORIF ANKLE FRACTURE  06/14/2011   Procedure: OPEN REDUCTION INTERNAL FIXATION (ORIF) ANKLE FRACTURE;  Surgeon: Ninetta Lights, MD;  Location: [place];  Service: Orthopedics;  Laterality: Left;  left ankle fracture open treatment  trimalleolar ankle includes internal fixation WITHOUT fixation of posterior lip  . SHOULDER ARTHROSCOPY WITH SUBACROMIAL DECOMPRESSION, ROTATOR CUFF REPAIR AND BICEP TENDON REPAIR Left 06/05/2012   Procedure: LEFT SHOULDER ARTHROSCOPY WITH SUBACROMIAL DECOMPRESSION, PARTIAL ACROMIOPLASTY WITH CORACOACROMIAL RELEASE, DISTAL CLAVICULECTOMY, WITH ROTATOR CUFF REPAIR AND BICEP TENODESIS;  Surgeon: Ninetta Lights, MD;  Location: [place];  Service: Orthopedics;  Laterality: Left;    Allergies  Allergen Reactions  . Hydrocodone Shortness Of Breath    Rash-itching-tongue swelled  . Fenofibrate Other (See Comments)    Caused every joint to become stif  . Oxycodone Swelling    Rash-itching  . Latex Rash and Other (See Comments)    "Blisters in mouth"    Current Outpatient Prescriptions  Medication Sig Dispense Refill  . aspirin 81 MG tablet Take 81 mg by mouth daily.    . cholecalciferol (VITAMIN D) 1000 units tablet Take 2,000 Units by mouth daily.    . citalopram (CELEXA) 20 MG tablet Take 20 mg by mouth daily.     . clopidogrel (PLAVIX) 75 MG tablet Take 1 tablet (75 mg total) by mouth daily. 30 tablet 11  . ezetimibe (ZETIA) 10 MG tablet Take 5 mg by mouth daily.     Marland Kitchen losartan (COZAAR) 100 MG tablet Take 0.5 tablets (50 mg total) by mouth daily.    . metoprolol tartrate (LOPRESSOR) 25 MG tablet Take  1 tablet (25 mg total) by mouth 2 (two) times daily.    . Multiple Vitamins-Minerals (PRESERVISION AREDS PO) Take 2 tablets by mouth 2 (two) times daily.    . rosuvastatin (CRESTOR) 20 MG tablet Take 20 mg by mouth daily.     No current facility-administered medications for this visit.     Family History  Problem Relation Age of Onset  . Cancer Father        Brain   . Diabetes Sister   . Heart disease Sister        after age 93  . Hyperlipidemia Sister   . Heart disease Brother        After age 86    Social History   Social History  . Marital status: Widowed     Spouse name: N/A  . Number of children: N/A  . Years of education: N/A   Occupational History  . Not on file.   Social History Main Topics  . Smoking status: Never Smoker  . Smokeless tobacco: Never Used  . Alcohol use No  . Drug use: No  . Sexual activity: No   Other Topics Concern  . Not on file   Social History Narrative  . No narrative on file     REVIEW OF SYSTEMS:   [X]  denotes positive finding, [ ]  denotes negative finding Cardiac  Comments:  Chest pain or chest pressure:    Shortness of breath upon exertion:    Short of breath when lying flat:    Irregular heart rhythm:        Vascular    Pain in calf, thigh, or hip brought on by ambulation:    Pain in feet at night that wakes you up from your sleep:     Blood clot in your veins:    Leg swelling:         Pulmonary    Oxygen at home:    Productive cough:  x   Wheezing:         Neurologic    Sudden weakness in arms or legs:     Sudden numbness in arms or legs:     Sudden onset of difficulty speaking or slurred speech:    Temporary loss of vision in one eye:     Problems with dizziness:         Gastrointestinal    Blood in stool:     Vomited blood:         Genitourinary    Burning when urinating:     Blood in urine:        Psychiatric    Major depression:         Hematologic    Bleeding problems:    Problems with blood clotting too easily:        Skin    Rashes or ulcers:        Constitutional    Fever or chills:      PHYSICAL EXAMINATION:  Vitals:   08/02/16 1345 08/02/16 1348  BP: 96/64 (!) 88/57  Pulse: 66   Resp: 18   Temp: 98.2 F (36.8 C)    Body mass index is 30.42 kg/m.  General:  WDWN in NAD; vital signs documented above Gait: Not observed HENT: WNL, normocephalic Pulmonary: normal non-labored breathing , without Rales, rhonchi,  wheezing Cardiac: regular HR, without  Murmurs, rubs or gallops; without carotid bruits Abdomen: soft, NT, no masses Skin: without  rashes Vascular Exam/Pulses:  Right Left  Radial 2+ (normal) 2+ (normal)  DP Unable to palpate  Unable to palpate   PT Unable to palpate  Unable to palpate    Extremities: without ischemic changes, without Gangrene , without cellulitis; without open wounds;  Musculoskeletal: no muscle wasting or atrophy  Neurologic: A&O X 3;  No focal weakness or paresthesias are detected Psychiatric:  The pt has Normal affect.   Non-Invasive Vascular Imaging:   Carotid Duplex on 07/19/16: 1.  Patent right carotid stent without evidence of re-stenosis 2.  Less than 40% left ICA stenosis 3.  Bilateral external carotid artery stenosis   Pt meds includes: Statin:  Yes.   Beta Blocker:  Yes. Aspirin:  Yes.   ACEI:  No. ARB:  Yes.   CCB use:  No Other Antiplatelet/Anticoagulant:  Yes Plavix   ASSESSMENT/PLAN:: 83 y.o. female ight carotid artery stenting in March 2015 for re-stenosis of previous carotid endarterectomy that ws done in July 2014 by Dr. Oneida Alar   -pt is doing well and is asymptomatic -her carotid duplex reveals that her carotid stent is patent without re-stenosis and <40% stenosis of bilateral ICA. -she will continue her Plavix/aspirn -f/u in one year with carotid duplex.  She will call sooner if she has any issues or sx of stroke.   Leontine Locket, PA-C [facility] [phone]  Clinic MD:  Pt seen and examined with Dr. Oneida Alar  History and exam details as above. No significant recurrent carotid occlusive disease. She remains asymptomatic. She will follow-up in one year. She will continue her antiplatelet and statin therapy.  Ruta Hinds, MD [facility] [place] Office: [phone] Pager: [phone]

## 2016-08-15 NOTE — Addendum Note (Signed)
Addended by: Lianne Cure A on: 08/15/2016 02:31 PM   Modules accepted: Orders

## 2016-08-27 DIAGNOSIS — R079 Chest pain, unspecified: Secondary | ICD-10-CM | POA: Diagnosis not present

## 2016-08-27 DIAGNOSIS — R2681 Unsteadiness on feet: Secondary | ICD-10-CM | POA: Diagnosis not present

## 2016-08-27 DIAGNOSIS — R0781 Pleurodynia: Secondary | ICD-10-CM | POA: Diagnosis not present

## 2016-09-03 DIAGNOSIS — S022XXA Fracture of nasal bones, initial encounter for closed fracture: Secondary | ICD-10-CM | POA: Diagnosis not present

## 2016-09-03 DIAGNOSIS — R040 Epistaxis: Secondary | ICD-10-CM | POA: Diagnosis not present

## 2016-09-19 DIAGNOSIS — R5383 Other fatigue: Secondary | ICD-10-CM | POA: Diagnosis not present

## 2016-09-19 DIAGNOSIS — E559 Vitamin D deficiency, unspecified: Secondary | ICD-10-CM | POA: Diagnosis not present

## 2016-09-19 DIAGNOSIS — M810 Age-related osteoporosis without current pathological fracture: Secondary | ICD-10-CM | POA: Diagnosis not present

## 2016-09-24 DIAGNOSIS — R079 Chest pain, unspecified: Secondary | ICD-10-CM | POA: Diagnosis not present

## 2016-09-28 DIAGNOSIS — M810 Age-related osteoporosis without current pathological fracture: Secondary | ICD-10-CM | POA: Diagnosis not present

## 2016-10-01 ENCOUNTER — Other Ambulatory Visit: Payer: Self-pay | Admitting: Sports Medicine

## 2016-10-01 DIAGNOSIS — M810 Age-related osteoporosis without current pathological fracture: Secondary | ICD-10-CM

## 2016-10-05 ENCOUNTER — Other Ambulatory Visit: Payer: Self-pay | Admitting: Internal Medicine

## 2016-10-05 DIAGNOSIS — Z1231 Encounter for screening mammogram for malignant neoplasm of breast: Secondary | ICD-10-CM

## 2016-10-15 ENCOUNTER — Other Ambulatory Visit: Payer: Medicare Other

## 2016-10-17 DIAGNOSIS — Z85828 Personal history of other malignant neoplasm of skin: Secondary | ICD-10-CM | POA: Diagnosis not present

## 2016-10-17 DIAGNOSIS — L905 Scar conditions and fibrosis of skin: Secondary | ICD-10-CM | POA: Diagnosis not present

## 2016-11-13 DIAGNOSIS — M25561 Pain in right knee: Secondary | ICD-10-CM | POA: Diagnosis not present

## 2016-11-13 DIAGNOSIS — M1711 Unilateral primary osteoarthritis, right knee: Secondary | ICD-10-CM | POA: Diagnosis not present

## 2016-11-19 ENCOUNTER — Ambulatory Visit
Admission: RE | Admit: 2016-11-19 | Discharge: 2016-11-19 | Disposition: A | Discharge status: Home / Self Care | Payer: Medicare Other | Source: Ambulatory Visit | Attending: Sports Medicine | Admitting: Sports Medicine

## 2016-11-19 ENCOUNTER — Ambulatory Visit
Admission: RE | Admit: 2016-11-19 | Discharge: 2016-11-19 | Disposition: A | Discharge status: Home / Self Care | Payer: Medicare Other | Source: Ambulatory Visit | Attending: Internal Medicine | Admitting: Internal Medicine

## 2016-11-19 DIAGNOSIS — M85851 Other specified disorders of bone density and structure, right thigh: Secondary | ICD-10-CM | POA: Diagnosis not present

## 2016-11-19 DIAGNOSIS — M810 Age-related osteoporosis without current pathological fracture: Secondary | ICD-10-CM

## 2016-11-19 DIAGNOSIS — Z1231 Encounter for screening mammogram for malignant neoplasm of breast: Secondary | ICD-10-CM | POA: Diagnosis not present

## 2016-11-19 DIAGNOSIS — Z780 Asymptomatic menopausal state: Secondary | ICD-10-CM | POA: Diagnosis not present

## 2016-11-21 DIAGNOSIS — M25561 Pain in right knee: Secondary | ICD-10-CM | POA: Diagnosis not present

## 2016-11-21 DIAGNOSIS — M1711 Unilateral primary osteoarthritis, right knee: Secondary | ICD-10-CM | POA: Diagnosis not present

## 2016-11-29 DIAGNOSIS — M1711 Unilateral primary osteoarthritis, right knee: Secondary | ICD-10-CM | POA: Diagnosis not present

## 2016-11-29 DIAGNOSIS — M25561 Pain in right knee: Secondary | ICD-10-CM | POA: Diagnosis not present

## 2016-12-10 DIAGNOSIS — I10 Essential (primary) hypertension: Secondary | ICD-10-CM | POA: Diagnosis not present

## 2016-12-12 DIAGNOSIS — I10 Essential (primary) hypertension: Secondary | ICD-10-CM | POA: Diagnosis not present

## 2016-12-12 DIAGNOSIS — I2510 Atherosclerotic heart disease of native coronary artery without angina pectoris: Secondary | ICD-10-CM | POA: Diagnosis not present

## 2016-12-12 DIAGNOSIS — I6523 Occlusion and stenosis of bilateral carotid arteries: Secondary | ICD-10-CM | POA: Diagnosis not present

## 2016-12-12 DIAGNOSIS — R05 Cough: Secondary | ICD-10-CM | POA: Diagnosis not present

## 2016-12-13 DIAGNOSIS — R739 Hyperglycemia, unspecified: Secondary | ICD-10-CM | POA: Diagnosis not present

## 2016-12-13 DIAGNOSIS — E7800 Pure hypercholesterolemia, unspecified: Secondary | ICD-10-CM | POA: Diagnosis not present

## 2016-12-13 DIAGNOSIS — E559 Vitamin D deficiency, unspecified: Secondary | ICD-10-CM | POA: Diagnosis not present

## 2016-12-13 DIAGNOSIS — I6529 Occlusion and stenosis of unspecified carotid artery: Secondary | ICD-10-CM | POA: Diagnosis not present

## 2016-12-13 DIAGNOSIS — I10 Essential (primary) hypertension: Secondary | ICD-10-CM | POA: Diagnosis not present

## 2017-03-14 DIAGNOSIS — R1030 Lower abdominal pain, unspecified: Secondary | ICD-10-CM | POA: Diagnosis not present

## 2017-03-14 DIAGNOSIS — N390 Urinary tract infection, site not specified: Secondary | ICD-10-CM | POA: Diagnosis not present

## 2017-03-27 DIAGNOSIS — M810 Age-related osteoporosis without current pathological fracture: Secondary | ICD-10-CM | POA: Diagnosis not present

## 2017-03-27 DIAGNOSIS — E559 Vitamin D deficiency, unspecified: Secondary | ICD-10-CM | POA: Diagnosis not present

## 2017-03-27 DIAGNOSIS — R5383 Other fatigue: Secondary | ICD-10-CM | POA: Diagnosis not present

## 2017-04-17 DIAGNOSIS — E559 Vitamin D deficiency, unspecified: Secondary | ICD-10-CM | POA: Diagnosis not present

## 2017-04-17 DIAGNOSIS — M810 Age-related osteoporosis without current pathological fracture: Secondary | ICD-10-CM | POA: Diagnosis not present

## 2017-06-14 DIAGNOSIS — R739 Hyperglycemia, unspecified: Secondary | ICD-10-CM | POA: Diagnosis not present

## 2017-06-14 DIAGNOSIS — I10 Essential (primary) hypertension: Secondary | ICD-10-CM | POA: Diagnosis not present

## 2017-06-14 DIAGNOSIS — E559 Vitamin D deficiency, unspecified: Secondary | ICD-10-CM | POA: Diagnosis not present

## 2017-06-20 DIAGNOSIS — E7800 Pure hypercholesterolemia, unspecified: Secondary | ICD-10-CM | POA: Diagnosis not present

## 2017-06-20 DIAGNOSIS — R251 Tremor, unspecified: Secondary | ICD-10-CM | POA: Diagnosis not present

## 2017-06-20 DIAGNOSIS — I10 Essential (primary) hypertension: Secondary | ICD-10-CM | POA: Diagnosis not present

## 2017-06-20 DIAGNOSIS — Z0000 Encounter for general adult medical examination without abnormal findings: Secondary | ICD-10-CM | POA: Diagnosis not present

## 2017-07-08 DIAGNOSIS — I10 Essential (primary) hypertension: Secondary | ICD-10-CM | POA: Diagnosis not present

## 2017-07-15 DIAGNOSIS — I10 Essential (primary) hypertension: Secondary | ICD-10-CM | POA: Diagnosis not present

## 2017-07-15 DIAGNOSIS — E7800 Pure hypercholesterolemia, unspecified: Secondary | ICD-10-CM | POA: Diagnosis not present

## 2017-07-15 DIAGNOSIS — R739 Hyperglycemia, unspecified: Secondary | ICD-10-CM | POA: Diagnosis not present

## 2017-07-15 DIAGNOSIS — G250 Essential tremor: Secondary | ICD-10-CM | POA: Diagnosis not present

## 2017-07-16 DIAGNOSIS — M25561 Pain in right knee: Secondary | ICD-10-CM | POA: Diagnosis not present

## 2017-07-16 DIAGNOSIS — M1711 Unilateral primary osteoarthritis, right knee: Secondary | ICD-10-CM | POA: Diagnosis not present

## 2017-07-23 DIAGNOSIS — M1711 Unilateral primary osteoarthritis, right knee: Secondary | ICD-10-CM | POA: Diagnosis not present

## 2017-07-23 DIAGNOSIS — M25561 Pain in right knee: Secondary | ICD-10-CM | POA: Diagnosis not present

## 2017-07-31 DIAGNOSIS — M25561 Pain in right knee: Secondary | ICD-10-CM | POA: Diagnosis not present

## 2017-07-31 DIAGNOSIS — M1711 Unilateral primary osteoarthritis, right knee: Secondary | ICD-10-CM | POA: Diagnosis not present

## 2017-08-14 DIAGNOSIS — M1711 Unilateral primary osteoarthritis, right knee: Secondary | ICD-10-CM | POA: Diagnosis not present

## 2017-08-14 DIAGNOSIS — M25561 Pain in right knee: Secondary | ICD-10-CM | POA: Diagnosis not present

## 2017-08-15 ENCOUNTER — Ambulatory Visit (INDEPENDENT_AMBULATORY_CARE_PROVIDER_SITE_OTHER): Payer: Medicare Other | Admitting: Vascular Surgery

## 2017-08-15 ENCOUNTER — Other Ambulatory Visit: Payer: Self-pay

## 2017-08-15 ENCOUNTER — Ambulatory Visit (HOSPITAL_COMMUNITY)
Admission: RE | Admit: 2017-08-15 | Discharge: 2017-08-15 | Disposition: A | Discharge status: Home / Self Care | Payer: Medicare Other | Source: Ambulatory Visit | Attending: Vascular Surgery | Admitting: Vascular Surgery

## 2017-08-15 ENCOUNTER — Encounter: Payer: Self-pay | Admitting: Vascular Surgery

## 2017-08-15 VITALS — BP 149/72 | HR 68 | Temp 97.5°F | Resp 20 | Ht 62.5 in | Wt 160.0 lb

## 2017-08-15 DIAGNOSIS — I6523 Occlusion and stenosis of bilateral carotid arteries: Secondary | ICD-10-CM | POA: Insufficient documentation

## 2017-08-15 DIAGNOSIS — I6521 Occlusion and stenosis of right carotid artery: Secondary | ICD-10-CM

## 2017-08-15 NOTE — Progress Notes (Signed)
Patient is an 84 year old female who returns for follow-up today.  She previously underwent right carotid endarterectomy July 2014.  She had restenosis and subsequently underwent stenting March 2015.  She denies any symptoms of TIA amaurosis or stroke.  She continues to remain on aspirin Plavix a statin.  Chronic medical problems remain coronary artery disease elevated cholesterol arthritis all of which are stable.  Review of systems: She denies shortness of breath.  She denies chest pain.  Past Medical History:  Diagnosis Date  . Arthritis   . Chronic cough   . Coronary artery disease   . Hypercholesteremia   . Internal carotid artery stenosis 08/2012  . Normal cardiac stress test 08/2012   @ Dr Irven Shelling office  . Peripheral vascular disease ([place])      Current Outpatient Medications on File Prior to Visit  Medication Sig Dispense Refill  . aspirin 81 MG tablet Take 81 mg by mouth daily.    . cholecalciferol (VITAMIN D) 1000 units tablet Take 2,000 Units by mouth daily.    . citalopram (CELEXA) 20 MG tablet Take 20 mg by mouth daily.     . clopidogrel (PLAVIX) 75 MG tablet Take 1 tablet (75 mg total) by mouth daily. 30 tablet 11  . ezetimibe (ZETIA) 10 MG tablet Take 5 mg by mouth daily.     Marland Kitchen losartan (COZAAR) 100 MG tablet Take 0.5 tablets (50 mg total) by mouth daily.    . metoprolol tartrate (LOPRESSOR) 25 MG tablet Take 1 tablet (25 mg total) by mouth 2 (two) times daily.    . Multiple Vitamins-Minerals (PRESERVISION AREDS PO) Take 2 tablets by mouth 2 (two) times daily.    . rosuvastatin (CRESTOR) 20 MG tablet Take 20 mg by mouth daily.     No current facility-administered medications on file prior to visit.    Social History   Socioeconomic History  . Marital status: Widowed    Spouse name: Not on file  . Number of children: Not on file  . Years of education: Not on file  . Highest education level: Not on file  Occupational History  . Not on file  Social Needs  .  Financial resource strain: Not on file  . Food insecurity:    Worry: Not on file    Inability: Not on file  . Transportation needs:    Medical: Not on file    Non-medical: Not on file  Tobacco Use  . Smoking status: Never Smoker  . Smokeless tobacco: Never Used  Substance and Sexual Activity  . Alcohol use: No  . Drug use: No  . Sexual activity: Never  Lifestyle  . Physical activity:    Days per week: Not on file    Minutes per session: Not on file  . Stress: Not on file  Relationships  . Social connections:    Talks on phone: Not on file    Gets together: Not on file    Attends religious service: Not on file    Active member of club or organization: Not on file    Attends meetings of clubs or organizations: Not on file    Relationship status: Not on file  . Intimate partner violence:    Fear of current or ex partner: Not on file    Emotionally abused: Not on file    Physically abused: Not on file    Forced sexual activity: Not on file  Other Topics Concern  . Not on file  Social History Narrative  .  Not on file    Family History  Problem Relation Age of Onset  . Cancer Father        Brain   . Diabetes Sister   . Heart disease Sister        after age 62  . Hyperlipidemia Sister   . Heart disease Brother        After age 79    Physical exam:  Vitals:   08/15/17 1352 08/15/17 1356  BP: (!) 174/76 (!) 149/72  Pulse: 68   Resp: 20   Temp: (!) 97.5 F (36.4 C)   TempSrc: Oral   SpO2: 94%   Weight: 160 lb (72.6 kg)   Height: 5' 2.5" (1.588 m)     Neck: No carotid bruits  Neuro: Symmetric upper extremity lower extremity motor strength which is 5/5.  Chest: Clear to auscultation bilaterally  Cardiac: Regular rate and rhythm  Data: Patient had a carotid duplex ultrasound today which showed no significant restenosis on the stented side and minimal stenosis on the left contralateral side.  Vertebral flow was antegrade bilaterally subclavian's were normal  as well.  Assessment: Doing well status post carotid endarterectomy with stenting after recurrent stenosis.  Plan: The patient will follow-up in 1 year with a repeat carotid duplex exam.  Ruta Hinds, MD [facility] [place] Office: [phone] Pager: [phone]

## 2017-08-27 DIAGNOSIS — M1711 Unilateral primary osteoarthritis, right knee: Secondary | ICD-10-CM | POA: Diagnosis not present

## 2017-08-27 DIAGNOSIS — M25561 Pain in right knee: Secondary | ICD-10-CM | POA: Diagnosis not present

## 2017-10-14 ENCOUNTER — Other Ambulatory Visit: Payer: Self-pay | Admitting: Internal Medicine

## 2017-10-14 DIAGNOSIS — Z1231 Encounter for screening mammogram for malignant neoplasm of breast: Secondary | ICD-10-CM

## 2017-10-18 DIAGNOSIS — E559 Vitamin D deficiency, unspecified: Secondary | ICD-10-CM | POA: Diagnosis not present

## 2017-10-18 DIAGNOSIS — R5383 Other fatigue: Secondary | ICD-10-CM | POA: Diagnosis not present

## 2017-10-18 DIAGNOSIS — M810 Age-related osteoporosis without current pathological fracture: Secondary | ICD-10-CM | POA: Diagnosis not present

## 2017-10-23 DIAGNOSIS — M810 Age-related osteoporosis without current pathological fracture: Secondary | ICD-10-CM | POA: Diagnosis not present

## 2017-10-23 DIAGNOSIS — E559 Vitamin D deficiency, unspecified: Secondary | ICD-10-CM | POA: Diagnosis not present

## 2017-11-19 ENCOUNTER — Encounter (INDEPENDENT_AMBULATORY_CARE_PROVIDER_SITE_OTHER): Payer: Medicare Other | Admitting: Ophthalmology

## 2017-11-21 ENCOUNTER — Ambulatory Visit: Payer: Medicare Other

## 2017-11-27 ENCOUNTER — Encounter (INDEPENDENT_AMBULATORY_CARE_PROVIDER_SITE_OTHER): Payer: Medicare Other | Admitting: Ophthalmology

## 2017-11-27 DIAGNOSIS — H353121 Nonexudative age-related macular degeneration, left eye, early dry stage: Secondary | ICD-10-CM | POA: Diagnosis not present

## 2017-11-27 DIAGNOSIS — I10 Essential (primary) hypertension: Secondary | ICD-10-CM

## 2017-11-27 DIAGNOSIS — H35033 Hypertensive retinopathy, bilateral: Secondary | ICD-10-CM | POA: Diagnosis not present

## 2017-11-27 DIAGNOSIS — H353112 Nonexudative age-related macular degeneration, right eye, intermediate dry stage: Secondary | ICD-10-CM | POA: Diagnosis not present

## 2017-11-27 DIAGNOSIS — H43813 Vitreous degeneration, bilateral: Secondary | ICD-10-CM | POA: Diagnosis not present

## 2017-12-03 DIAGNOSIS — M1711 Unilateral primary osteoarthritis, right knee: Secondary | ICD-10-CM | POA: Diagnosis not present

## 2017-12-03 DIAGNOSIS — M25561 Pain in right knee: Secondary | ICD-10-CM | POA: Diagnosis not present

## 2017-12-12 DIAGNOSIS — I2510 Atherosclerotic heart disease of native coronary artery without angina pectoris: Secondary | ICD-10-CM | POA: Diagnosis not present

## 2017-12-12 DIAGNOSIS — I10 Essential (primary) hypertension: Secondary | ICD-10-CM | POA: Diagnosis not present

## 2017-12-12 DIAGNOSIS — I6523 Occlusion and stenosis of bilateral carotid arteries: Secondary | ICD-10-CM | POA: Diagnosis not present

## 2017-12-12 DIAGNOSIS — E782 Mixed hyperlipidemia: Secondary | ICD-10-CM | POA: Diagnosis not present

## 2017-12-25 DIAGNOSIS — Z961 Presence of intraocular lens: Secondary | ICD-10-CM | POA: Diagnosis not present

## 2017-12-25 DIAGNOSIS — H43813 Vitreous degeneration, bilateral: Secondary | ICD-10-CM | POA: Diagnosis not present

## 2017-12-25 DIAGNOSIS — H40013 Open angle with borderline findings, low risk, bilateral: Secondary | ICD-10-CM | POA: Diagnosis not present

## 2017-12-27 ENCOUNTER — Ambulatory Visit: Payer: Medicare Other

## 2017-12-27 ENCOUNTER — Ambulatory Visit
Admission: RE | Admit: 2017-12-27 | Discharge: 2017-12-27 | Disposition: A | Discharge status: Home / Self Care | Payer: Medicare Other | Source: Ambulatory Visit | Attending: Internal Medicine | Admitting: Internal Medicine

## 2017-12-27 DIAGNOSIS — Z1231 Encounter for screening mammogram for malignant neoplasm of breast: Secondary | ICD-10-CM

## 2018-01-07 DIAGNOSIS — R739 Hyperglycemia, unspecified: Secondary | ICD-10-CM | POA: Diagnosis not present

## 2018-01-07 DIAGNOSIS — I10 Essential (primary) hypertension: Secondary | ICD-10-CM | POA: Diagnosis not present

## 2018-01-14 DIAGNOSIS — E7800 Pure hypercholesterolemia, unspecified: Secondary | ICD-10-CM | POA: Diagnosis not present

## 2018-01-14 DIAGNOSIS — I10 Essential (primary) hypertension: Secondary | ICD-10-CM | POA: Diagnosis not present

## 2018-01-14 DIAGNOSIS — G250 Essential tremor: Secondary | ICD-10-CM | POA: Diagnosis not present

## 2018-03-19 DIAGNOSIS — R0602 Shortness of breath: Secondary | ICD-10-CM | POA: Diagnosis not present

## 2018-03-19 DIAGNOSIS — R5383 Other fatigue: Secondary | ICD-10-CM | POA: Diagnosis not present

## 2018-03-19 DIAGNOSIS — R0609 Other forms of dyspnea: Secondary | ICD-10-CM | POA: Diagnosis not present

## 2018-03-19 DIAGNOSIS — R062 Wheezing: Secondary | ICD-10-CM | POA: Diagnosis not present

## 2018-03-19 DIAGNOSIS — J45909 Unspecified asthma, uncomplicated: Secondary | ICD-10-CM | POA: Diagnosis not present

## 2018-03-19 DIAGNOSIS — I2510 Atherosclerotic heart disease of native coronary artery without angina pectoris: Secondary | ICD-10-CM | POA: Diagnosis not present

## 2018-03-21 ENCOUNTER — Observation Stay (HOSPITAL_COMMUNITY)
Admission: EM | Admit: 2018-03-21 | Discharge: 2018-03-23 | Disposition: A | Discharge status: Home / Self Care | Payer: Medicare Other | Attending: Internal Medicine | Admitting: Internal Medicine

## 2018-03-21 ENCOUNTER — Encounter (HOSPITAL_COMMUNITY): Payer: Self-pay

## 2018-03-21 ENCOUNTER — Other Ambulatory Visit: Payer: Self-pay

## 2018-03-21 ENCOUNTER — Emergency Department (HOSPITAL_COMMUNITY): Payer: Medicare Other

## 2018-03-21 DIAGNOSIS — I700 Atherosclerosis of aorta: Secondary | ICD-10-CM | POA: Insufficient documentation

## 2018-03-21 DIAGNOSIS — Z951 Presence of aortocoronary bypass graft: Secondary | ICD-10-CM | POA: Diagnosis not present

## 2018-03-21 DIAGNOSIS — I739 Peripheral vascular disease, unspecified: Secondary | ICD-10-CM | POA: Diagnosis not present

## 2018-03-21 DIAGNOSIS — Z79899 Other long term (current) drug therapy: Secondary | ICD-10-CM | POA: Insufficient documentation

## 2018-03-21 DIAGNOSIS — I10 Essential (primary) hypertension: Secondary | ICD-10-CM | POA: Diagnosis not present

## 2018-03-21 DIAGNOSIS — R0902 Hypoxemia: Secondary | ICD-10-CM

## 2018-03-21 DIAGNOSIS — E7800 Pure hypercholesterolemia, unspecified: Secondary | ICD-10-CM | POA: Diagnosis not present

## 2018-03-21 DIAGNOSIS — E875 Hyperkalemia: Secondary | ICD-10-CM | POA: Diagnosis not present

## 2018-03-21 DIAGNOSIS — E785 Hyperlipidemia, unspecified: Secondary | ICD-10-CM | POA: Insufficient documentation

## 2018-03-21 DIAGNOSIS — Z8249 Family history of ischemic heart disease and other diseases of the circulatory system: Secondary | ICD-10-CM | POA: Diagnosis not present

## 2018-03-21 DIAGNOSIS — R918 Other nonspecific abnormal finding of lung field: Secondary | ICD-10-CM | POA: Diagnosis not present

## 2018-03-21 DIAGNOSIS — R05 Cough: Secondary | ICD-10-CM | POA: Diagnosis not present

## 2018-03-21 DIAGNOSIS — J9601 Acute respiratory failure with hypoxia: Principal | ICD-10-CM | POA: Diagnosis present

## 2018-03-21 DIAGNOSIS — F329 Major depressive disorder, single episode, unspecified: Secondary | ICD-10-CM | POA: Diagnosis not present

## 2018-03-21 DIAGNOSIS — Z7982 Long term (current) use of aspirin: Secondary | ICD-10-CM | POA: Diagnosis not present

## 2018-03-21 DIAGNOSIS — R0609 Other forms of dyspnea: Secondary | ICD-10-CM | POA: Diagnosis not present

## 2018-03-21 DIAGNOSIS — F32A Depression, unspecified: Secondary | ICD-10-CM

## 2018-03-21 DIAGNOSIS — M1990 Unspecified osteoarthritis, unspecified site: Secondary | ICD-10-CM | POA: Diagnosis not present

## 2018-03-21 DIAGNOSIS — I2510 Atherosclerotic heart disease of native coronary artery without angina pectoris: Secondary | ICD-10-CM | POA: Diagnosis not present

## 2018-03-21 DIAGNOSIS — R0602 Shortness of breath: Secondary | ICD-10-CM | POA: Diagnosis not present

## 2018-03-21 LAB — POCT I-STAT 7, (LYTES, BLD GAS, ICA,H+H)
Acid-Base Excess: 5 mmol/L — ABNORMAL HIGH (ref 0.0–2.0)
Bicarbonate: 31.2 mmol/L — ABNORMAL HIGH (ref 20.0–28.0)
Calcium, Ion: 1.25 mmol/L (ref 1.15–1.40)
HCT: 36 % (ref 36.0–46.0)
Hemoglobin: 12.2 g/dL (ref 12.0–15.0)
O2 Saturation: 92 %
Patient temperature: 98
Potassium: 4.1 mmol/L (ref 3.5–5.1)
Sodium: 135 mmol/L (ref 135–145)
TCO2: 33 mmol/L — ABNORMAL HIGH (ref 22–32)
pCO2 arterial: 49.7 mmHg — ABNORMAL HIGH (ref 32.0–48.0)
pH, Arterial: 7.404 (ref 7.350–7.450)
pO2, Arterial: 65 mmHg — ABNORMAL LOW (ref 83.0–108.0)

## 2018-03-21 LAB — COMPREHENSIVE METABOLIC PANEL
ALT: 16 U/L (ref 0–44)
AST: 20 U/L (ref 15–41)
Albumin: 3.5 g/dL (ref 3.5–5.0)
Alkaline Phosphatase: 49 U/L (ref 38–126)
Anion gap: 10 (ref 5–15)
BUN: 13 mg/dL (ref 8–23)
CO2: 28 mmol/L (ref 22–32)
Calcium: 9.7 mg/dL (ref 8.9–10.3)
Chloride: 98 mmol/L (ref 98–111)
Creatinine, Ser: 0.82 mg/dL (ref 0.44–1.00)
GFR calc Af Amer: >60 mL/min (ref >60)
GFR calc non Af Amer: >60 mL/min (ref >60)
Glucose, Bld: 82 mg/dL (ref 70–99)
Potassium: 4.4 mmol/L (ref 3.5–5.1)
Sodium: 136 mmol/L (ref 135–145)
Total Bilirubin: 0.8 mg/dL (ref 0.3–1.2)
Total Protein: 7.3 g/dL (ref 6.5–8.1)

## 2018-03-21 LAB — CBC WITH DIFFERENTIAL/PLATELET
Abs Immature Granulocytes: 0.01 10*3/uL (ref 0.00–0.07)
Basophils Absolute: 0 10*3/uL (ref 0.0–0.1)
Basophils Relative: 0 %
Eosinophils Absolute: 0.1 10*3/uL (ref 0.0–0.5)
Eosinophils Relative: 1 %
HCT: 42 % (ref 36.0–46.0)
Hemoglobin: 13.6 g/dL (ref 12.0–15.0)
Immature Granulocytes: 0 %
Lymphocytes Relative: 31 %
Lymphs Abs: 1.9 10*3/uL (ref 0.7–4.0)
MCH: 31.5 pg (ref 26.0–34.0)
MCHC: 32.4 g/dL (ref 30.0–36.0)
MCV: 97.2 fL (ref 80.0–100.0)
Monocytes Absolute: 0.7 10*3/uL (ref 0.1–1.0)
Monocytes Relative: 13 %
Neutro Abs: 3.2 10*3/uL (ref 1.7–7.7)
Neutrophils Relative %: 55 %
Platelets: 303 10*3/uL (ref 150–400)
RBC: 4.32 MIL/uL (ref 3.87–5.11)
RDW: 11.4 % — ABNORMAL LOW (ref 11.5–15.5)
WBC: 5.9 10*3/uL (ref 4.0–10.5)
nRBC: 0 % (ref 0.0–0.2)

## 2018-03-21 LAB — BRAIN NATRIURETIC PEPTIDE: B Natriuretic Peptide: 135.5 pg/mL — ABNORMAL HIGH (ref 0.0–100.0)

## 2018-03-21 LAB — D-DIMER, QUANTITATIVE: D-Dimer, Quant: 1.04 ug/mL-FEU — ABNORMAL HIGH (ref 0.00–0.50)

## 2018-03-21 LAB — TROPONIN I: Troponin I: <0.03 ng/mL (ref <0.03)

## 2018-03-21 MED ORDER — ACETAMINOPHEN 650 MG RE SUPP: 650.0000 mg | Freq: Four times a day (QID) | RECTAL | Status: DC | PRN

## 2018-03-21 MED ORDER — ROSUVASTATIN CALCIUM 20 MG PO TABS
20.0000 mg | ORAL_TABLET | ORAL | Status: DC
  Administered 2018-03-22: 20 mg via ORAL
  Filled 2018-03-21 (×2): qty 1

## 2018-03-21 MED ORDER — METOPROLOL TARTRATE 25 MG PO TABS
25.0000 mg | ORAL_TABLET | Freq: Two times a day (BID) | ORAL | Status: DC
  Administered 2018-03-21 – 2018-03-23 (×3): 25 mg via ORAL
  Filled 2018-03-21 (×4): qty 1

## 2018-03-21 MED ORDER — ACETAMINOPHEN 325 MG PO TABS: 650.0000 mg | ORAL_TABLET | Freq: Four times a day (QID) | ORAL | Status: DC | PRN

## 2018-03-21 MED ORDER — ENOXAPARIN SODIUM 40 MG/0.4ML ~~LOC~~ SOLN
40.0000 mg | SUBCUTANEOUS | Status: DC
  Administered 2018-03-21 – 2018-03-22 (×2): 40 mg via SUBCUTANEOUS
  Filled 2018-03-21 (×2): qty 0.4

## 2018-03-21 MED ORDER — CITALOPRAM HYDROBROMIDE 20 MG PO TABS
20.0000 mg | ORAL_TABLET | Freq: Every day | ORAL | Status: DC
  Administered 2018-03-22 – 2018-03-23 (×2): 20 mg via ORAL
  Filled 2018-03-21 (×2): qty 1

## 2018-03-21 MED ORDER — DOXYCYCLINE HYCLATE 100 MG PO TABS
100.0000 mg | ORAL_TABLET | Freq: Two times a day (BID) | ORAL | Status: DC
  Administered 2018-03-21 – 2018-03-23 (×4): 100 mg via ORAL
  Filled 2018-03-21 (×4): qty 1

## 2018-03-21 MED ORDER — IOPAMIDOL (ISOVUE-370) INJECTION 76%
75.0000 mL | Freq: Once | INTRAVENOUS | Status: AC | PRN
  Administered 2018-03-21: 75 mL via INTRAVENOUS

## 2018-03-21 MED ORDER — IPRATROPIUM-ALBUTEROL 0.5-2.5 (3) MG/3ML IN SOLN
3.0000 mL | Freq: Once | RESPIRATORY_TRACT | Status: AC
Start: 2018-03-21 — End: 2018-03-21
  Administered 2018-03-21: 3 mL via RESPIRATORY_TRACT
  Filled 2018-03-21: qty 3

## 2018-03-21 MED ORDER — ONDANSETRON HCL 4 MG PO TABS: 4.0000 mg | ORAL_TABLET | Freq: Four times a day (QID) | ORAL | Status: DC | PRN

## 2018-03-21 MED ORDER — EZETIMIBE 10 MG PO TABS
10.0000 mg | ORAL_TABLET | ORAL | Status: DC
  Administered 2018-03-21 – 2018-03-23 (×2): 10 mg via ORAL
  Filled 2018-03-21 (×2): qty 1

## 2018-03-21 MED ORDER — ROSUVASTATIN CALCIUM 20 MG PO TABS: 20.0000 mg | ORAL_TABLET | ORAL | Status: DC

## 2018-03-21 MED ORDER — ONDANSETRON HCL 4 MG/2ML IJ SOLN: 4.0000 mg | Freq: Four times a day (QID) | INTRAMUSCULAR | Status: DC | PRN

## 2018-03-21 MED ORDER — LOSARTAN POTASSIUM 50 MG PO TABS
100.0000 mg | ORAL_TABLET | Freq: Every day | ORAL | Status: DC
  Administered 2018-03-22: 100 mg via ORAL
  Filled 2018-03-21: qty 2

## 2018-03-21 MED ORDER — SODIUM CHLORIDE 0.9 % IV BOLUS
500.0000 mL | Freq: Once | INTRAVENOUS | Status: AC
  Administered 2018-03-21: 500 mL via INTRAVENOUS

## 2018-03-21 MED ORDER — ASPIRIN 81 MG PO CHEW
81.0000 mg | CHEWABLE_TABLET | Freq: Every day | ORAL | Status: DC
  Administered 2018-03-22 – 2018-03-23 (×2): 81 mg via ORAL
  Filled 2018-03-21 (×2): qty 1

## 2018-03-21 NOTE — ED Notes (Signed)
Patient transported to CT 

## 2018-03-21 NOTE — Progress Notes (Signed)
PIV consult: Site established by ED RN. 

## 2018-03-21 NOTE — ED Notes (Signed)
Pt ambulated to restroom. 

## 2018-03-21 NOTE — ED Provider Notes (Signed)
Patient care accepted from Dr. Eulis Foster.  Patient sent in from PMD Dr. Shelia Media with hypoxia.  Patient with bronchitis symptoms for several days and on cefuroxine 2 days ago.  Hypoxic on recheck and sent here.  Sats 85% on room air.  No fever.  CTA pending and will need admission.  5:37 PM CTA without pe but multiple ground glass opacities Discussed with hospitalist and will see for admission   Pattricia Boss, MD 03/22/18 0007

## 2018-03-21 NOTE — H&P (Signed)
History and Physical    [name] PFX:[id number] DOB: [birth year] DOA: 03/21/2018  PCP: Adrienne Gravel, MD  Patient coming from: Home  Chief Complaint: Shortness of breath  HPI: [name] is a 85 y.o. female with medical history significant of coronary artery disease status post CABG, hyperlipidemia who presents with shortness of breath.  She was diagnosed with bronchitis, and at her follow-up appointment, she was found to be hypoxic on room air in the 80s.  She denies any fevers, chills, muscle aches, flulike symptoms, chest pain or chest tightness, nausea, vomiting, diarrhea or abdominal pain.  She denies any previous smoking history.  She admits to productive cough of clear sputum.  ED Course: Labs largely unremarkable, negative troponin, no leukocytosis.  Today without any acute abnormalities.  CTA chest negative for PE, did find numerous scattered bilateral groundglass and small nodular pulmonary opacities, diffuse mosaic attenuation of airspaces and mild bilateral bronchial wall thickening.  She was given a breathing treatment, referred for admission because of ongoing nasal cannula oxygen requirement.  Review of Systems: As per HPI otherwise 10 point review of systems negative.   Past Medical History:  Diagnosis Date  . Arthritis   . Chronic cough   . Coronary artery disease   . Hypercholesteremia   . Internal carotid artery stenosis 08/2012  . Normal cardiac stress test 08/2012   @ Dr Irven Shelling office  . Peripheral vascular disease [facility])     Past Surgical History:  Procedure Laterality Date  . ABDOMINAL HYSTERECTOMY    . APPENDECTOMY    . CAROTID ANGIOGRAM N/A 04/01/2013   Procedure: CAROTID ANGIOGRAM;  Surgeon: Elam Dutch, MD;  Location: [facility] CATH LAB;  Service: Cardiovascular;  Laterality: N/A;  . CAROTID ENDARTERECTOMY Right August 25, 2012   cea  . CAROTID STENT INSERTION Right 04/22/2013   Procedure: CAROTID STENT INSERTION;  Surgeon:  Elam Dutch, MD;  Location: [facility] CATH LAB;  Service: Cardiovascular;  Laterality: Right;  . CORONARY ARTERY BYPASS GRAFT    . ENDARTERECTOMY Right 08/25/2012   Procedure: ENDARTERECTOMY CAROTID;  Surgeon: Elam Dutch, MD;  Location: [place];  Service: Vascular;  Laterality: Right;  . EYE SURGERY     both cataracts  . FRACTURE SURGERY Left Jun 14, 2011   [place]kle  . ORIF ANKLE FRACTURE  06/14/2011   Procedure: OPEN REDUCTION INTERNAL FIXATION (ORIF) ANKLE FRACTURE;  Surgeon: Ninetta Lights, MD;  Location: [place];  Service: Orthopedics;  Laterality: Left;  left ankle fracture open treatment trimalleolar ankle includes internal fixation WITHOUT fixation of posterior lip  . SHOULDER ARTHROSCOPY WITH SUBACROMIAL DECOMPRESSION, ROTATOR CUFF REPAIR AND BICEP TENDON REPAIR Left 06/05/2012   Procedure: LEFT SHOULDER ARTHROSCOPY WITH SUBACROMIAL DECOMPRESSION, PARTIAL ACROMIOPLASTY WITH CORACOACROMIAL RELEASE, DISTAL CLAVICULECTOMY, WITH ROTATOR CUFF REPAIR AND BICEP TENODESIS;  Surgeon: Ninetta Lights, MD;  Location: [place];  Service: Orthopedics;  Laterality: Left;     reports that she has never smoked. She has never used smokeless tobacco. She reports that she does not drink alcohol or use drugs.  Allergies  Allergen Reactions  . Hydrocodone Shortness Of Breath    Rash-itching-tongue swelled  . Fenofibrate Other (See Comments)    Caused every joint to become stif  . Oxycodone Swelling    Rash-itching  . Latex Rash and Other (See Comments)    "Blisters in mouth"    Family History  Problem Relation Age of Onset  . Cancer Father  Brain   . Diabetes Sister   . Heart disease Sister        after age 41  . Hyperlipidemia Sister   . Heart disease Brother        After age 81    Prior to Admission medications   Medication Sig Start Date End Date Taking? Authorizing Provider  aspirin 81 MG tablet Take 81 mg by mouth daily.   Yes [provider]  Calcium 600-200 MG-UNIT tablet Take 2 tablets by mouth daily.   Yes [provider]  Cholecalciferol (VITAMIN D3 PO) Take 3 capsules by mouth daily.   Yes [provider]  citalopram (CELEXA) 20 MG tablet Take 20 mg by mouth daily.  05/01/13  Yes [provider]  denosumab (PROLIA) 60 MG/ML SOSY injection Inject 60 mg into the skin every 6 (six) months. August 2019   Yes [provider]  ezetimibe (ZETIA) 10 MG tablet Take 10 mg by mouth every other day.    Yes [provider]  losartan (COZAAR) 100 MG tablet Take 0.5 tablets (50 mg total) by mouth daily. Patient taking differently: Take 100 mg by mouth daily.  08/29/12  Yes Rhyne, Samantha J, PA-C  metoprolol tartrate (LOPRESSOR) 25 MG tablet Take 1 tablet (25 mg total) by mouth 2 (two) times daily. 08/29/12  Yes Rhyne, Hulen Shouts, PA-C  Multiple Vitamins-Minerals (PRESERVISION AREDS PO) Take 2 tablets by mouth 2 (two) times daily.   Yes [provider]  rosuvastatin (CRESTOR) 20 MG tablet Take 20 mg by mouth every other day.    Yes [provider]  clopidogrel (PLAVIX) 75 MG tablet Take 1 tablet (75 mg total) by mouth daily. Patient not taking: Reported on 03/21/2018 04/01/13   Elam Dutch, MD    Physical Exam: Vitals:   03/21/18 1542 03/21/18 1545 03/21/18 1600 03/21/18 1636  BP:  (!) 124/45 119/72   Pulse:  (!) 55 65   Resp: 19  20   Temp:    (!) 97.4 F (36.3 C)  TempSrc:    Oral  SpO2:  96% 92%   Weight:      Height:         Constitutional: NAD, calm, comfortable Eyes: PERRL, lids and conjunctivae normal ENMT: Mucous membranes are moist. Posterior pharynx clear of any exudate or lesions.Normal dentition.  Neck: normal, supple, no masses, no thyromegaly Respiratory: clear to auscultation bilaterally, no wheezing. Normal respiratory effort. No accessory muscle use.  On nasal cannula O2 Cardiovascular: Regular rate and rhythm, no murmurs / rubs / gallops. No  extremity edema. Abdomen: no tenderness, no masses palpated. No hepatosplenomegaly. Bowel sounds positive.  Musculoskeletal: no clubbing / cyanosis. No joint deformity upper and lower extremities. Normal muscle tone.  Skin: no rashes, lesions, ulcers. No induration Neurologic: CN 2-12 grossly intact.  Strength 5/5 in all 4.  Psychiatric: Normal judgment and insight. Alert and oriented x 3. Normal mood.   Labs on Admission: I have personally reviewed following labs and imaging studies  CBC: Recent Labs  Lab 03/21/18 1212 03/21/18 1453  WBC 5.9  --   NEUTROABS 3.2  --   HGB 13.6 12.2  HCT 42.0 36.0  MCV 97.2  --   PLT 303  --    Basic Metabolic Panel: Recent Labs  Lab 03/21/18 1212 03/21/18 1453  NA 136 135  K 4.4 4.1  CL 98  --   CO2 28  --   GLUCOSE 82  --  BUN 13  --   CREATININE 0.82  --   CALCIUM 9.7  --    GFR: Estimated Creatinine Clearance: 46.9 mL/min (by C-G formula based on SCr of 0.82 mg/dL). Liver Function Tests: Recent Labs  Lab 03/21/18 1212  AST 20  ALT 16  ALKPHOS 49  BILITOT 0.8  PROT 7.3  ALBUMIN 3.5   No results for input(s): LIPASE, AMYLASE in the last 168 hours. No results for input(s): AMMONIA in the last 168 hours. Coagulation Profile: No results for input(s): INR, PROTIME in the last 168 hours. Cardiac Enzymes: Recent Labs  Lab 03/21/18 1212  TROPONINI <0.03   BNP (last 3 results) No results for input(s): PROBNP in the last 8760 hours. HbA1C: No results for input(s): HGBA1C in the last 72 hours. CBG: No results for input(s): GLUCAP in the last 168 hours. Lipid Profile: No results for input(s): CHOL, HDL, LDLCALC, TRIG, CHOLHDL, LDLDIRECT in the last 72 hours. Thyroid Function Tests: No results for input(s): TSH, T4TOTAL, FREET4, T3FREE, THYROIDAB in the last 72 hours. Anemia Panel: No results for input(s): VITAMINB12, FOLATE, FERRITIN, TIBC, IRON, RETICCTPCT in the last 72 hours. Urine analysis:    Component Value  Date/Time   COLORURINE YELLOW 07/05/2015 1919   APPEARANCEUR CLEAR 07/05/2015 1919   LABSPEC 1.023 07/05/2015 1919   PHURINE 6.0 07/05/2015 1919   GLUCOSEU NEGATIVE 07/05/2015 1919   HGBUR NEGATIVE 07/05/2015 [place] NEGATIVE 07/05/2015 1919   KETONESUR NEGATIVE 07/05/2015 1919   PROTEINUR NEGATIVE 07/05/2015 1919   UROBILINOGEN 0.2 08/22/2012 1245   NITRITE NEGATIVE 07/05/2015 1919   LEUKOCYTESUR SMALL (A) 07/05/2015 1919   Sepsis Labs: !!!!!!!!!!!!!!!!!!!!!!!!!!!!!!!!!!!!!!!!!!!! @LABRCNTIP (procalcitonin:4,lacticidven:4) )No results found for this or any previous visit (from the past 240 hour(s)).   Radiological Exams on Admission: Ct Angio Chest Pe W/cm &/or Wo Cm  Result Date: 03/21/2018 CLINICAL DATA:  Hypoxia, unresolved bronchitis EXAM: CT ANGIOGRAPHY CHEST WITH CONTRAST TECHNIQUE: Multidetector CT imaging of the chest was performed using the standard protocol during bolus administration of intravenous contrast. Multiplanar CT image reconstructions and MIPs were obtained to evaluate the vascular anatomy. CONTRAST:  72mL ISOVUE-370 IOPAMIDOL (ISOVUE-370) INJECTION 76% COMPARISON:  Chest radiograph, 03/11/2018 FINDINGS: Cardiovascular: Satisfactory opacification of the pulmonary arteries to the segmental level. No evidence of pulmonary embolism. Status post median sternotomy and CABG. Three-vessel coronary artery calcifications and/or stents. Normal heart size. No pericardial effusion. Aortic atherosclerosis. Mediastinum/Nodes: No enlarged mediastinal, hilar, or axillary lymph nodes. Thyroid gland, trachea, and esophagus demonstrate no significant findings. Lungs/Pleura: There are numerous scattered bilateral ground-glass and small nodular pulmonary opacities. Diffuse mosaic attenuation of the airspaces and mild bilateral bronchial wall thickening. No pleural effusion or pneumothorax. Upper Abdomen: No acute abnormality. Musculoskeletal: No chest wall abnormality. No acute or  significant osseous findings. Review of the MIP images confirms the above findings. IMPRESSION: 1.  Negative examination for pulmonary embolism. 2. There are numerous scattered bilateral ground-glass and small nodular pulmonary opacities. Diffuse mosaic attenuation of the airspaces and mild bilateral bronchial wall thickening. Findings are nonspecific and infectious or inflammatory. Given the presence of nodularity, recommend follow-up CT examination at 3 months to evaluate for resolution. 3.  Coronary artery disease. Electronically Signed   By: Eddie Candle M.D.   On: 03/21/2018 16:47   Dg Chest Port 1 View  Result Date: 03/21/2018 CLINICAL DATA:  Shortness of breath.  Hypoxia.  Cough. EXAM: PORTABLE CHEST 1 VIEW COMPARISON:  03/19/2018 and 08/22/2012 FINDINGS: The heart size and pulmonary vascularity are normal. Aortic atherosclerosis.  CABG. No infiltrates or effusions. Minimal chronic accentuation of the interstitial markings at the left lung base laterally. No acute bone abnormality. IMPRESSION: No acute abnormalities. Aortic Atherosclerosis (ICD10-I70.0). Electronically Signed   By: Lorriane Shire M.D.   On: 03/21/2018 12:38    EKG: Independently reviewed.  Sinus bradycardia, rate 53, no ST changes, normal axis  Assessment/Plan Principal Problem:   Acute hypoxemic respiratory failure (HCC) Active Problems:   CAD (coronary artery disease)   HTN (hypertension)   Depression   Acute hypoxemic respiratory failure CTA chest negative for PE, however did find bronchial wall thickening Will treat for bronchitis with doxycycline Continue nasal cannula O2, wean as able  CAD Continue aspirin, Crestor  Essential hypertension Continue Lopressor, Cozaar  Depression Continue Celexa   DVT prophylaxis: Lovenox Code Status: Full code Family Communication: Son at bedside Disposition Plan: Pending clinical improvement Consults called: None Admission status: Observation  Severity of  Illness: The appropriate patient status for this patient is OBSERVATION. Observation status is judged to be reasonable and necessary in order to provide the required intensity of service to ensure the patient's safety. The patient's presenting symptoms, physical exam findings, and initial radiographic and laboratory data in the context of their medical condition is felt to place them at decreased risk for further clinical deterioration. Furthermore, it is anticipated that the patient will be medically stable for discharge from the hospital within 2 midnights of admission. The following factors support the patient status of observation.   " The patient's presenting symptoms include shortness of breath with exertion, productive cough. " The initial radiographic and laboratory data showed bronchial wall thickening, SPO2 86 on room air.    Dessa Phi, DO [facility] 03/21/2018, 5:41 PM

## 2018-03-21 NOTE — ED Provider Notes (Signed)
[facility] EMERGENCY DEPARTMENT Provider Note   CSN: [id number] Arrival date & time: 03/21/18  1114     History   Chief Complaint No chief complaint on file.   HPI [name] is a 85 y.o. female.  HPI   Patient here for evaluation of hypoxia and ongoing shortness of breath, found at follow-up visit for bronchitis, with her PCP.  She was hypoxic 85% and therefore sent here for further evaluation and treatment.  She had been treated 2 days ago with cefuroxime, as treatment for bronchitis.  Patient denies chest pain, nausea, vomiting, fever or chills.  She reports 20 pound weight loss over the last several months.  She has ongoing cough for "20 years."  The cough is occasionally productive of "clear sputum."  She is here with her son.  She refused EMS transfer from her doctor's office and came by private vehicle.  She reports taking her prescribed medications.  There are no other known modifying factors.  Past Medical History:  Diagnosis Date  . Arthritis   . Chronic cough   . Coronary artery disease   . Hypercholesteremia   . Internal carotid artery stenosis 08/2012  . Normal cardiac stress test 08/2012   @ Dr Irven Shelling office  . Peripheral vascular disease [facility])     Patient Active Problem List   Diagnosis Date Noted  . Carotid stenosis 11/06/2013  . Aftercare following surgery of the circulatory system 11/06/2013  . Asymptomatic stenosis of right carotid artery without infarction 04/22/2013  . Aftercare following surgery of the circulatory system, [place] 09/04/2012  . Occlusion and stenosis of carotid artery without mention of cerebral infarction 08/14/2012    Past Surgical History:  Procedure Laterality Date  . ABDOMINAL HYSTERECTOMY    . APPENDECTOMY    . CAROTID ANGIOGRAM N/A 04/01/2013   Procedure: CAROTID ANGIOGRAM;  Surgeon: Elam Dutch, MD;  Location: [facility] CATH LAB;  Service: Cardiovascular;  Laterality: N/A;  . CAROTID  ENDARTERECTOMY Right August 25, 2012   cea  . CAROTID STENT INSERTION Right 04/22/2013   Procedure: CAROTID STENT INSERTION;  Surgeon: Elam Dutch, MD;  Location: [facility] CATH LAB;  Service: Cardiovascular;  Laterality: Right;  . CORONARY ARTERY BYPASS GRAFT    . ENDARTERECTOMY Right 08/25/2012   Procedure: ENDARTERECTOMY CAROTID;  Surgeon: Elam Dutch, MD;  Location: [place];  Service: Vascular;  Laterality: Right;  . EYE SURGERY     both cataracts  . FRACTURE SURGERY Left Jun 14, 2011   [place]  . ORIF ANKLE FRACTURE  06/14/2011   Procedure: OPEN REDUCTION INTERNAL FIXATION (ORIF) ANKLE FRACTURE;  Surgeon: Ninetta Lights, MD;  Location: [place];  Service: Orthopedics;  Laterality: Left;  left ankle fracture open treatment trimalleolar ankle includes internal fixation WITHOUT fixation of posterior lip  . SHOULDER ARTHROSCOPY WITH SUBACROMIAL DECOMPRESSION, ROTATOR CUFF REPAIR AND BICEP TENDON REPAIR Left 06/05/2012   Procedure: LEFT SHOULDER ARTHROSCOPY WITH SUBACROMIAL DECOMPRESSION, PARTIAL ACROMIOPLASTY WITH CORACOACROMIAL RELEASE, DISTAL CLAVICULECTOMY, WITH ROTATOR CUFF REPAIR AND BICEP TENODESIS;  Surgeon: Ninetta Lights, MD;  Location: [place];  Service: Orthopedics;  Laterality: Left;     OB History   No obstetric history on file.      Home Medications    Prior to Admission medications   Medication Sig Start Date End Date Taking? Authorizing Provider  aspirin 81 MG tablet Take 81 mg by mouth daily.    [provider]  cholecalciferol (VITAMIN D)  1000 units tablet Take 2,000 Units by mouth daily.    [provider]  citalopram (CELEXA) 20 MG tablet Take 20 mg by mouth daily.  05/01/13   [provider]  clopidogrel (PLAVIX) 75 MG tablet Take 1 tablet (75 mg total) by mouth daily. 04/01/13   Elam Dutch, MD  ezetimibe (ZETIA) 10 MG tablet Take 5 mg by mouth daily.     [provider]  losartan (COZAAR) 100 MG  tablet Take 0.5 tablets (50 mg total) by mouth daily. 08/29/12   Rhyne, Hulen Shouts, PA-C  metoprolol tartrate (LOPRESSOR) 25 MG tablet Take 1 tablet (25 mg total) by mouth 2 (two) times daily. 08/29/12   Rhyne, Hulen Shouts, PA-C  Multiple Vitamins-Minerals (PRESERVISION AREDS PO) Take 2 tablets by mouth 2 (two) times daily.    [provider]  rosuvastatin (CRESTOR) 20 MG tablet Take 20 mg by mouth daily.    [provider]    Family History Family History  Problem Relation Age of Onset  . Cancer Father        Brain   . Diabetes Sister   . Heart disease Sister        after age 3  . Hyperlipidemia Sister   . Heart disease Brother        After age 31    Social History Social History   Tobacco Use  . Smoking status: Never Smoker  . Smokeless tobacco: Never Used  Substance Use Topics  . Alcohol use: No  . Drug use: No     Allergies   Hydrocodone; Fenofibrate; Oxycodone; and Latex   Review of Systems Review of Systems  All other systems reviewed and are negative.    Physical Exam Updated Vital Signs BP 119/72   Pulse 65   Temp (!) 97.4 F (36.3 C) (Oral)   Resp 20   Ht 5\' 2"  (1.575 m)   Wt 65.3 kg   SpO2 92%   BMI 26.34 kg/m   Physical Exam Vitals signs and nursing note reviewed.  Constitutional:      General: She is not in acute distress.    Appearance: Normal appearance. She is well-developed. She is not ill-appearing, toxic-appearing or diaphoretic.  HENT:     Head: Normocephalic and atraumatic.     Right Ear: External ear normal.     Left Ear: External ear normal.  Eyes:     Conjunctiva/sclera: Conjunctivae normal.     Pupils: Pupils are equal, round, and reactive to light.  Neck:     Musculoskeletal: Normal range of motion and neck supple.     Trachea: Phonation normal.  Cardiovascular:     Rate and Rhythm: Normal rate and regular rhythm.     Heart sounds: Normal heart sounds.  Pulmonary:     Effort: Pulmonary effort is normal.  No respiratory distress.     Breath sounds: Normal breath sounds. No stridor. No wheezing or rhonchi.  Abdominal:     Palpations: Abdomen is soft.     Tenderness: There is no abdominal tenderness.  Musculoskeletal: Normal range of motion.        General: No swelling or tenderness.     Right lower leg: No edema.     Left lower leg: No edema.  Skin:    General: Skin is warm and dry.  Neurological:     Mental Status: She is alert and oriented to person, place, and time.     Cranial Nerves: No cranial nerve  deficit.     Sensory: No sensory deficit.     Motor: No abnormal muscle tone.     Coordination: Coordination normal.  Psychiatric:        Mood and Affect: Mood normal.        Behavior: Behavior normal.      ED Treatments / Results  Labs (all labs ordered are listed, but only abnormal results are displayed) Labs Reviewed  CBC WITH DIFFERENTIAL/PLATELET - Abnormal; Notable for the following components:      Result Value   RDW 11.4 (*)    All other components within normal limits  D-DIMER, QUANTITATIVE (NOT AT [facility]) - Abnormal; Notable for the following components:   D-Dimer, Quant 1.04 (*)    All other components within normal limits  POCT I-STAT 7, (LYTES, BLD GAS, ICA,H+H) - Abnormal; Notable for the following components:   pCO2 arterial 49.7 (*)    pO2, Arterial 65.0 (*)    Bicarbonate 31.2 (*)    TCO2 33 (*)    Acid-Base Excess 5.0 (*)    All other components within normal limits  COMPREHENSIVE METABOLIC PANEL  TROPONIN I    EKG EKG Interpretation  Date/Time:  Friday March 21 2018 11:39:55 EST Ventricular Rate:  53 PR Interval:    QRS Duration: 87 QT Interval:  430 QTC Calculation: 404 R Axis:   67 Text Interpretation:  Sinus rhythm since last tracing no significant change Confirmed by Daleen Bo [phone]) on 03/21/2018 2:53:22 PM   Radiology Dg Chest Port 1 View  Result Date: 03/21/2018 CLINICAL DATA:  Shortness of breath.  Hypoxia.  Cough. EXAM:  PORTABLE CHEST 1 VIEW COMPARISON:  03/19/2018 and 08/22/2012 FINDINGS: The heart size and pulmonary vascularity are normal. Aortic atherosclerosis. CABG. No infiltrates or effusions. Minimal chronic accentuation of the interstitial markings at the left lung base laterally. No acute bone abnormality. IMPRESSION: No acute abnormalities. Aortic Atherosclerosis (ICD10-I70.0). Electronically Signed   By: Lorriane Shire M.D.   On: 03/21/2018 12:38    Procedures .Critical Care Performed by: Daleen Bo, MD Authorized by: Daleen Bo, MD   Critical care provider statement:    Critical care time (minutes):  35   Critical care start time:  03/21/2018 11:40 AM   Critical care end time:  03/21/2018 3:44 PM   Critical care time was exclusive of:  Separately billable procedures and treating other patients   Critical care was necessary to treat or prevent imminent or life-threatening deterioration of the following conditions:  Respiratory failure   Critical care was time spent personally by me on the following activities:  Blood draw for specimens, development of treatment plan with patient or surrogate, discussions with consultants, evaluation of patient's response to treatment, examination of patient, obtaining history from patient or surrogate, ordering and performing treatments and interventions, ordering and review of laboratory studies, pulse oximetry, re-evaluation of patient's condition, review of old charts and ordering and review of radiographic studies   (including critical care time)  Medications Ordered in ED Medications  sodium chloride 0.9 % bolus 500 mL (0 mLs Intravenous Stopped 03/21/18 1539)  ipratropium-albuterol (DUONEB) 0.5-2.5 (3) MG/3ML nebulizer solution 3 mL (3 mLs Nebulization Given 03/21/18 1546)  iopamidol (ISOVUE-370) 76 % injection 75 mL (75 mLs Intravenous Contrast Given 03/21/18 1609)     Initial Impression / Assessment and Plan / ED Course  I have reviewed the triage  vital signs and the nursing notes.  Pertinent labs & imaging results that were available during my care of  the patient were reviewed by me and considered in my medical decision making (see chart for details).  Clinical Course as of Mar 22 1635  Fri Mar 21, 2018  1142 Oxygen saturation on room air 86%.-Abnormal, low   [EW]  1148 Oxygen saturation normal on 2 L nasal cannula, 95%.  SpO2: 95 % [EW]  1520 Normal except PCO2 elevated, PO2 low, bicarb elevated, total CO2 elevated  I-STAT 7, (LYTES, BLD GAS, ICA, H+H)(!) [EW]  1521 Elevated  D-dimer, quantitative(!) [EW]  1521 Normal  CBC with Differential(!) [EW]  1521 Normal  Troponin I - Once [EW]  1521 Normal  Comprehensive metabolic panel [EW]  7425 No infiltrate or CHF, images reviewed by me  DG Chest Port 1 View [EW]    Clinical Course User Index [EW] Daleen Bo, MD     Patient Vitals for the past 24 hrs:  BP Temp Temp src Pulse Resp SpO2 Height Weight  03/21/18 1636 - (!) 97.4 F (36.3 C) Oral - - - - -  03/21/18 1600 119/72 - - 65 20 92 % - -  03/21/18 1545 (!) 124/45 - - (!) 55 - 96 % - -  03/21/18 1542 - - - - 19 - - -  03/21/18 1530 (!) 113/55 - - (!) 55 - 95 % - -  03/21/18 1528 - - - - (!) 26 - - -  03/21/18 1515 102/65 - - (!) 56 - 97 % - -  03/21/18 1513 - - - - (!) 22 - - -  03/21/18 1445 109/69 - - 61 19 90 % - -  03/21/18 1430 (!) 116/55 - - (!) 57 - 93 % - -  03/21/18 1429 - - - - 20 - - -  03/21/18 1415 97/60 - - (!) 58 18 95 % - -  03/21/18 1400 (!) 127/52 - - (!) 58 14 96 % - -  03/21/18 1347 (!) 81/49 - - (!) 58 (!) 21 95 % - -  03/21/18 1149 - - - - - - 5\' 2"  (1.575 m) 65.3 kg  03/21/18 1146 - - - - 16 - - -  03/21/18 1145 103/60 - - (!) 55 - 95 % - -  03/21/18 1142 - - - - - 95 % - -  03/21/18 1141 - - - - 20 - - -  03/21/18 1140 110/68 - - (!) 52 - 93 % - -  03/21/18 1139 110/68 98 F (36.7 C) Oral (!) 56 (!) 22 (!) 86 % - -    3:43 PM Reevaluation with update and discussion. After  initial assessment and treatment, an updated evaluation reveals she states she still feels comfortable and not "draggy," like she did earlier at her doctor's office.  CT pending.  Patient updated on findings and plan.Daleen Bo   Medical Decision Making: Shortness of breath with hypoxia, no evidence for acute bronchospasm on clinical exam.  Low oxygen saturation on room air with arterial blood gas, and again hypoxia while on oxygen per nasal cannula.  CRITICAL CARE-yes Performed by: Daleen Bo  Nursing Notes Reviewed/ Care Coordinated Applicable Imaging Reviewed Interpretation of Laboratory Data incorporated into ED treatment  Plan-admission after CT angiogram to rule out PE returns.  Patient will need oxygen support, and ongoing management, requiring admission  Final Clinical Impressions(s) / ED Diagnoses   Final diagnoses:  Hypoxia     ED Discharge Orders    None       Daleen Bo,  MD 03/21/18 1649

## 2018-03-21 NOTE — ED Notes (Signed)
Report attempted 

## 2018-03-21 NOTE — ED Notes (Signed)
Two unsuccessful IV attempts. Able to get blood draw, but IV blew.

## 2018-03-21 NOTE — ED Triage Notes (Signed)
Pt sent from PCP for unresolved bronchitis and hypoxia.

## 2018-03-21 NOTE — Progress Notes (Signed)
Attempted to call and receive report, ER nurse busy in a code stroke.

## 2018-03-22 ENCOUNTER — Observation Stay (HOSPITAL_BASED_OUTPATIENT_CLINIC_OR_DEPARTMENT_OTHER): Payer: Medicare Other

## 2018-03-22 DIAGNOSIS — I10 Essential (primary) hypertension: Secondary | ICD-10-CM | POA: Diagnosis not present

## 2018-03-22 DIAGNOSIS — J9601 Acute respiratory failure with hypoxia: Secondary | ICD-10-CM | POA: Diagnosis not present

## 2018-03-22 DIAGNOSIS — I361 Nonrheumatic tricuspid (valve) insufficiency: Secondary | ICD-10-CM | POA: Diagnosis not present

## 2018-03-22 LAB — ECHOCARDIOGRAM COMPLETE
Height: 62 in
Weight: 2289.6 oz

## 2018-03-22 LAB — BASIC METABOLIC PANEL
Anion gap: 6 (ref 5–15)
BUN: 13 mg/dL (ref 8–23)
CO2: 29 mmol/L (ref 22–32)
Calcium: 8.7 mg/dL — ABNORMAL LOW (ref 8.9–10.3)
Chloride: 104 mmol/L (ref 98–111)
Creatinine, Ser: 0.85 mg/dL (ref 0.44–1.00)
GFR calc Af Amer: >60 mL/min (ref >60)
GFR calc non Af Amer: >60 mL/min (ref >60)
Glucose, Bld: 85 mg/dL (ref 70–99)
Potassium: 5.1 mmol/L (ref 3.5–5.1)
Sodium: 139 mmol/L (ref 135–145)

## 2018-03-22 LAB — CBC
HCT: 38.2 % (ref 36.0–46.0)
Hemoglobin: 12.1 g/dL (ref 12.0–15.0)
MCH: 31.2 pg (ref 26.0–34.0)
MCHC: 31.7 g/dL (ref 30.0–36.0)
MCV: 98.5 fL (ref 80.0–100.0)
Platelets: 298 10*3/uL (ref 150–400)
RBC: 3.88 MIL/uL (ref 3.87–5.11)
RDW: 11.7 % (ref 11.5–15.5)
WBC: 6.2 10*3/uL (ref 4.0–10.5)
nRBC: 0 % (ref 0.0–0.2)

## 2018-03-22 MED ORDER — IPRATROPIUM-ALBUTEROL 0.5-2.5 (3) MG/3ML IN SOLN: 3.0000 mL | Freq: Four times a day (QID) | RESPIRATORY_TRACT | Status: DC | PRN

## 2018-03-22 MED ORDER — LOSARTAN POTASSIUM 50 MG PO TABS
50.0000 mg | ORAL_TABLET | Freq: Every day | ORAL | Status: DC
  Administered 2018-03-23: 50 mg via ORAL
  Filled 2018-03-22: qty 1

## 2018-03-22 NOTE — Progress Notes (Signed)
Patient admitted into 5c15. On arrival, A/Ox4. MAE. Vital signs stable. Made her comfortable and oriented her to the room and unit.

## 2018-03-22 NOTE — Progress Notes (Signed)
Progress Note    Adrienne Daughtridge Quevedo  GYK:[id number] DOB: 1933-06-07  DOA: 03/21/2018 PCP: [name], MD  Cardiology: Dr. Einar Gip  Brief Narrative:     Medical records reviewed and are as summarized below:  [name] is an 85 y.o. female with medical history significant of coronary artery disease status post CABG, hyperlipidemia who presents with shortness of breath and fatigue, worsening over the last month.  She was diagnosed with bronchitis, and at her follow-up appointment, she was found to be hypoxic on room air in the 80s.    Assessment/Plan:   Principal Problem:   Acute hypoxemic respiratory failure (HCC) Active Problems:   CAD (coronary artery disease)   HTN (hypertension)   Depression  Acute hypoxemic respiratory failure CTA chest negative for PE, however did find bronchial wall thickening/ ground glass  - treat for bronchitis with doxycycline -wean O2 as able -echo to r/o valvular issues  CAD Continue aspirin, Crestor  Essential hypertension Continue Lopressor, Cozaar but with holding parameters for low BP  Depression Continue Celexa    Family Communication/Anticipated D/C date and plan/Code Status   DVT prophylaxis: Lovenox ordered. Code Status: Full Code.  Family Communication:  Disposition Plan: pending ability to wean off O2 as well as echo results-- suspect 1-2 more days of hospitalization   Medical Consultants:    None.     Subjective:   Has a chronic cough but came in with hypoxia/dyspnea/fatigue worsening over last month  Objective:    Vitals:   03/21/18 2245 03/22/18 0615 03/22/18 0835 03/22/18 0839  BP: (!) 113/59 (!) 104/59    Pulse: 62 (!) 55    Resp: 20 18    Temp: 98.2 F (36.8 C) 98.4 F (36.9 C)    TempSrc: Oral Oral    SpO2: 94% 95% (!) 85% 92%  Weight:      Height:        Intake/Output Summary (Last 24 hours) at 03/22/2018 1012 Last data filed at 03/21/2018 1539 Gross per 24  hour  Intake 500 ml  Output -  Net 500 ml   Filed Weights   03/21/18 1149 03/21/18 2000  Weight: 65.3 kg 64.9 kg    Exam: In chair on 2L O2 (none at home) Diminished breath sounds rrr- ? Aortic murmur Min LE Edema  Data Reviewed:   I have personally reviewed following labs and imaging studies:  Labs: Labs show the following:   Basic Metabolic Panel: Recent Labs  Lab 03/21/18 1212 03/21/18 1453 03/22/18 0552  NA 136 135 139  K 4.4 4.1 5.1  CL 98  --  104  CO2 28  --  29  GLUCOSE 82  --  85  BUN 13  --  13  CREATININE 0.82  --  0.85  CALCIUM 9.7  --  8.7*   GFR Estimated Creatinine Clearance: 45.1 mL/min (by C-G formula based on SCr of 0.85 mg/dL). Liver Function Tests: Recent Labs  Lab 03/21/18 1212  AST 20  ALT 16  ALKPHOS 49  BILITOT 0.8  PROT 7.3  ALBUMIN 3.5   No results for input(s): LIPASE, AMYLASE in the last 168 hours. No results for input(s): AMMONIA in the last 168 hours. Coagulation profile No results for input(s): INR, PROTIME in the last 168 hours.  CBC: Recent Labs  Lab 03/21/18 1212 03/21/18 1453 03/22/18 0552  WBC 5.9  --  6.2  NEUTROABS 3.2  --   --   HGB 13.6 12.2 12.1  HCT 42.0 36.0 38.2  MCV 97.2  --  98.5  PLT 303  --  298   Cardiac Enzymes: Recent Labs  Lab 03/21/18 1212  TROPONINI <0.03   BNP (last 3 results) No results for input(s): PROBNP in the last 8760 hours. CBG: No results for input(s): GLUCAP in the last 168 hours. D-Dimer: Recent Labs    03/21/18 1212  DDIMER 1.04*   Hgb A1c: No results for input(s): HGBA1C in the last 72 hours. Lipid Profile: No results for input(s): CHOL, HDL, LDLCALC, TRIG, CHOLHDL, LDLDIRECT in the last 72 hours. Thyroid function studies: No results for input(s): TSH, T4TOTAL, T3FREE, THYROIDAB in the last 72 hours.  Invalid input(s): FREET3 Anemia work up: No results for input(s): VITAMINB12, FOLATE, FERRITIN, TIBC, IRON, RETICCTPCT in the last 72 hours. Sepsis  Labs: Recent Labs  Lab 03/21/18 1212 03/22/18 0552  WBC 5.9 6.2    Microbiology No results found for this or any previous visit (from the past 240 hour(s)).  Procedures and diagnostic studies:  Ct Angio Chest Pe W/cm &/or Wo Cm  Result Date: 03/21/2018 CLINICAL DATA:  Hypoxia, unresolved bronchitis EXAM: CT ANGIOGRAPHY CHEST WITH CONTRAST TECHNIQUE: Multidetector CT imaging of the chest was performed using the standard protocol during bolus administration of intravenous contrast. Multiplanar CT image reconstructions and MIPs were obtained to evaluate the vascular anatomy. CONTRAST:  62mL ISOVUE-370 IOPAMIDOL (ISOVUE-370) INJECTION 76% COMPARISON:  Chest radiograph, 03/11/2018 FINDINGS: Cardiovascular: Satisfactory opacification of the pulmonary arteries to the segmental level. No evidence of pulmonary embolism. Status post median sternotomy and CABG. Three-vessel coronary artery calcifications and/or stents. Normal heart size. No pericardial effusion. Aortic atherosclerosis. Mediastinum/Nodes: No enlarged mediastinal, hilar, or axillary lymph nodes. Thyroid gland, trachea, and esophagus demonstrate no significant findings. Lungs/Pleura: There are numerous scattered bilateral ground-glass and small nodular pulmonary opacities. Diffuse mosaic attenuation of the airspaces and mild bilateral bronchial wall thickening. No pleural effusion or pneumothorax. Upper Abdomen: No acute abnormality. Musculoskeletal: No chest wall abnormality. No acute or significant osseous findings. Review of the MIP images confirms the above findings. IMPRESSION: 1.  Negative examination for pulmonary embolism. 2. There are numerous scattered bilateral ground-glass and small nodular pulmonary opacities. Diffuse mosaic attenuation of the airspaces and mild bilateral bronchial wall thickening. Findings are nonspecific and infectious or inflammatory. Given the presence of nodularity, recommend follow-up CT examination at 3 months  to evaluate for resolution. 3.  Coronary artery disease. Electronically Signed   By: Eddie Candle M.D.   On: 03/21/2018 16:47   Dg Chest Port 1 View  Result Date: 03/21/2018 CLINICAL DATA:  Shortness of breath.  Hypoxia.  Cough. EXAM: PORTABLE CHEST 1 VIEW COMPARISON:  03/19/2018 and 08/22/2012 FINDINGS: The heart size and pulmonary vascularity are normal. Aortic atherosclerosis. CABG. No infiltrates or effusions. Minimal chronic accentuation of the interstitial markings at the left lung base laterally. No acute bone abnormality. IMPRESSION: No acute abnormalities. Aortic Atherosclerosis (ICD10-I70.0). Electronically Signed   By: Lorriane Shire M.D.   On: 03/21/2018 12:38    Medications:   . aspirin  81 mg Oral Daily  . citalopram  20 mg Oral Daily  . doxycycline  100 mg Oral Q12H  . enoxaparin (LOVENOX) injection  40 mg Subcutaneous Q24H  . ezetimibe  10 mg Oral QODAY  . [START ON 03/23/2018] losartan  50 mg Oral Daily  . metoprolol tartrate  25 mg Oral BID  . rosuvastatin  20 mg Oral QODAY   Continuous Infusions:   LOS: 0 days  Geradine Girt  [facility]   How to contact the [facility] Attending or Consulting provider [place] or covering provider during after hours [place], for this patient?  1. Check the care team in [facility] and look for a) attending/consulting [facility] provider listed and b) the [facility] team listed 2. Log into [email or web address] and use [place]'s universal password to access. If you do not have the password, please contact the hospital operator. 3. Locate the [facility] provider you are looking for under [facility] and page to a number that you can be directly reached. 4. If you still have difficulty reaching the provider, please page the [facility] (Director on Call) for the Hospitalists listed on amion for assistance.  03/22/2018, 10:12 AM

## 2018-03-22 NOTE — Progress Notes (Signed)
*  PRELIMINARY RESULTS* Echocardiogram 2D Echocardiogram has been performed.  Samuel Germany 03/22/2018, 12:15 PM

## 2018-03-23 DIAGNOSIS — J9601 Acute respiratory failure with hypoxia: Secondary | ICD-10-CM | POA: Diagnosis not present

## 2018-03-23 LAB — BASIC METABOLIC PANEL WITH GFR
Anion gap: 3 — ABNORMAL LOW (ref 5–15)
BUN: 13 mg/dL (ref 8–23)
CO2: 33 mmol/L — ABNORMAL HIGH (ref 22–32)
Calcium: 8.6 mg/dL — ABNORMAL LOW (ref 8.9–10.3)
Chloride: 100 mmol/L (ref 98–111)
Creatinine, Ser: 0.79 mg/dL (ref 0.44–1.00)
GFR calc Af Amer: >60 mL/min
GFR calc non Af Amer: >60 mL/min
Glucose, Bld: 84 mg/dL (ref 70–99)
Potassium: 4.4 mmol/L (ref 3.5–5.1)
Sodium: 136 mmol/L (ref 135–145)

## 2018-03-23 LAB — CBC
HCT: 37.4 % (ref 36.0–46.0)
Hemoglobin: 11.9 g/dL — ABNORMAL LOW (ref 12.0–15.0)
MCH: 31.2 pg (ref 26.0–34.0)
MCHC: 31.8 g/dL (ref 30.0–36.0)
MCV: 98.2 fL (ref 80.0–100.0)
Platelets: 276 10*3/uL (ref 150–400)
RBC: 3.81 MIL/uL — ABNORMAL LOW (ref 3.87–5.11)
RDW: 11.6 % (ref 11.5–15.5)
WBC: 6 10*3/uL (ref 4.0–10.5)
nRBC: 0 % (ref 0.0–0.2)

## 2018-03-23 MED ORDER — SENNOSIDES-DOCUSATE SODIUM 8.6-50 MG PO TABS
1.0000 | ORAL_TABLET | Freq: Two times a day (BID) | ORAL | Status: DC
  Administered 2018-03-23: 1 via ORAL
  Filled 2018-03-23: qty 1

## 2018-03-23 MED ORDER — DOXYCYCLINE HYCLATE 100 MG PO TABS: 100.0000 mg | ORAL_TABLET | Freq: Two times a day (BID) | ORAL | 0 refills | Status: DC

## 2018-03-23 NOTE — Discharge Instructions (Signed)
Will need repeat follow CT scan in 3 months

## 2018-03-23 NOTE — Plan of Care (Signed)
  Problem: Education: Goal: Knowledge of General Education information will improve Description Including pain rating scale, medication(s)/side effects and non-pharmacologic comfort measures Outcome: Progressing   Problem: Clinical Measurements: Goal: Respiratory complications will improve Outcome: Progressing   Problem: Safety: Goal: Ability to remain free from injury will improve Outcome: Progressing   Problem: Clinical Measurements: Goal: Cardiovascular complication will be avoided Outcome: Progressing   Problem: Clinical Measurements: Goal: Diagnostic test results will improve Outcome: Progressing

## 2018-03-23 NOTE — Discharge Summary (Signed)
Physician Discharge Summary  [facility] JHE:[id number] DOB: 12-10-1933 DOA: 03/21/2018  PCP: [name], MD  Admit date: 03/21/2018 Discharge date: 03/23/2018  Admitted From: home Discharge disposition: home   Recommendations for Outpatient Follow-Up:   Will need repeat CT Scan of lungs in 3 months to ensure resolution of abnormalities -if after patient finishes abx she continues to have SOB may need to follow up with DR. Ganji as echo showed: Severe Basal Septal hypertrophy Monitor O2 sats at each office visit  Discharge Diagnosis:   Principal Problem:   Acute hypoxemic respiratory failure (HCC) Active Problems:   CAD (coronary artery disease)   HTN (hypertension)   Depression    Discharge Condition: Improved.  Diet recommendation: Low sodium, heart healthy..  Wound care: None.  Code status: Full.   History of Present Illness:   [name] is a 85 y.o. female with medical history significant of coronary artery disease status post CABG, hyperlipidemia who presents with shortness of breath.  She was diagnosed with bronchitis, and at her follow-up appointment, she was found to be hypoxic on room air in the 80s.  Labs largely unremarkable, negative troponin, no leukocytosis.  Today without any acute abnormalities.  CTA chest negative for PE, did find numerous scattered bilateral groundglass and small nodular pulmonary opacities, diffuse mosaic attenuation of airspaces and mild bilateral bronchial wall thickening.  She was given a breathing treatment, referred for admission because of ongoing nasal cannula oxygen requirement   Hospital Course by Problem:   Acute hypoxemic respiratory failure CTA chest negative for PE, however did find bronchial wall thickening/ ground glass  - treat for bronchitis with doxycycline -was able to wean O2 to off (patient had on nail polish so not sure low readings were correct-- patient was  asymptomatic) -will need repeat CT Scan of lungs to ensure resolution of ground glass appearance Echo:  The left ventricle has normal systolic function, with an ejection fraction of 55-60%. The cavity size was normal. Severe Basal Septal hypertrophy. Left ventricular diastolic Doppler parameters are indeterminate due to nondiagnostic images.  CAD Continue aspirin, Crestor  Essential hypertension Continue Lopressor -hold cozaar for low BP  Depression Continue Celexa    Medical Consultants:      Discharge Exam:   Vitals:   03/23/18 0519 03/23/18 0527  BP: (!) 107/43   Pulse: 62   Resp: 16   Temp: 98.5 F (36.9 C)   SpO2: (!) 89% 93%   Vitals:   03/22/18 2102 03/22/18 2111 03/23/18 0519 03/23/18 0527  BP: (!) 103/53  (!) 107/43   Pulse: (!) 59  62   Resp: 16  16   Temp: 98.6 F (37 C)  98.5 F (36.9 C)   TempSrc: Oral  Oral   SpO2: (!) 88% 93% (!) 89% 93%  Weight:      Height:        General exam: Appears calm and comfortable.     The results of significant diagnostics from this hospitalization (including imaging, microbiology, ancillary and laboratory) are listed below for reference.     Procedures and Diagnostic Studies:   Ct Angio Chest Pe W/cm &/or Wo Cm  Result Date: 03/21/2018 CLINICAL DATA:  Hypoxia, unresolved bronchitis EXAM: CT ANGIOGRAPHY CHEST WITH CONTRAST TECHNIQUE: Multidetector CT imaging of the chest was performed using the standard protocol during bolus administration of intravenous contrast. Multiplanar CT image reconstructions and MIPs were obtained to evaluate the vascular anatomy. CONTRAST:  81mL ISOVUE-370 IOPAMIDOL (  ISOVUE-370) INJECTION 76% COMPARISON:  Chest radiograph, 03/11/2018 FINDINGS: Cardiovascular: Satisfactory opacification of the pulmonary arteries to the segmental level. No evidence of pulmonary embolism. Status post median sternotomy and CABG. Three-vessel coronary artery calcifications and/or stents. Normal heart size. No  pericardial effusion. Aortic atherosclerosis. Mediastinum/Nodes: No enlarged mediastinal, hilar, or axillary lymph nodes. Thyroid gland, trachea, and esophagus demonstrate no significant findings. Lungs/Pleura: There are numerous scattered bilateral ground-glass and small nodular pulmonary opacities. Diffuse mosaic attenuation of the airspaces and mild bilateral bronchial wall thickening. No pleural effusion or pneumothorax. Upper Abdomen: No acute abnormality. Musculoskeletal: No chest wall abnormality. No acute or significant osseous findings. Review of the MIP images confirms the above findings. IMPRESSION: 1.  Negative examination for pulmonary embolism. 2. There are numerous scattered bilateral ground-glass and small nodular pulmonary opacities. Diffuse mosaic attenuation of the airspaces and mild bilateral bronchial wall thickening. Findings are nonspecific and infectious or inflammatory. Given the presence of nodularity, recommend follow-up CT examination at 3 months to evaluate for resolution. 3.  Coronary artery disease. Electronically Signed   By: Eddie Candle M.D.   On: 03/21/2018 16:47   Dg Chest Port 1 View  Result Date: 03/21/2018 CLINICAL DATA:  Shortness of breath.  Hypoxia.  Cough. EXAM: PORTABLE CHEST 1 VIEW COMPARISON:  03/19/2018 and 08/22/2012 FINDINGS: The heart size and pulmonary vascularity are normal. Aortic atherosclerosis. CABG. No infiltrates or effusions. Minimal chronic accentuation of the interstitial markings at the left lung base laterally. No acute bone abnormality. IMPRESSION: No acute abnormalities. Aortic Atherosclerosis (ICD10-I70.0). Electronically Signed   By: Lorriane Shire M.D.   On: 03/21/2018 12:38     Labs:   Basic Metabolic Panel: Recent Labs  Lab 03/21/18 1212 03/21/18 1453 03/22/18 0552 03/23/18 0508  NA 136 135 139 136  K 4.4 4.1 5.1 4.4  CL 98  --  104 100  CO2 28  --  29 33*  GLUCOSE 82  --  85 84  BUN 13  --  13 13  CREATININE 0.82  --  0.85  0.79  CALCIUM 9.7  --  8.7* 8.6*   GFR Estimated Creatinine Clearance: 47.9 mL/min (by C-G formula based on SCr of 0.79 mg/dL). Liver Function Tests: Recent Labs  Lab 03/21/18 1212  AST 20  ALT 16  ALKPHOS 49  BILITOT 0.8  PROT 7.3  ALBUMIN 3.5   No results for input(s): LIPASE, AMYLASE in the last 168 hours. No results for input(s): AMMONIA in the last 168 hours. Coagulation profile No results for input(s): INR, PROTIME in the last 168 hours.  CBC: Recent Labs  Lab 03/21/18 1212 03/21/18 1453 03/22/18 0552 03/23/18 0508  WBC 5.9  --  6.2 6.0  NEUTROABS 3.2  --   --   --   HGB 13.6 12.2 12.1 11.9*  HCT 42.0 36.0 38.2 37.4  MCV 97.2  --  98.5 98.2  PLT 303  --  298 276   Cardiac Enzymes: Recent Labs  Lab 03/21/18 1212  TROPONINI <0.03   BNP: Invalid input(s): POCBNP CBG: No results for input(s): GLUCAP in the last 168 hours. D-Dimer Recent Labs    03/21/18 1212  DDIMER 1.04*   Hgb A1c No results for input(s): HGBA1C in the last 72 hours. Lipid Profile No results for input(s): CHOL, HDL, LDLCALC, TRIG, CHOLHDL, LDLDIRECT in the last 72 hours. Thyroid function studies No results for input(s): TSH, T4TOTAL, T3FREE, THYROIDAB in the last 72 hours.  Invalid input(s): FREET3 Anemia work up No results for  input(s): VITAMINB12, FOLATE, FERRITIN, TIBC, IRON, RETICCTPCT in the last 72 hours. Microbiology No results found for this or any previous visit (from the past 240 hour(s)).   Discharge Instructions:    Allergies as of 03/23/2018      Reactions   Hydrocodone Shortness Of Breath   Rash-itching-tongue swelled   Fenofibrate Other (See Comments)   Caused every joint to become stif   Oxycodone Swelling   Rash-itching   Latex Rash, Other (See Comments)   "Blisters in mouth"      Medication List    STOP taking these medications   losartan 100 MG tablet Commonly known as:  COZAAR     TAKE these medications   aspirin 81 MG tablet Take 81 mg by  mouth daily.   Calcium 600-200 MG-UNIT tablet Take 2 tablets by mouth daily.   citalopram 20 MG tablet Commonly known as:  CELEXA Take 20 mg by mouth daily.   denosumab 60 MG/ML Sosy injection Commonly known as:  PROLIA Inject 60 mg into the skin every 6 (six) months. August 2019   doxycycline 100 MG tablet Commonly known as:  VIBRA-TABS Take 1 tablet (100 mg total) by mouth every 12 (twelve) hours.   ezetimibe 10 MG tablet Commonly known as:  ZETIA Take 10 mg by mouth every other day.   metoprolol tartrate 25 MG tablet Commonly known as:  LOPRESSOR Take 1 tablet (25 mg total) by mouth 2 (two) times daily.   PRESERVISION AREDS PO Take 2 tablets by mouth 2 (two) times daily.   rosuvastatin 20 MG tablet Commonly known as:  CRESTOR Take 20 mg by mouth every other day.   VITAMIN D3 PO Take 3 capsules by mouth daily.      Follow-up Information    [name], MD Follow up in 1 week(s).   Specialty:  Internal Medicine Contact information: [place] [address] [phone]        Adrian Prows, MD Follow up.   Specialty:  Cardiology Why:  schedule follow up with Dr. Einar Gip if still having issues with breathing after finishing antibiotics Contact information: [place] [address] [phone]            Time coordinating discharge: 25 min  Signed:  Geradine Girt DO  [facility] 03/23/2018, 12:20 PM

## 2018-03-23 NOTE — Progress Notes (Signed)
Pt pulse ox 91 on room air with sitting.  When ambulates, O2 drops to 88% on room air.

## 2018-03-31 ENCOUNTER — Ambulatory Visit: Payer: Self-pay | Admitting: Cardiology

## 2018-03-31 ENCOUNTER — Encounter: Payer: Self-pay | Admitting: Cardiology

## 2018-03-31 ENCOUNTER — Ambulatory Visit (INDEPENDENT_AMBULATORY_CARE_PROVIDER_SITE_OTHER): Payer: Medicare Other | Admitting: Cardiology

## 2018-03-31 VITALS — BP 98/50 | HR 66 | Ht 62.0 in | Wt 141.6 lb

## 2018-03-31 DIAGNOSIS — R0902 Hypoxemia: Secondary | ICD-10-CM

## 2018-03-31 DIAGNOSIS — R0609 Other forms of dyspnea: Secondary | ICD-10-CM

## 2018-03-31 DIAGNOSIS — R0600 Dyspnea, unspecified: Secondary | ICD-10-CM

## 2018-03-31 DIAGNOSIS — R9389 Abnormal findings on diagnostic imaging of other specified body structures: Secondary | ICD-10-CM | POA: Diagnosis not present

## 2018-03-31 DIAGNOSIS — I25110 Atherosclerotic heart disease of native coronary artery with unstable angina pectoris: Secondary | ICD-10-CM | POA: Diagnosis not present

## 2018-03-31 DIAGNOSIS — I2109 ST elevation (STEMI) myocardial infarction involving other coronary artery of anterior wall: Secondary | ICD-10-CM | POA: Diagnosis not present

## 2018-03-31 NOTE — Progress Notes (Signed)
Subjective:   [name], female    DOB: 1933/09/23, 85 y.o.   MRN: [id number]  Adrienne Gravel, MD:  Chief Complaint  Patient presents with  . Coronary Artery Disease  . Hypertension  . Shortness of Breath  . Follow-up    last EKG 12/12/17    HPI: [name]  is a 85 y.o. female  with [name] is a very pleasant Caucasian female with history of known coronary artery disease and has had CABG in 2007, has asymptomatic carotid artery stenosis, underwent right carotid endarterectomy by Dr. Ruta Hinds in July 2014, was found to have restenosis and she underwent right carotid artery stenting on 04/11/2013.  Patient was recently admitted to the hospital for acute hypoxemia respiratory failure and bronchitis on 03/21/2018. CTA of the chest was negative for PE, but was noted to have numerous scattered bilateral groundglass and small nodular opacities, diffuse mosaic attenuation of airspaces and mild bilateral bronchial wall thickening. Echo was also performed that revealed normal LVEF and severe basal septal hypertrophy. She was treated with antibiotics, but advised on discharge on 03/23/2018 that if her shortness of breath did not improve, to contact her cardiologist. She reports that she continues to feel tired and have shortness of breath; however, her shortness of breath has improved. She denies any PND or orthopnea. No fever. She has chronic cough and states that she has had workup form multiple specialties without known etiology. She denies any chest pain or leg edema.   She does report falling while standing up that occurred on the first day she was home from the hospital. She states that she is having difficulty with moving her right foot since the fall and feels that it may have possibly been injured during the fall.    Past Medical History:  Diagnosis Date  . Arthritis   . Chronic cough   . Coronary artery disease   .  Hypercholesteremia   . Internal carotid artery stenosis 08/2012  . Normal cardiac stress test 08/2012   @ Dr Irven Shelling office  . Peripheral vascular disease [facility])     Past Surgical History:  Procedure Laterality Date  . ABDOMINAL HYSTERECTOMY    . APPENDECTOMY    . CAROTID ANGIOGRAM N/A 04/01/2013   Procedure: CAROTID ANGIOGRAM;  Surgeon: Elam Dutch, MD;  Location: [facility] CATH LAB;  Service: Cardiovascular;  Laterality: N/A;  . CAROTID ENDARTERECTOMY Right August 25, 2012   cea  . CAROTID STENT INSERTION Right 04/22/2013   Procedure: CAROTID STENT INSERTION;  Surgeon: Elam Dutch, MD;  Location: [facility] CATH LAB;  Service: Cardiovascular;  Laterality: Right;  . CORONARY ARTERY BYPASS GRAFT    . ENDARTERECTOMY Right 08/25/2012   Procedure: ENDARTERECTOMY CAROTID;  Surgeon: Elam Dutch, MD;  Location: [place];  Service: Vascular;  Laterality: Right;  . EYE SURGERY     both cataracts  . FRACTURE SURGERY Left Jun 14, 2011   Ankle  . ORIF ANKLE FRACTURE  06/14/2011   Procedure: OPEN REDUCTION INTERNAL FIXATION (ORIF) ANKLE FRACTURE;  Surgeon: Ninetta Lights, MD;  Location: [place];  Service: Orthopedics;  Laterality: Left;  left ankle fracture open treatment trimalleolar ankle includes internal fixation WITHOUT fixation of posterior lip  . SHOULDER ARTHROSCOPY WITH SUBACROMIAL DECOMPRESSION, ROTATOR CUFF REPAIR AND BICEP TENDON REPAIR Left 06/05/2012   Procedure: LEFT SHOULDER ARTHROSCOPY WITH SUBACROMIAL DECOMPRESSION, PARTIAL ACROMIOPLASTY WITH CORACOACROMIAL RELEASE, DISTAL CLAVICULECTOMY, WITH ROTATOR CUFF REPAIR AND BICEP TENODESIS;  Surgeon: Ninetta Lights, MD;  Location: [place];  Service: Orthopedics;  Laterality: Left;    Family History  Problem Relation Age of Onset  . Cancer Father        Brain   . Diabetes Sister   . Heart disease Sister        after age 75  . Hyperlipidemia Sister   . Heart disease Brother        After age 3  . Cancer  Sister     Social History   Socioeconomic History  . Marital status: Widowed    Spouse name: Not on file  . Number of children: 1  . Years of education: Not on file  . Highest education level: Not on file  Occupational History  . Not on file  Social Needs  . Financial resource strain: Not on file  . Food insecurity:    Worry: Not on file    Inability: Not on file  . Transportation needs:    Medical: Not on file    Non-medical: Not on file  Tobacco Use  . Smoking status: Never Smoker  . Smokeless tobacco: Never Used  Substance and Sexual Activity  . Alcohol use: No  . Drug use: No  . Sexual activity: Never  Lifestyle  . Physical activity:    Days per week: Not on file    Minutes per session: Not on file  . Stress: Not on file  Relationships  . Social connections:    Talks on phone: Not on file    Gets together: Not on file    Attends religious service: Not on file    Active member of club or organization: Not on file    Attends meetings of clubs or organizations: Not on file    Relationship status: Not on file  . Intimate partner violence:    Fear of current or ex partner: Not on file    Emotionally abused: Not on file    Physically abused: Not on file    Forced sexual activity: Not on file  Other Topics Concern  . Not on file  Social History Narrative  . Not on file    Current Meds  Medication Sig  . aspirin 81 MG tablet Take 81 mg by mouth daily.  . Calcium 600-200 MG-UNIT tablet Take 2 tablets by mouth daily.  . Cholecalciferol (VITAMIN D3 PO) Take 3 capsules by mouth daily.  . citalopram (CELEXA) 20 MG tablet Take 20 mg by mouth daily.   Marland Kitchen denosumab (PROLIA) 60 MG/ML SOSY injection Inject 60 mg into the skin every 6 (six) months. August 2019  . ezetimibe (ZETIA) 10 MG tablet Take 10 mg by mouth every other day.   . losartan (COZAAR) 100 MG tablet Take 100 mg by mouth daily.  . metoprolol tartrate (LOPRESSOR) 25 MG tablet Take 1 tablet (25 mg total) by  mouth 2 (two) times daily.  . Multiple Vitamins-Minerals (PRESERVISION AREDS PO) Take 2 tablets by mouth 2 (two) times daily.  . rosuvastatin (CRESTOR) 20 MG tablet Take 20 mg by mouth every other day.      Review of Systems  Constitution: Negative for decreased appetite, malaise/fatigue, weight gain and weight loss.  Eyes: Negative for visual disturbance.  Cardiovascular: Positive for dyspnea on exertion. Negative for chest pain, claudication, leg swelling, orthopnea, palpitations and syncope.  Respiratory: Positive for cough. Negative for hemoptysis and wheezing.   Endocrine: Negative for cold intolerance and heat intolerance.  Hematologic/Lymphatic:  Does not bruise/bleed easily.  Skin: Negative for nail changes.  Musculoskeletal: Negative for muscle weakness and myalgias.  Gastrointestinal: Negative for abdominal pain, change in bowel habit, nausea and vomiting.  Neurological: Negative for difficulty with concentration, dizziness, focal weakness and headaches.  Psychiatric/Behavioral: Negative for altered mental status and suicidal ideas.  All other systems reviewed and are negative.      Objective:     Blood pressure (!) 98/50, pulse 66, height 5\' 2"  (1.575 m), weight 141 lb 9.6 oz (64.2 kg), SpO2 92 %.  Echocardiogram 03/22/2018:   1. The left ventricle has normal systolic function, with an ejection fraction of 55-60%. The cavity size was normal. Severe Basal Septal hypertrophy. Left ventricular diastolic Doppler parameters are indeterminate due to nondiagnostic images.  2. The right ventricle has normal systolic function. The cavity was normal. There is no increase in right ventricular wall thickness.  3. The mitral valve is normal in structure. There is mild mitral annular calcification present.  4. The tricuspid valve is normal in structure.  5. The aortic valve is tricuspid Mild sclerosis of the aortic valve.  6. The pulmonic valve was normal in structure.  7. Right atrial  pressure is estimated at 3 mmHg.  8. The interatrial septum appears to be lipomatous.  9. When compared to the prior study: No prior Echo to compare.   CTA of chest 03/21/2018: 1.  Negative examination for pulmonary embolism.  2. There are numerous scattered bilateral ground-glass and small nodular pulmonary opacities. Diffuse mosaic attenuation of the airspaces and mild bilateral bronchial wall thickening. Findings are nonspecific and infectious or inflammatory. Given the presence of nodularity, recommend follow-up CT examination at 3 months to evaluate for resolution.  3.  Coronary artery disease.  Physical Exam  Constitutional: She is oriented to person, place, and time. Vital signs are normal. She appears well-developed and well-nourished.  HENT:  Head: Normocephalic and atraumatic.  Neck: Normal range of motion.  Cardiovascular: Normal rate, regular rhythm, normal heart sounds and intact distal pulses.  Pulses:      Carotid pulses are on the right side with bruit and on the left side with bruit.      Femoral pulses are 2+ on the right side and 2+ on the left side.      Popliteal pulses are 2+ on the right side and 2+ on the left side.       Dorsalis pedis pulses are 1+ on the right side and 1+ on the left side.       Posterior tibial pulses are 1+ on the right side and 1+ on the left side.  Pulmonary/Chest: Effort normal and breath sounds normal. No accessory muscle usage. No respiratory distress.  Abdominal: Soft. Bowel sounds are normal.  Musculoskeletal: Normal range of motion.  Neurological: She is alert and oriented to person, place, and time.  Skin: Skin is warm and dry.  Vitals reviewed.          Assessment & Recommendations:   Dyspnea on exertion  Hypoxemia  Abnormal CT of the chest - Plan: Ambulatory referral to Pulmonology  Coronary artery disease involving native coronary artery of native heart with unstable angina pectoris ([place])   Plan: I have  reviewed labs, imaging, and records from recent hospitalization. She has recently completed her course of antibiotics. No fever. She is noted to have borderline low resting Spo2 in our office. No clinical evidence of heart failure. Although has basal septal hypertrophy by echo, no LVOT obstruction  and normal LVEF and feel that her symptoms are likely pulmonary etiology. Suspect that given her CT scan of chest of the findings, she should be evaluated by pulmnology. Has chronic cough and GERD that could be etiology for pulmonary issues as well.   No symptoms of chest pain. Do not suspect that her dyspnea is angina equivalent. Will arrange for evaluation with Dr. Chase Caller. No changes were made to medications today.   I have encouraged her to contact her PCP for evaluation of her right lower leg weakness. No evidence of CVA or TIA.  I will see her back in 6 months for follow up on CAD and dyspnea on exertion, but encouraged her to contact us sooner for any worsening problems. Jeri Lager, FNP-C [facility] [place] Office: [phone] Fax: [phone]

## 2018-04-04 ENCOUNTER — Encounter: Payer: Self-pay | Admitting: Cardiology

## 2018-04-04 DIAGNOSIS — R0609 Other forms of dyspnea: Principal | ICD-10-CM

## 2018-04-04 DIAGNOSIS — R0600 Dyspnea, unspecified: Secondary | ICD-10-CM | POA: Insufficient documentation

## 2018-04-04 DIAGNOSIS — R9389 Abnormal findings on diagnostic imaging of other specified body structures: Secondary | ICD-10-CM | POA: Insufficient documentation

## 2018-04-04 DIAGNOSIS — R0902 Hypoxemia: Secondary | ICD-10-CM | POA: Insufficient documentation

## 2018-04-08 DIAGNOSIS — M21371 Foot drop, right foot: Secondary | ICD-10-CM | POA: Diagnosis not present

## 2018-04-08 DIAGNOSIS — R5383 Other fatigue: Secondary | ICD-10-CM | POA: Diagnosis not present

## 2018-04-08 DIAGNOSIS — R2681 Unsteadiness on feet: Secondary | ICD-10-CM | POA: Diagnosis not present

## 2018-04-08 DIAGNOSIS — S3992XA Unspecified injury of lower back, initial encounter: Secondary | ICD-10-CM | POA: Diagnosis not present

## 2018-04-08 DIAGNOSIS — I10 Essential (primary) hypertension: Secondary | ICD-10-CM | POA: Diagnosis not present

## 2018-04-10 ENCOUNTER — Other Ambulatory Visit: Payer: Self-pay | Admitting: Internal Medicine

## 2018-04-10 DIAGNOSIS — M21371 Foot drop, right foot: Secondary | ICD-10-CM

## 2018-04-20 ENCOUNTER — Telehealth: Payer: Self-pay | Admitting: *Deleted

## 2018-04-20 NOTE — Telephone Encounter (Signed)
Left message letting patient know our office will be closed, on 04/21/2018, to disinfect the facility.  We will call back to reschedule the appointment.  

## 2018-04-21 ENCOUNTER — Telehealth: Payer: Self-pay | Admitting: Neurology

## 2018-04-21 ENCOUNTER — Ambulatory Visit: Payer: Medicare Other | Admitting: Neurology

## 2018-04-21 NOTE — Telephone Encounter (Signed)
Patient and family instructed to call back to reschedule new patient appt.  Please follow up.

## 2018-04-21 NOTE — Telephone Encounter (Signed)
I have reviewed the referring note, call patient, she was referred by her primary care physician Dr. Deland Pretty for evaluation of fall, right foot drop,  Reviewed most recent hospital discharge on March 23, 2018, she has multiple medical conditions, including coronary artery disease, status post CABG, hyperlipidemia, bronchitis, hypoxic on room air, saturation rate in 80s, scattered bilateral groundglass and small nodular pulmonary opacity, diffuse mosaic attenuation of L space, bilateral bronchial wall thickening,  I have advised her and her son to call our clinic later to reschedule

## 2018-04-23 ENCOUNTER — Other Ambulatory Visit: Payer: Medicare Other

## 2018-05-14 ENCOUNTER — Other Ambulatory Visit: Payer: Medicare Other

## 2018-05-28 ENCOUNTER — Encounter: Payer: Medicare Other | Admitting: Neurology

## 2018-06-04 ENCOUNTER — Institutional Professional Consult (permissible substitution): Payer: Medicare Other | Admitting: Internal Medicine

## 2018-07-02 ENCOUNTER — Other Ambulatory Visit: Payer: Medicare Other

## 2018-08-04 ENCOUNTER — Other Ambulatory Visit: Payer: Medicare Other

## 2018-09-03 DIAGNOSIS — I10 Essential (primary) hypertension: Secondary | ICD-10-CM | POA: Diagnosis not present

## 2018-09-03 DIAGNOSIS — E785 Hyperlipidemia, unspecified: Secondary | ICD-10-CM | POA: Diagnosis not present

## 2018-09-03 DIAGNOSIS — E559 Vitamin D deficiency, unspecified: Secondary | ICD-10-CM | POA: Diagnosis not present

## 2018-09-12 DIAGNOSIS — I10 Essential (primary) hypertension: Secondary | ICD-10-CM | POA: Diagnosis not present

## 2018-09-12 DIAGNOSIS — J189 Pneumonia, unspecified organism: Secondary | ICD-10-CM | POA: Diagnosis not present

## 2018-09-12 DIAGNOSIS — I2510 Atherosclerotic heart disease of native coronary artery without angina pectoris: Secondary | ICD-10-CM | POA: Diagnosis not present

## 2018-09-12 DIAGNOSIS — E785 Hyperlipidemia, unspecified: Secondary | ICD-10-CM | POA: Diagnosis not present

## 2018-09-12 DIAGNOSIS — Z0000 Encounter for general adult medical examination without abnormal findings: Secondary | ICD-10-CM | POA: Diagnosis not present

## 2018-09-15 DIAGNOSIS — H35342 Macular cyst, hole, or pseudohole, left eye: Secondary | ICD-10-CM | POA: Diagnosis not present

## 2018-09-15 DIAGNOSIS — H40013 Open angle with borderline findings, low risk, bilateral: Secondary | ICD-10-CM | POA: Diagnosis not present

## 2018-09-15 DIAGNOSIS — Z961 Presence of intraocular lens: Secondary | ICD-10-CM | POA: Diagnosis not present

## 2018-09-15 DIAGNOSIS — H43813 Vitreous degeneration, bilateral: Secondary | ICD-10-CM | POA: Diagnosis not present

## 2018-09-16 DIAGNOSIS — J189 Pneumonia, unspecified organism: Secondary | ICD-10-CM | POA: Diagnosis not present

## 2018-09-16 DIAGNOSIS — R918 Other nonspecific abnormal finding of lung field: Secondary | ICD-10-CM | POA: Diagnosis not present

## 2018-09-17 DIAGNOSIS — E559 Vitamin D deficiency, unspecified: Secondary | ICD-10-CM | POA: Diagnosis not present

## 2018-09-17 DIAGNOSIS — M810 Age-related osteoporosis without current pathological fracture: Secondary | ICD-10-CM | POA: Diagnosis not present

## 2018-09-18 ENCOUNTER — Other Ambulatory Visit: Payer: Self-pay

## 2018-09-18 ENCOUNTER — Encounter (INDEPENDENT_AMBULATORY_CARE_PROVIDER_SITE_OTHER): Payer: Medicare Other | Admitting: Ophthalmology

## 2018-09-18 DIAGNOSIS — H43813 Vitreous degeneration, bilateral: Secondary | ICD-10-CM | POA: Diagnosis not present

## 2018-09-18 DIAGNOSIS — H35033 Hypertensive retinopathy, bilateral: Secondary | ICD-10-CM

## 2018-09-18 DIAGNOSIS — I10 Essential (primary) hypertension: Secondary | ICD-10-CM

## 2018-09-18 DIAGNOSIS — H353132 Nonexudative age-related macular degeneration, bilateral, intermediate dry stage: Secondary | ICD-10-CM

## 2018-09-18 DIAGNOSIS — H35342 Macular cyst, hole, or pseudohole, left eye: Secondary | ICD-10-CM | POA: Diagnosis not present

## 2018-09-18 NOTE — H&P (Signed)
[name] is an 85 y.o. female.   Chief Complaint:sudden loss of vision left eye HPI: patient noted suddenly that she could not read the paper 3 weeks ago  Past Medical History:  Diagnosis Date  . Arthritis   . Chronic cough   . Coronary artery disease   . Hypercholesteremia   . Internal carotid artery stenosis 08/2012  . Normal cardiac stress test 08/2012   @ Dr Irven Shelling office  . Peripheral vascular disease [facility])     Past Surgical History:  Procedure Laterality Date  . ABDOMINAL HYSTERECTOMY    . APPENDECTOMY    . CAROTID ANGIOGRAM N/A 04/01/2013   Procedure: CAROTID ANGIOGRAM;  Surgeon: Elam Dutch, MD;  Location: [facility] CATH LAB;  Service: Cardiovascular;  Laterality: N/A;  . CAROTID ENDARTERECTOMY Right August 25, 2012   cea  . CAROTID STENT INSERTION Right 04/22/2013   Procedure: CAROTID STENT INSERTION;  Surgeon: Elam Dutch, MD;  Location: [facility] CATH LAB;  Service: Cardiovascular;  Laterality: Right;  . CORONARY ARTERY BYPASS GRAFT    . ENDARTERECTOMY Right 08/25/2012   Procedure: ENDARTERECTOMY CAROTID;  Surgeon: Elam Dutch, MD;  Location: [place];  Service: Vascular;  Laterality: Right;  . EYE SURGERY     both cataracts  . FRACTURE SURGERY Left Jun 14, 2011   Ankle  . ORIF ANKLE FRACTURE  06/14/2011   Procedure: OPEN REDUCTION INTERNAL FIXATION (ORIF) ANKLE FRACTURE;  Surgeon: Ninetta Lights, MD;  Location: [place];  Service: Orthopedics;  Laterality: Left;  left ankle fracture open treatment trimalleolar ankle includes internal fixation WITHOUT fixation of posterior lip  . SHOULDER ARTHROSCOPY WITH SUBACROMIAL DECOMPRESSION, ROTATOR CUFF REPAIR AND BICEP TENDON REPAIR Left 06/05/2012   Procedure: LEFT SHOULDER ARTHROSCOPY WITH SUBACROMIAL DECOMPRESSION, PARTIAL ACROMIOPLASTY WITH CORACOACROMIAL RELEASE, DISTAL CLAVICULECTOMY, WITH ROTATOR CUFF REPAIR AND BICEP TENODESIS;  Surgeon: Ninetta Lights, MD;  Location: [place];   Service: Orthopedics;  Laterality: Left;    Family History  Problem Relation Age of Onset  . Cancer Father        Brain   . Diabetes Sister   . Heart disease Sister        after age 86  . Hyperlipidemia Sister   . Heart disease Brother        After age 60  . Cancer Sister    Social History:  reports that she has never smoked. She has never used smokeless tobacco. She reports that she does not drink alcohol or use drugs.  Allergies:  Allergies  Allergen Reactions  . Hydrocodone Shortness Of Breath    Rash-itching-tongue swelled  . Fenofibrate Other (See Comments)    Caused every joint to become stif  . Oxycodone Swelling    Rash-itching  . Latex Rash and Other (See Comments)    "Blisters in mouth"    No medications prior to admission.    Review of systems otherwise negative  There were no vitals taken for this visit.  Physical exam: Mental status: oriented x3. Eyes: See eye exam associated with this date of surgery in media tab.  Scanned in by scanning center Ears, Nose, Throat: within normal limits Neck: Within Normal limits General: within normal limits Chest: Within normal limits Breast: deferred Heart: Within normal limits Abdomen: Within normal limits GU: deferred Extremities: within normal limits Skin: within normal limits  Assessment/Plan Macular hole left eye Plan: To  [name] for Pars plana vitrectomy, internal limiting membrane peel, serum patch, gas  injection, laser left eye.    [name] 09/18/2018, 4:38 PM

## 2018-09-29 ENCOUNTER — Other Ambulatory Visit: Payer: Self-pay

## 2018-09-29 ENCOUNTER — Encounter: Payer: Self-pay | Admitting: Cardiology

## 2018-09-29 ENCOUNTER — Ambulatory Visit (INDEPENDENT_AMBULATORY_CARE_PROVIDER_SITE_OTHER): Payer: Medicare Other | Admitting: Cardiology

## 2018-09-29 VITALS — BP 145/66 | HR 59 | Temp 98.0°F | Ht 62.0 in | Wt 140.0 lb

## 2018-09-29 DIAGNOSIS — I2510 Atherosclerotic heart disease of native coronary artery without angina pectoris: Secondary | ICD-10-CM

## 2018-09-29 DIAGNOSIS — R0600 Dyspnea, unspecified: Secondary | ICD-10-CM

## 2018-09-29 DIAGNOSIS — R0609 Other forms of dyspnea: Secondary | ICD-10-CM | POA: Diagnosis not present

## 2018-09-29 DIAGNOSIS — I6523 Occlusion and stenosis of bilateral carotid arteries: Secondary | ICD-10-CM | POA: Diagnosis not present

## 2018-09-29 DIAGNOSIS — I10 Essential (primary) hypertension: Secondary | ICD-10-CM | POA: Diagnosis not present

## 2018-09-29 MED ORDER — HYDROCHLOROTHIAZIDE 12.5 MG PO CAPS: 12.5000 mg | ORAL_CAPSULE | Freq: Every day | ORAL | 0 refills | Status: DC

## 2018-09-29 MED ORDER — LOSARTAN POTASSIUM-HCTZ 100-12.5 MG PO TABS: 1.0000 | ORAL_TABLET | Freq: Every day | ORAL | 1 refills | Status: DC

## 2018-09-29 NOTE — Progress Notes (Signed)
Primary Physician:  [name], MD   Patient ID: [name], female    DOB: January 19, 1934, 85 y.o.   MRN: [id number]  Subjective:    Chief Complaint  Patient presents with  . dyspnea on exertion    HPI: [name]  is a 85 y.o. female  with history of known coronary artery disease and has had CABG in 2007, has asymptomatic carotid artery stenosis, underwent right carotid endarterectomy by Dr. Ruta Hinds in July 2014, was found to have restenosis and she underwent right carotid artery stenting on 04/11/2013.  Patient was admitted to the hospital for acute hypoxemia respiratory failure and bronchitis on 03/21/2018. CTA of the chest was negative for PE, but was noted to have numerous scattered bilateral groundglass and small nodular opacities, diffuse mosaic attenuation of airspaces and mild bilateral bronchial wall thickening. Echo was also performed that revealed normal LVEF and severe basal septal hypertrophy. At her last office visit, shortness of breath had improved.  She is now here for 6 month follow up. She continues to have dyspnea on exertion, but feels that this is stable. She is overall feeling well. No chest pain. No complaints today. She is have left eye procedure on Sept 1 for vision changes related to macular changes.   She does report a few falls over the last few months. States after one fall, she had some numbness in her right ankle area that resolved after about 2 months. She has noticed sometimes some right leg weakness, but no symptoms in her hands.   Past Medical History:  Diagnosis Date  . Arthritis   . Chronic cough   . Coronary artery disease   . Hypercholesteremia   . Internal carotid artery stenosis 08/2012  . Normal cardiac stress test 08/2012   @ Dr Irven Shelling office  . Peripheral vascular disease [facility])     Past Surgical History:  Procedure Laterality Date  . ABDOMINAL HYSTERECTOMY    . APPENDECTOMY    . CAROTID  ANGIOGRAM N/A 04/01/2013   Procedure: CAROTID ANGIOGRAM;  Surgeon: Elam Dutch, MD;  Location: [facility] CATH LAB;  Service: Cardiovascular;  Laterality: N/A;  . CAROTID ENDARTERECTOMY Right August 25, 2012   cea  . CAROTID STENT INSERTION Right 04/22/2013   Procedure: CAROTID STENT INSERTION;  Surgeon: Elam Dutch, MD;  Location: [facility] CATH LAB;  Service: Cardiovascular;  Laterality: Right;  . CORONARY ARTERY BYPASS GRAFT    . ENDARTERECTOMY Right 08/25/2012   Procedure: ENDARTERECTOMY CAROTID;  Surgeon: Elam Dutch, MD;  Location: [place];  Service: Vascular;  Laterality: Right;  . EYE SURGERY     both cataracts  . FRACTURE SURGERY Left Jun 14, 2011   Ankle  . ORIF ANKLE FRACTURE  06/14/2011   Procedure: OPEN REDUCTION INTERNAL FIXATION (ORIF) ANKLE FRACTURE;  Surgeon: Ninetta Lights, MD;  Location: [place];  Service: Orthopedics;  Laterality: Left;  left ankle fracture open treatment trimalleolar ankle includes internal fixation WITHOUT fixation of posterior lip  . SHOULDER ARTHROSCOPY WITH SUBACROMIAL DECOMPRESSION, ROTATOR CUFF REPAIR AND BICEP TENDON REPAIR Left 06/05/2012   Procedure: LEFT SHOULDER ARTHROSCOPY WITH SUBACROMIAL DECOMPRESSION, PARTIAL ACROMIOPLASTY WITH CORACOACROMIAL RELEASE, DISTAL CLAVICULECTOMY, WITH ROTATOR CUFF REPAIR AND BICEP TENODESIS;  Surgeon: Ninetta Lights, MD;  Location: [place];  Service: Orthopedics;  Laterality: Left;    Social History   Socioeconomic History  . Marital status: Widowed    Spouse name: Not on file  . Number of  children: 1  . Years of education: Not on file  . Highest education level: Not on file  Occupational History  . Not on file  Social Needs  . Financial resource strain: Not on file  . Food insecurity    Worry: Not on file    Inability: Not on file  . Transportation needs    Medical: Not on file    Non-medical: Not on file  Tobacco Use  . Smoking status: Never Smoker  . Smokeless tobacco:  Never Used  Substance and Sexual Activity  . Alcohol use: No  . Drug use: No  . Sexual activity: Never  Lifestyle  . Physical activity    Days per week: Not on file    Minutes per session: Not on file  . Stress: Not on file  Relationships  . Social Herbalist on phone: Not on file    Gets together: Not on file    Attends religious service: Not on file    Active member of club or organization: Not on file    Attends meetings of clubs or organizations: Not on file    Relationship status: Not on file  . Intimate partner violence    Fear of current or ex partner: Not on file    Emotionally abused: Not on file    Physically abused: Not on file    Forced sexual activity: Not on file  Other Topics Concern  . Not on file  Social History Narrative  . Not on file    Review of Systems  Constitution: Negative for decreased appetite, malaise/fatigue, weight gain and weight loss.  Eyes: Negative for visual disturbance.  Cardiovascular: Positive for dyspnea on exertion. Negative for chest pain, claudication, leg swelling, orthopnea, palpitations and syncope.  Respiratory: Positive for cough. Negative for hemoptysis and wheezing.   Endocrine: Negative for cold intolerance and heat intolerance.  Hematologic/Lymphatic: Does not bruise/bleed easily.  Skin: Negative for nail changes.  Musculoskeletal: Negative for muscle weakness and myalgias.  Gastrointestinal: Negative for abdominal pain, change in bowel habit, nausea and vomiting.  Neurological: Negative for difficulty with concentration, dizziness, focal weakness and headaches.  Psychiatric/Behavioral: Negative for altered mental status and suicidal ideas.  All other systems reviewed and are negative.     Objective:  Blood pressure (!) 145/66, pulse (!) 59, temperature 98 F (36.7 C), height 5\' 2"  (1.575 m), weight 140 lb (63.5 kg), SpO2 94 %. Body mass index is 25.61 kg/m.    Physical Exam  Constitutional: She is oriented  to person, place, and time. Vital signs are normal. She appears well-developed and well-nourished.  HENT:  Head: Normocephalic and atraumatic.  Neck: Normal range of motion.  Cardiovascular: Normal rate, regular rhythm, normal heart sounds and intact distal pulses.  Pulses:      Carotid pulses are on the right side with bruit and on the left side with bruit.      Femoral pulses are 2+ on the right side and 2+ on the left side.      Popliteal pulses are 2+ on the right side and 2+ on the left side.       Dorsalis pedis pulses are 1+ on the right side and 1+ on the left side.       Posterior tibial pulses are 1+ on the right side and 1+ on the left side.  Physiological split S2  Pulmonary/Chest: Effort normal and breath sounds normal. No accessory muscle usage. No respiratory distress.  Abdominal:  Soft. Bowel sounds are normal.  Musculoskeletal: Normal range of motion.  Neurological: She is alert and oriented to person, place, and time.  Skin: Skin is warm and dry.  Vitals reviewed.  Radiology: No results found.  Laboratory examination:    CMP Latest Ref Rng & Units 03/23/2018 03/22/2018 03/21/2018  Glucose 70 - 99 mg/dL 84 85 -  BUN 8 - 23 mg/dL 13 13 -  Creatinine 0.44 - 1.00 mg/dL 0.79 0.85 -  Sodium 135 - 145 mmol/L 136 139 135  Potassium 3.5 - 5.1 mmol/L 4.4 5.1 4.1  Chloride 98 - 111 mmol/L 100 104 -  CO2 22 - 32 mmol/L 33(H) 29 -  Calcium 8.9 - 10.3 mg/dL 8.6(L) 8.7(L) -  Total Protein 6.5 - 8.1 g/dL - - -  Total Bilirubin 0.3 - 1.2 mg/dL - - -  Alkaline Phos 38 - 126 U/L - - -  AST 15 - 41 U/L - - -  ALT 0 - 44 U/L - - -   CBC Latest Ref Rng & Units 03/23/2018 03/22/2018 03/21/2018  WBC 4.0 - 10.5 K/uL 6.0 6.2 -  Hemoglobin 12.0 - 15.0 g/dL 11.9(L) 12.1 12.2  Hematocrit 36.0 - 46.0 % 37.4 38.2 36.0  Platelets 150 - 400 K/uL 276 298 -   Lipid Panel  No results found for: CHOL, TRIG, HDL, CHOLHDL, VLDL, LDLCALC, LDLDIRECT HEMOGLOBIN A1C No results found for: HGBA1C, MPG  TSH No results for input(s): TSH in the last 8760 hours.  PRN Meds:. Medications Discontinued During This Encounter  Medication Reason  . citalopram (CELEXA) 20 MG tablet Error  . doxycycline (VIBRA-TABS) 100 MG tablet Error  . Multiple Vitamins-Minerals (PRESERVISION AREDS PO) Error   Current Meds  Medication Sig  . aspirin 81 MG tablet Take 81 mg by mouth daily.  . Cholecalciferol (VITAMIN D3 PO) Take 3 capsules by mouth daily.  Marland Kitchen denosumab (PROLIA) 60 MG/ML SOSY injection Inject 60 mg into the skin every 6 (six) months. August 2019  . ezetimibe (ZETIA) 10 MG tablet Take 10 mg by mouth every other day.   . losartan (COZAAR) 100 MG tablet Take 100 mg by mouth daily.  . metoprolol tartrate (LOPRESSOR) 25 MG tablet Take 1 tablet (25 mg total) by mouth 2 (two) times daily.  . rosuvastatin (CRESTOR) 20 MG tablet Take 20 mg by mouth every other day.     Cardiac Studies:   Echocardiogram 03/22/2018:  1. The left ventricle has normal systolic function, with an ejection fraction of 55-60%. The cavity size was normal. Severe Basal Septal hypertrophy. Left ventricular diastolic Doppler parameters are indeterminate due to nondiagnostic images. 2. The right ventricle has normal systolic function. The cavity was normal. There is no increase in right ventricular wall thickness. 3. The mitral valve is normal in structure. There is mild mitral annular calcification present. 4. The tricuspid valve is normal in structure. 5. The aortic valve is tricuspid Mild sclerosis of the aortic valve. 6. The pulmonic valve was normal in structure. 7. Right atrial pressure is estimated at 3 mmHg. 8. The interatrial septum appears to be lipomatous. 9. When compared to the prior study: No prior Echo to compare.  CTA of chest 03/21/2018: 1. Negative examination for pulmonary embolism. 2. There are numerous scattered bilateral ground-glass and small nodular pulmonary opacities. Diffuse mosaic  attenuation of the airspaces and mild bilateral bronchial wall thickening. Findings are nonspecific and infectious or inflammatory. Given the presence of nodularity, recommend follow-up CT examination at 3 months to  evaluate for resolution. 3. Coronary artery disease.  Lexiscan Myoview stress test 08/18/2012:  1. Resting EKG NSR, Poor R wave progression. Stress EKG was non diagnostic for ischemia. No ST-T changes of ischemia noted with pharmacologic stress testing.  2. The perfusion study demonstrated normal isotope uptake both at rest and stress. There was no evidence of ischemia or scar. Dynamic gated images reveal normal wall motion and endocardial thickening. Left ventricular ejection fraction was estimated to be 88%.  3. Based on the study, patient can be taken up for surgery with acceptable (low) CV morbidity and mortality. Results are faxed to the Dr. Ruta Hinds who has schedule her for carotid endarterectomy.   Assessment:   Dyspnea on exertion - Plan: EKG 12-Lead  Coronary artery disease involving native coronary artery of native heart without angina pectoris  Carotid stenosis, bilateral  Essential hypertension  EKG 09/29/2018: Normal sinus rhythm at 60 bpm, left atrial enlargement, no evidence of ischemia.   Recommendations:   Patient continues to have dyspnea on exertion that is unchanged compared to 6 months ago.  She overall feels that she is doing well.  She has not had any stress test since 2014; however, as she overall feels that she is doing well, hold off on further cardiac work-up for now.  No chest pain.  She is on appropriate medical therapy.  She is noted be hypertensive, which could be contributing to her dyspnea.  I will add hydrochlorothiazide to her losartan.  Will check BMP in 10 days for surveillance of kidney function.  No clinical evidence of heart failure.  She does continue to have bilateral carotid stenosis.  She is previously been followed by Dr.  Oneida Alar, but has not seen him since last year.  She is unaware of upcoming appointment.  Does not appear by previous carotid duplex that she has any significant carotid stenosis, and could continue with as needed carotid duplex.  We will be seeing her regularly and can continue to follow this.  I will see her back in 6 weeks for follow-up on hypertension.   *I have discussed this case with Dr. Einar Gip and he personally examined the patient and participated in formulating the plan.*   Miquel Dunn, MSN, APRN, FNP-C [facility] Cardiovascular. [place] Office: [phone] Fax: [phone]

## 2018-10-03 ENCOUNTER — Inpatient Hospital Stay (HOSPITAL_COMMUNITY)
Admission: RE | Admit: 2018-10-03 | Discharge: 2018-10-03 | Disposition: A | Discharge status: Home / Self Care | Payer: Medicare Other | Source: Ambulatory Visit

## 2018-10-03 NOTE — Progress Notes (Signed)
No show for 0845 pre-procedure COVID testing appointment, left message for patient with instructions that the drive thru hours are until 1500 this afternoon.  Left call back number and instructed patient to return call if not about to make appointment

## 2018-10-06 ENCOUNTER — Other Ambulatory Visit: Payer: Self-pay

## 2018-10-06 ENCOUNTER — Encounter (HOSPITAL_COMMUNITY): Payer: Self-pay | Admitting: *Deleted

## 2018-10-06 DIAGNOSIS — Z1159 Encounter for screening for other viral diseases: Secondary | ICD-10-CM | POA: Diagnosis not present

## 2018-10-06 NOTE — Progress Notes (Signed)
Spoke with pt for pre-op call. Pt has extensive cardiac history with CABG in 2007. Pt's cardiologist is Dr. Einar Gip, last OV was 09/29/18. Pt denies any recent chest pain or sob. Pt states she is not diabetic. Pt had her Covid test done on Saturday at her PCP's office and she will bring results with her in the AM. Pt states she has been in quarantine since the test was done.    Coronavirus Screening  Have you experienced the following symptoms:  Cough yes/no: No Fever (>100.61F)  yes/no: No Runny nose yes/no: No Sore throat yes/no: No Difficulty breathing/shortness of breath  yes/no: No  Have you or a family member traveled in the last 14 days and where? yes/no: No   Patient reminded that hospital visitation restrictions are in effect and the importance of the restrictions. Pt informed that she may have 1 visitor wait in the waiting room while she is in pre-op, surgery and PACU. That 1 visitor can visit during visiting hours once pt is admitted.

## 2018-10-06 NOTE — Progress Notes (Signed)
Patient stated that she missed her Covid appointment on Friday and that her PCP had her tested and sent to [facility].  She stated that she will fax over her results and will bring a copy DOS.

## 2018-10-07 ENCOUNTER — Encounter (HOSPITAL_COMMUNITY)
Admission: RE | Disposition: A | Discharge status: Home / Self Care | Payer: Self-pay | Source: Home / Self Care | Attending: Ophthalmology

## 2018-10-07 ENCOUNTER — Ambulatory Visit (HOSPITAL_COMMUNITY)
Admission: RE | Admit: 2018-10-07 | Discharge: 2018-10-08 | Disposition: A | Discharge status: Home / Self Care | Payer: Medicare Other | Attending: Ophthalmology | Admitting: Ophthalmology

## 2018-10-07 ENCOUNTER — Other Ambulatory Visit: Payer: Self-pay

## 2018-10-07 ENCOUNTER — Ambulatory Visit (HOSPITAL_COMMUNITY): Payer: Medicare Other | Admitting: Certified Registered Nurse Anesthetist

## 2018-10-07 ENCOUNTER — Encounter (HOSPITAL_COMMUNITY): Payer: Self-pay

## 2018-10-07 DIAGNOSIS — I10 Essential (primary) hypertension: Secondary | ICD-10-CM | POA: Diagnosis not present

## 2018-10-07 DIAGNOSIS — Z8249 Family history of ischemic heart disease and other diseases of the circulatory system: Secondary | ICD-10-CM | POA: Insufficient documentation

## 2018-10-07 DIAGNOSIS — Z885 Allergy status to narcotic agent status: Secondary | ICD-10-CM | POA: Diagnosis not present

## 2018-10-07 DIAGNOSIS — H35342 Macular cyst, hole, or pseudohole, left eye: Secondary | ICD-10-CM | POA: Insufficient documentation

## 2018-10-07 DIAGNOSIS — I2510 Atherosclerotic heart disease of native coronary artery without angina pectoris: Secondary | ICD-10-CM | POA: Diagnosis not present

## 2018-10-07 DIAGNOSIS — Z888 Allergy status to other drugs, medicaments and biological substances status: Secondary | ICD-10-CM | POA: Insufficient documentation

## 2018-10-07 DIAGNOSIS — F329 Major depressive disorder, single episode, unspecified: Secondary | ICD-10-CM | POA: Diagnosis not present

## 2018-10-07 DIAGNOSIS — Z833 Family history of diabetes mellitus: Secondary | ICD-10-CM | POA: Diagnosis not present

## 2018-10-07 DIAGNOSIS — Z808 Family history of malignant neoplasm of other organs or systems: Secondary | ICD-10-CM | POA: Insufficient documentation

## 2018-10-07 DIAGNOSIS — E7800 Pure hypercholesterolemia, unspecified: Secondary | ICD-10-CM | POA: Insufficient documentation

## 2018-10-07 DIAGNOSIS — Z809 Family history of malignant neoplasm, unspecified: Secondary | ICD-10-CM | POA: Diagnosis not present

## 2018-10-07 DIAGNOSIS — Z90710 Acquired absence of both cervix and uterus: Secondary | ICD-10-CM | POA: Diagnosis not present

## 2018-10-07 DIAGNOSIS — I739 Peripheral vascular disease, unspecified: Secondary | ICD-10-CM | POA: Diagnosis not present

## 2018-10-07 DIAGNOSIS — Z91040 Latex allergy status: Secondary | ICD-10-CM | POA: Insufficient documentation

## 2018-10-07 DIAGNOSIS — Z951 Presence of aortocoronary bypass graft: Secondary | ICD-10-CM | POA: Insufficient documentation

## 2018-10-07 DIAGNOSIS — M1990 Unspecified osteoarthritis, unspecified site: Secondary | ICD-10-CM | POA: Insufficient documentation

## 2018-10-07 HISTORY — PX: 25 GAUGE PARS PLANA VITRECTOMY WITH 20 GAUGE MVR PORT FOR MACULAR HOLE: SHX6096

## 2018-10-07 HISTORY — PX: MEMBRANE PEEL: SHX5967

## 2018-10-07 HISTORY — DX: Essential (primary) hypertension: I10

## 2018-10-07 HISTORY — PX: GAS INSERTION: SHX5336

## 2018-10-07 HISTORY — PX: PHOTOCOAGULATION WITH LASER: SHX6027

## 2018-10-07 HISTORY — PX: SERUM PATCH: SHX6091

## 2018-10-07 HISTORY — DX: Diverticulitis of intestine, part unspecified, without perforation or abscess without bleeding: K57.92

## 2018-10-07 LAB — CBC
HCT: 41.1 % (ref 36.0–46.0)
Hemoglobin: 13.6 g/dL (ref 12.0–15.0)
MCH: 33.1 pg (ref 26.0–34.0)
MCHC: 33.1 g/dL (ref 30.0–36.0)
MCV: 100 fL (ref 80.0–100.0)
Platelets: 222 10*3/uL (ref 150–400)
RBC: 4.11 MIL/uL (ref 3.87–5.11)
RDW: 12.1 % (ref 11.5–15.5)
WBC: 5.1 10*3/uL (ref 4.0–10.5)
nRBC: 0 % (ref 0.0–0.2)

## 2018-10-07 LAB — BASIC METABOLIC PANEL
Anion gap: 12 (ref 5–15)
BUN: 14 mg/dL (ref 8–23)
CO2: 27 mmol/L (ref 22–32)
Calcium: 9.2 mg/dL (ref 8.9–10.3)
Chloride: 98 mmol/L (ref 98–111)
Creatinine, Ser: 0.77 mg/dL (ref 0.44–1.00)
GFR calc Af Amer: >60 mL/min (ref >60)
GFR calc non Af Amer: >60 mL/min (ref >60)
Glucose, Bld: 103 mg/dL — ABNORMAL HIGH (ref 70–99)
Potassium: 3.8 mmol/L (ref 3.5–5.1)
Sodium: 137 mmol/L (ref 135–145)

## 2018-10-07 LAB — AUTOLOGOUS SERUM PATCH PREP

## 2018-10-07 SURGERY — 25 GAUGE PARS PLANA VITRECTOMY WITH 20 GAUGE MVR PORT FOR MACULAR HOLE
Anesthesia: General | Site: Eye | Laterality: Left

## 2018-10-07 MED ORDER — GATIFLOXACIN 0.5 % OP SOLN
1.0000 [drp] | OPHTHALMIC | Status: AC | PRN
  Administered 2018-10-07 (×3): 1 [drp] via OPHTHALMIC
  Filled 2018-10-07: qty 2.5

## 2018-10-07 MED ORDER — SODIUM HYALURONATE 10 MG/ML IO SOLN
INTRAOCULAR | Status: DC | PRN
  Administered 2018-10-07 (×2): 0.85 mL via INTRAOCULAR

## 2018-10-07 MED ORDER — ACETAMINOPHEN 325 MG PO TABS
325.0000 mg | ORAL_TABLET | ORAL | Status: DC | PRN
  Administered 2018-10-07 (×3): 650 mg via ORAL
  Filled 2018-10-07 (×2): qty 2

## 2018-10-07 MED ORDER — EPINEPHRINE PF 1 MG/ML IJ SOLN
INTRAOCULAR | Status: DC | PRN
  Administered 2018-10-07: 500.3 mL

## 2018-10-07 MED ORDER — ACETAZOLAMIDE SODIUM 500 MG IJ SOLR
INTRAMUSCULAR | Status: AC
  Filled 2018-10-07: qty 500

## 2018-10-07 MED ORDER — TEMAZEPAM 15 MG PO CAPS: 15.0000 mg | ORAL_CAPSULE | Freq: Every evening | ORAL | Status: DC | PRN

## 2018-10-07 MED ORDER — TETRACAINE HCL 0.5 % OP SOLN
2.0000 [drp] | Freq: Once | OPHTHALMIC | Status: DC
  Filled 2018-10-07: qty 4

## 2018-10-07 MED ORDER — EPHEDRINE 5 MG/ML INJ
INTRAVENOUS | Status: AC
  Filled 2018-10-07: qty 10

## 2018-10-07 MED ORDER — SODIUM CHLORIDE 0.45 % IV SOLN: INTRAVENOUS | Status: DC

## 2018-10-07 MED ORDER — LOSARTAN POTASSIUM 50 MG PO TABS
100.0000 mg | ORAL_TABLET | Freq: Every day | ORAL | Status: DC
  Administered 2018-10-07: 100 mg via ORAL
  Filled 2018-10-07: qty 2

## 2018-10-07 MED ORDER — HEMOSTATIC AGENTS (NO CHARGE) OPTIME
TOPICAL | Status: DC | PRN
  Administered 2018-10-07: 1 via TOPICAL

## 2018-10-07 MED ORDER — EPINEPHRINE PF 1 MG/ML IJ SOLN
INTRAMUSCULAR | Status: AC
  Filled 2018-10-07: qty 1

## 2018-10-07 MED ORDER — HYPROMELLOSE (GONIOSCOPIC) 2.5 % OP SOLN
OPHTHALMIC | Status: AC
  Filled 2018-10-07: qty 15

## 2018-10-07 MED ORDER — DEXAMETHASONE SODIUM PHOSPHATE 10 MG/ML IJ SOLN
INTRAMUSCULAR | Status: AC
  Filled 2018-10-07: qty 1

## 2018-10-07 MED ORDER — PHENYLEPHRINE HCL 2.5 % OP SOLN
1.0000 [drp] | OPHTHALMIC | Status: AC | PRN
  Administered 2018-10-07 (×3): 1 [drp] via OPHTHALMIC
  Filled 2018-10-07: qty 2

## 2018-10-07 MED ORDER — PREDNISOLONE ACETATE 1 % OP SUSP
1.0000 [drp] | Freq: Four times a day (QID) | OPHTHALMIC | Status: DC
  Filled 2018-10-07: qty 5

## 2018-10-07 MED ORDER — ACETAMINOPHEN 500 MG PO TABS: 500.0000 mg | ORAL_TABLET | Freq: Four times a day (QID) | ORAL | Status: DC | PRN

## 2018-10-07 MED ORDER — PROPOFOL 10 MG/ML IV BOLUS
INTRAVENOUS | Status: DC | PRN
  Administered 2018-10-07: 120 mg via INTRAVENOUS

## 2018-10-07 MED ORDER — FENTANYL CITRATE (PF) 250 MCG/5ML IJ SOLN
INTRAMUSCULAR | Status: DC | PRN
  Administered 2018-10-07 (×2): 25 ug via INTRAVENOUS

## 2018-10-07 MED ORDER — SODIUM CHLORIDE 0.9 % IV SOLN
INTRAVENOUS | Status: DC | PRN
  Administered 2018-10-07: 25 ug/min via INTRAVENOUS

## 2018-10-07 MED ORDER — BUPIVACAINE-EPINEPHRINE (PF) 0.25% -1:200000 IJ SOLN
INTRAMUSCULAR | Status: AC
  Filled 2018-10-07: qty 30

## 2018-10-07 MED ORDER — ROCURONIUM BROMIDE 10 MG/ML (PF) SYRINGE
PREFILLED_SYRINGE | INTRAVENOUS | Status: AC
  Filled 2018-10-07: qty 10

## 2018-10-07 MED ORDER — CITALOPRAM HYDROBROMIDE 20 MG PO TABS
20.0000 mg | ORAL_TABLET | Freq: Every day | ORAL | Status: DC
  Administered 2018-10-07: 18:00:00 20 mg via ORAL
  Filled 2018-10-07: qty 1

## 2018-10-07 MED ORDER — DEXAMETHASONE SODIUM PHOSPHATE 10 MG/ML IJ SOLN
INTRAMUSCULAR | Status: DC | PRN
  Administered 2018-10-07: 5 mg via INTRAVENOUS

## 2018-10-07 MED ORDER — BACITRACIN-POLYMYXIN B 500-10000 UNIT/GM OP OINT
1.0000 "application " | TOPICAL_OINTMENT | Freq: Three times a day (TID) | OPHTHALMIC | Status: DC
  Filled 2018-10-07: qty 3.5

## 2018-10-07 MED ORDER — STERILE WATER FOR INJECTION IJ SOLN
INTRAMUSCULAR | Status: AC
  Filled 2018-10-07: qty 20

## 2018-10-07 MED ORDER — PHENYLEPHRINE 40 MCG/ML (10ML) SYRINGE FOR IV PUSH (FOR BLOOD PRESSURE SUPPORT)
PREFILLED_SYRINGE | INTRAVENOUS | Status: DC | PRN
  Administered 2018-10-07 (×2): 80 ug via INTRAVENOUS

## 2018-10-07 MED ORDER — ONDANSETRON HCL 4 MG/2ML IJ SOLN
INTRAMUSCULAR | Status: DC | PRN
  Administered 2018-10-07: 4 mg via INTRAVENOUS

## 2018-10-07 MED ORDER — ONDANSETRON HCL 4 MG/2ML IJ SOLN: 4.0000 mg | Freq: Four times a day (QID) | INTRAMUSCULAR | Status: DC

## 2018-10-07 MED ORDER — BUPIVACAINE HCL (PF) 0.75 % IJ SOLN
INTRAMUSCULAR | Status: DC | PRN
  Administered 2018-10-07: 10 mL

## 2018-10-07 MED ORDER — LOSARTAN POTASSIUM-HCTZ 100-12.5 MG PO TABS: 1.0000 | ORAL_TABLET | Freq: Every day | ORAL | Status: DC

## 2018-10-07 MED ORDER — BRIMONIDINE TARTRATE 0.15 % OP SOLN
1.0000 [drp] | OPHTHALMIC | Status: AC
  Administered 2018-10-07: 1 [drp] via OPHTHALMIC
  Filled 2018-10-07: qty 5

## 2018-10-07 MED ORDER — GATIFLOXACIN 0.5 % OP SOLN
1.0000 [drp] | Freq: Four times a day (QID) | OPHTHALMIC | Status: DC
  Filled 2018-10-07: qty 2.5

## 2018-10-07 MED ORDER — BSS IO SOLN
INTRAOCULAR | Status: AC
  Filled 2018-10-07: qty 15

## 2018-10-07 MED ORDER — ACETAMINOPHEN 325 MG PO TABS
ORAL_TABLET | ORAL | Status: AC
  Administered 2018-10-07: 650 mg via ORAL
  Filled 2018-10-07: qty 2

## 2018-10-07 MED ORDER — LATANOPROST 0.005 % OP SOLN
1.0000 [drp] | Freq: Every day | OPHTHALMIC | Status: DC
  Filled 2018-10-07: qty 2.5

## 2018-10-07 MED ORDER — BRIMONIDINE TARTRATE 0.2 % OP SOLN
1.0000 [drp] | Freq: Two times a day (BID) | OPHTHALMIC | Status: DC
  Filled 2018-10-07: qty 5

## 2018-10-07 MED ORDER — PHENYLEPHRINE 40 MCG/ML (10ML) SYRINGE FOR IV PUSH (FOR BLOOD PRESSURE SUPPORT)
PREFILLED_SYRINGE | INTRAVENOUS | Status: AC
  Filled 2018-10-07: qty 10

## 2018-10-07 MED ORDER — SODIUM CHLORIDE (PF) 0.9 % IJ SOLN
INTRAMUSCULAR | Status: AC
  Filled 2018-10-07: qty 10

## 2018-10-07 MED ORDER — BACITRACIN-POLYMYXIN B 500-10000 UNIT/GM OP OINT
TOPICAL_OINTMENT | OPHTHALMIC | Status: AC
  Filled 2018-10-07: qty 3.5

## 2018-10-07 MED ORDER — ATROPINE SULFATE 1 % OP SOLN
OPHTHALMIC | Status: AC
  Filled 2018-10-07: qty 5

## 2018-10-07 MED ORDER — DEXAMETHASONE SODIUM PHOSPHATE 10 MG/ML IJ SOLN
INTRAMUSCULAR | Status: DC | PRN
  Administered 2018-10-07: 10 mg

## 2018-10-07 MED ORDER — CYCLOPENTOLATE HCL 1 % OP SOLN
1.0000 [drp] | OPHTHALMIC | Status: AC | PRN
  Administered 2018-10-07 (×3): 1 [drp] via OPHTHALMIC
  Filled 2018-10-07: qty 2

## 2018-10-07 MED ORDER — ACETAZOLAMIDE SODIUM 500 MG IJ SOLR
500.0000 mg | Freq: Once | INTRAMUSCULAR | Status: AC
  Administered 2018-10-08: 500 mg via INTRAVENOUS
  Filled 2018-10-07: qty 500

## 2018-10-07 MED ORDER — EZETIMIBE 10 MG PO TABS
10.0000 mg | ORAL_TABLET | ORAL | Status: DC
  Filled 2018-10-07: qty 1

## 2018-10-07 MED ORDER — EPHEDRINE SULFATE-NACL 50-0.9 MG/10ML-% IV SOSY
PREFILLED_SYRINGE | INTRAVENOUS | Status: DC | PRN
  Administered 2018-10-07: 10 mg via INTRAVENOUS
  Administered 2018-10-07 (×2): 5 mg via INTRAVENOUS

## 2018-10-07 MED ORDER — CEFAZOLIN SODIUM-DEXTROSE 2-4 GM/100ML-% IV SOLN
2.0000 g | INTRAVENOUS | Status: AC
  Administered 2018-10-07: 2 g via INTRAVENOUS
  Filled 2018-10-07: qty 100

## 2018-10-07 MED ORDER — METOPROLOL TARTRATE 25 MG PO TABS
25.0000 mg | ORAL_TABLET | Freq: Two times a day (BID) | ORAL | Status: DC
  Administered 2018-10-07: 21:00:00 25 mg via ORAL
  Filled 2018-10-07: qty 1

## 2018-10-07 MED ORDER — STERILE WATER FOR INJECTION IJ SOLN
INTRAMUSCULAR | Status: DC | PRN
  Administered 2018-10-07: 12:00:00 20 mL

## 2018-10-07 MED ORDER — PROPOFOL 10 MG/ML IV BOLUS
INTRAVENOUS | Status: AC
  Filled 2018-10-07: qty 20

## 2018-10-07 MED ORDER — MAGNESIUM HYDROXIDE 400 MG/5ML PO SUSP: 15.0000 mL | Freq: Four times a day (QID) | ORAL | Status: DC | PRN

## 2018-10-07 MED ORDER — LIDOCAINE HCL 2 % IJ SOLN
INTRAMUSCULAR | Status: AC
  Filled 2018-10-07: qty 20

## 2018-10-07 MED ORDER — PROPOFOL 500 MG/50ML IV EMUL
INTRAVENOUS | Status: DC | PRN
  Administered 2018-10-07: 25 ug/kg/min via INTRAVENOUS

## 2018-10-07 MED ORDER — ONDANSETRON HCL 4 MG/2ML IJ SOLN
INTRAMUSCULAR | Status: AC
  Filled 2018-10-07: qty 2

## 2018-10-07 MED ORDER — HYALURONIDASE HUMAN 150 UNIT/ML IJ SOLN
INTRAMUSCULAR | Status: AC
  Filled 2018-10-07: qty 1

## 2018-10-07 MED ORDER — BSS PLUS IO SOLN
INTRAOCULAR | Status: AC
  Filled 2018-10-07: qty 500

## 2018-10-07 MED ORDER — BACITRACIN-POLYMYXIN B 500-10000 UNIT/GM OP OINT
TOPICAL_OINTMENT | OPHTHALMIC | Status: DC | PRN
  Administered 2018-10-07: 1 via OPHTHALMIC

## 2018-10-07 MED ORDER — SODIUM HYALURONATE 10 MG/ML IO SOLN
INTRAOCULAR | Status: AC
  Filled 2018-10-07: qty 0.85

## 2018-10-07 MED ORDER — ROCURONIUM BROMIDE 10 MG/ML (PF) SYRINGE
PREFILLED_SYRINGE | INTRAVENOUS | Status: DC | PRN
  Administered 2018-10-07: 50 mg via INTRAVENOUS
  Administered 2018-10-07: 20 mg via INTRAVENOUS

## 2018-10-07 MED ORDER — BUPIVACAINE HCL (PF) 0.75 % IJ SOLN
INTRAMUSCULAR | Status: AC
  Filled 2018-10-07: qty 10

## 2018-10-07 MED ORDER — ROSUVASTATIN CALCIUM 20 MG PO TABS
20.0000 mg | ORAL_TABLET | ORAL | Status: DC
  Administered 2018-10-07: 20 mg via ORAL
  Filled 2018-10-07: qty 1

## 2018-10-07 MED ORDER — POLYMYXIN B SULFATE 500000 UNITS IJ SOLR
INTRAMUSCULAR | Status: AC
  Filled 2018-10-07: qty 500000

## 2018-10-07 MED ORDER — LIDOCAINE 2% (20 MG/ML) 5 ML SYRINGE
INTRAMUSCULAR | Status: AC
  Filled 2018-10-07: qty 5

## 2018-10-07 MED ORDER — SUGAMMADEX SODIUM 200 MG/2ML IV SOLN
INTRAVENOUS | Status: DC | PRN
  Administered 2018-10-07: 200 mg via INTRAVENOUS

## 2018-10-07 MED ORDER — FENTANYL CITRATE (PF) 250 MCG/5ML IJ SOLN
INTRAMUSCULAR | Status: AC
  Filled 2018-10-07: qty 5

## 2018-10-07 MED ORDER — FENTANYL CITRATE (PF) 100 MCG/2ML IJ SOLN
25.0000 ug | INTRAMUSCULAR | Status: DC | PRN
  Administered 2018-10-07: 50 ug via INTRAVENOUS

## 2018-10-07 MED ORDER — DORZOLAMIDE HCL 2 % OP SOLN
1.0000 [drp] | Freq: Three times a day (TID) | OPHTHALMIC | Status: DC
  Filled 2018-10-07: qty 10

## 2018-10-07 MED ORDER — LIDOCAINE 2% (20 MG/ML) 5 ML SYRINGE
INTRAMUSCULAR | Status: DC | PRN
  Administered 2018-10-07: 80 mg via INTRAVENOUS

## 2018-10-07 MED ORDER — FENTANYL CITRATE (PF) 100 MCG/2ML IJ SOLN
INTRAMUSCULAR | Status: AC
  Filled 2018-10-07: qty 2

## 2018-10-07 MED ORDER — HYDROCHLOROTHIAZIDE 25 MG PO TABS
25.0000 mg | ORAL_TABLET | Freq: Every day | ORAL | Status: DC
  Administered 2018-10-07: 25 mg via ORAL
  Filled 2018-10-07: qty 1

## 2018-10-07 MED ORDER — CEFTAZIDIME 1 G IJ SOLR
INTRAMUSCULAR | Status: AC
  Filled 2018-10-07: qty 1

## 2018-10-07 MED ORDER — SODIUM CHLORIDE 0.9 % IV SOLN
INTRAVENOUS | Status: DC
  Administered 2018-10-07: 10:00:00 via INTRAVENOUS

## 2018-10-07 MED ORDER — TRIAMCINOLONE ACETONIDE 40 MG/ML IJ SUSP
INTRAMUSCULAR | Status: AC
  Filled 2018-10-07: qty 5

## 2018-10-07 MED ORDER — TROPICAMIDE 1 % OP SOLN
1.0000 [drp] | OPHTHALMIC | Status: AC | PRN
  Administered 2018-10-07 (×3): 1 [drp] via OPHTHALMIC
  Filled 2018-10-07: qty 15

## 2018-10-07 SURGICAL SUPPLY — 67 items
BALL CTTN LRG ABS STRL LF (GAUZE/BANDAGES/DRESSINGS) ×3
BLADE EYE CATARACT 19 1.4 BEAV (BLADE) IMPLANT
BLADE MVR KNIFE 19G (BLADE) IMPLANT
BLADE MVR KNIFE 20G (BLADE) ×2 IMPLANT
CANNULA VLV SOFT TIP 25G (OPHTHALMIC) ×1 IMPLANT
CANNULA VLV SOFT TIP 25GA (OPHTHALMIC) ×4 IMPLANT
CORD BIPOLAR FORCEPS 12FT (ELECTRODE) ×2 IMPLANT
COTTONBALL LRG STERILE PKG (GAUZE/BANDAGES/DRESSINGS) ×6 IMPLANT
COVER MAYO STAND STRL (DRAPES) IMPLANT
COVER WAND RF STERILE (DRAPES) ×2 IMPLANT
DRAPE INCISE 51X51 W/FILM STRL (DRAPES) IMPLANT
DRAPE OPHTHALMIC 77X100 STRL (CUSTOM PROCEDURE TRAY) ×2 IMPLANT
ERASER HMR WETFIELD 23G BP (MISCELLANEOUS) ×2 IMPLANT
FILTER BLUE MILLIPORE (MISCELLANEOUS) IMPLANT
FILTER STRAW FLUID ASPIR (MISCELLANEOUS) ×2 IMPLANT
FORCEPS GRIESHABER ILM 25G A (INSTRUMENTS) IMPLANT
GAS AUTO FILL CONSTEL (OPHTHALMIC) ×2
GAS AUTO FILL CONSTELLATION (OPHTHALMIC) ×1 IMPLANT
GLOVE SS BIOGEL STRL SZ 6.5 (GLOVE) ×1 IMPLANT
GLOVE SS BIOGEL STRL SZ 7 (GLOVE) ×1 IMPLANT
GLOVE SUPERSENSE BIOGEL SZ 6.5 (GLOVE)
GLOVE SUPERSENSE BIOGEL SZ 7 (GLOVE)
GLOVE SURG 8.5 LATEX PF (GLOVE) ×1 IMPLANT
GOWN STRL REUS W/ TWL LRG LVL3 (GOWN DISPOSABLE) ×3 IMPLANT
GOWN STRL REUS W/TWL LRG LVL3 (GOWN DISPOSABLE) ×6
HANDLE PNEUMATIC FOR CONSTEL (OPHTHALMIC) IMPLANT
KIT BASIN OR (CUSTOM PROCEDURE TRAY) ×2 IMPLANT
KNIFE GRIESHABER SHARP 2.5MM (MISCELLANEOUS) IMPLANT
MICROPICK 25G (MISCELLANEOUS)
NDL 18GX1X1/2 (RX/OR ONLY) (NEEDLE) ×1 IMPLANT
NDL 25GX 5/8IN NON SAFETY (NEEDLE) ×1 IMPLANT
NDL FILTER BLUNT 18X1 1/2 (NEEDLE) IMPLANT
NDL HYPO 30X.5 LL (NEEDLE) IMPLANT
NEEDLE 18GX1X1/2 (RX/OR ONLY) (NEEDLE) ×2 IMPLANT
NEEDLE 25GX 5/8IN NON SAFETY (NEEDLE) ×2 IMPLANT
NEEDLE FILTER BLUNT 18X 1/2SAF (NEEDLE)
NEEDLE FILTER BLUNT 18X1 1/2 (NEEDLE) IMPLANT
NEEDLE HYPO 30X.5 LL (NEEDLE) IMPLANT
NS IRRIG 1000ML POUR BTL (IV SOLUTION) ×2 IMPLANT
PACK FRAGMATOME (OPHTHALMIC) IMPLANT
PACK VITRECTOMY CUSTOM (CUSTOM PROCEDURE TRAY) ×2 IMPLANT
PAD ARMBOARD 7.5X6 YLW CONV (MISCELLANEOUS) ×4 IMPLANT
PAK PIK VITRECTOMY CVS 25GA (OPHTHALMIC) ×2 IMPLANT
PIC ILLUMINATED 25G (OPHTHALMIC) ×2
PICK MICROPICK 25G (MISCELLANEOUS) IMPLANT
PIK ILLUMINATED 25G (OPHTHALMIC) ×1 IMPLANT
PROBE LASER ILLUM FLEX CVD 25G (OPHTHALMIC) IMPLANT
REPL STRA BRUSH NDL (NEEDLE) ×1 IMPLANT
REPL STRA BRUSH NEEDLE (NEEDLE) ×2 IMPLANT
RESERVOIR BACK FLUSH (MISCELLANEOUS) ×2 IMPLANT
ROLLS DENTAL (MISCELLANEOUS) ×4 IMPLANT
SCRAPER DIAMOND 25GA (OPHTHALMIC RELATED) IMPLANT
SCRAPER DIAMOND DUST MEMBRANE (MISCELLANEOUS) ×2 IMPLANT
SPONGE SURGIFOAM ABS GEL 12-7 (HEMOSTASIS) ×2 IMPLANT
STOPCOCK 4 WAY LG BORE MALE ST (IV SETS) IMPLANT
SUT CHROMIC 7 0 TG140 8 (SUTURE) IMPLANT
SUT ETHILON 9 0 TG140 8 (SUTURE) ×3 IMPLANT
SUT POLY NON ABSORB 10-0 8 STR (SUTURE) IMPLANT
SUT SILK 4 0 RB 1 (SUTURE) IMPLANT
SYR 10ML LL (SYRINGE) IMPLANT
SYR 20ML LL LF (SYRINGE) ×2 IMPLANT
SYR 5ML LL (SYRINGE) IMPLANT
SYR BULB 3OZ (MISCELLANEOUS) ×2 IMPLANT
SYR TB 1ML LUER SLIP (SYRINGE) ×2 IMPLANT
TUBING HIGH PRESS EXTEN 6IN (TUBING) IMPLANT
WATER STERILE IRR 1000ML POUR (IV SOLUTION) ×2 IMPLANT
WIPE INSTRUMENT VISIWIPE 73X73 (MISCELLANEOUS) IMPLANT

## 2018-10-07 NOTE — Brief Op Note (Signed)
Brief Operative note   Preoperative diagnosis:  macular hole left eye Postoperative diagnosis  * No Diagnosis Codes entered *  Procedures: Pars plana vitrectomy, membrane peel, serum patch, laser, gas injection left eye.  Surgeon:  [name], MD...  Assistant:  Deatra Ina SA   Anesthesia: General  Specimen: none  Estimated blood loss:  1cc  Complications: none  Patient sent to PACU in good condition  Composed by [name] MD  Dictation number: [phone]

## 2018-10-07 NOTE — Anesthesia Procedure Notes (Signed)
Procedure Name: Intubation Date/Time: 10/07/2018 11:41 AM Performed by: Colin Benton, CRNA Pre-anesthesia Checklist: Patient identified, Emergency Drugs available, Suction available and Patient being monitored Patient Re-evaluated:Patient Re-evaluated prior to induction Oxygen Delivery Method: Circle system utilized Preoxygenation: Pre-oxygenation with 100% oxygen Induction Type: IV induction Ventilation: Mask ventilation without difficulty Laryngoscope Size: Miller and 2 Tube type: Oral Tube size: 7.0 mm Number of attempts: 1 Airway Equipment and Method: Stylet Placement Confirmation: ETT inserted through vocal cords under direct vision,  positive ETCO2 and breath sounds checked- equal and bilateral Secured at: 22 cm Tube secured with: Tape Dental Injury: Teeth and Oropharynx as per pre-operative assessment

## 2018-10-07 NOTE — Op Note (Signed)
NAME: [name], [name] [facility] MEDICAL RECORD [id number] ACCOUNT [id number] DATE OF BIRTH:1933-08-18 FACILITY: [facility] LOCATION: [place], [place]  OPERATIVE REPORT  DATE OF PROCEDURE:  10/07/2018  ADMISSION DIAGNOSIS:  Macular hole, left eye.  PROCEDURES:  Repair of macular hole with pars plana vitrectomy, retinal photocoagulation, gas fluid exchange, internal limiting membrane removal, serum patch membrane peel, all in the left eye.  SURGEON:  Tempie Hoist, MD  ASSISTANT:  Deatra Ina, SA  ANESTHESIA:  General.  DESCRIPTION OF PROCEDURE:  After proper endotracheal anesthesia, the usual prep was performed.  The indirect ophthalmoscope laser was moved into place, and 606 burns were placed around the retinal periphery.  The power 500 mW, 1000 microns each, and 0.1  seconds each.  Attention was carried to the pars plana area.  The conjunctiva was incised at 2:00.  Tenon's was incised.  The sclera became visible, and diathermy was used for hemostasis.  The diamond knife was used for 3/4 entry through the sclera.   Then, the MVR blade was used for entry into the vitreous cavity.  25-gauge trocars were placed at 4 o'clock and 10 o'clock.  Contact lens ring was anchored into place at 6 o'clock and 12 o'clock.  Provisc placed on the corneal surface, and the flat  contact lens was placed.  Pars plana vitrectomy was begun just behind the pseudophakos.  Vitreous and vitreous membranes were encountered.  These were carefully removed under low suction and rapid cutting.  The vitrectomy was carried down in a core  fashion toward the macular region, and the macular hole was seen.  There was a shiny surface of membrane extending around the macular hole.  The diamond-dusted membrane scraper was used to engage this membrane and peel it towards the hole for 360  degrees.  The internal limiting membrane was peeled.  The 30 degree prismatic lens was used for peripheral viewing.  The  pars plana vitrectomy was carried into the midvitreous area and the vitreous was removed.  Then, it was carried down to the vitreous  base for 360 degrees.  The magnifying contact lens was again positioned onto a layer of methylcellulose and onto the cornea.  Additional use of the diamond-dusted membrane scraper was performed so that the edges of the macular hole were mobile.  Remnants  of the internal limiting membrane were removed with the vitreous cutter.  A total gas fluid exchange was carried out.  Sufficient time was allowed for additional fluid to track down the walls of the eye and collect in the posterior segment.  The serum  patch was delivered ____.  The excess serum was removed with the [place] ophthalmics brush.  C3F8 14% was prepared and exchanged for intravitreal gas.  25-gauge trocars were removed, and the wounds were tested.  They were found to be secure.  The  sclerotomy point at 2:00 was closed with 9-0 nylon.  Polymyxin and ceftazidime were rinsed around the globe for antibiotic coverage.  Decadron 10 mg was injected into the lower subconjunctival space.  Marcaine solution was injected around the globe for  postop pain.  Polysporin ophthalmic ointment, a patch, and a shield were placed.  Closing pressure was 10 with a [place] tonometer.  COMPLICATIONS:  None.  DURATION:  One hour 15 minutes.  DISPOSITION:  The patient was awakened and taken to recovery in satisfactory condition.  LN/NUANCE  D:10/07/2018 T:10/07/2018 JOB:[id number]

## 2018-10-07 NOTE — H&P (Signed)
I examined the patient today and there is no change in the medical status 

## 2018-10-07 NOTE — Anesthesia Postprocedure Evaluation (Signed)
Anesthesia Post Note  Patient: [place] Daughtridge Digiacomo  Procedure(s) Performed: 25 GAUGE PARS PLANA VITRECTOMY WITH 20 GAUGE MVR PORT (Left Eye) INTERNAL LIMITING MEMBRANE PEEL (Left Eye) LEFT EYE SERUM PATCH FOR MACULAR HOLE (Left Eye) GAS INJECTION LEFT EYE (Left Eye) PHOTOCOAGULATION WITH LASER (Left Eye)     Patient location during evaluation: PACU Anesthesia Type: General Level of consciousness: awake Pain management: pain level controlled Vital Signs Assessment: post-procedure vital signs reviewed and stable Respiratory status: spontaneous breathing Postop Assessment: no apparent nausea or vomiting Anesthetic complications: no    Last Vitals:  Vitals:   10/07/18 0920 10/07/18 1319  BP: 130/76 (!) 143/58  Pulse: 61 65  Resp: 18 (!) 21  Temp: 36.6 C 36.4 C  SpO2: 94% 96%    Last Pain:  Vitals:   10/07/18 1332  TempSrc:   PainSc: 4                  Marysol Wellnitz

## 2018-10-07 NOTE — Anesthesia Preprocedure Evaluation (Addendum)
Anesthesia Evaluation  Patient identified by MRN, date of birth, ID band Patient awake    Reviewed: Allergy & Precautions, NPO status , Patient's Chart, lab work & pertinent test results  Airway Mallampati: II  TM Distance: >3 FB     Dental   Pulmonary    breath sounds clear to auscultation       Cardiovascular hypertension, + CAD and + Peripheral Vascular Disease   Rhythm:Regular Rate:Normal     Neuro/Psych    GI/Hepatic negative GI ROS, Neg liver ROS,   Endo/Other  negative endocrine ROS  Renal/GU negative Renal ROS     Musculoskeletal  (+) Arthritis ,   Abdominal   Peds  Hematology   Anesthesia Other Findings   Reproductive/Obstetrics                             Anesthesia Physical Anesthesia Plan  ASA: III  Anesthesia Plan: General   Post-op Pain Management:    Induction: Intravenous  PONV Risk Score and Plan: 3 and Ondansetron, Dexamethasone and Midazolam  Airway Management Planned: Oral ETT  Additional Equipment:   Intra-op Plan:   Post-operative Plan: Extubation in OR  Informed Consent: I have reviewed the patients History and Physical, chart, labs and discussed the procedure including the risks, benefits and alternatives for the proposed anesthesia with the patient or authorized representative who has indicated his/her understanding and acceptance.     Dental advisory given  Plan Discussed with: CRNA and Anesthesiologist  Anesthesia Plan Comments:         Anesthesia Quick Evaluation

## 2018-10-07 NOTE — Transfer of Care (Signed)
Immediate Anesthesia Transfer of Care Note  Patient: [name]  Procedure(s) Performed: 25 GAUGE PARS PLANA VITRECTOMY WITH 20 GAUGE MVR PORT (Left Eye) INTERNAL LIMITING MEMBRANE PEEL (Left Eye) LEFT EYE SERUM PATCH FOR MACULAR HOLE (Left Eye) GAS INJECTION LEFT EYE (Left Eye) PHOTOCOAGULATION WITH LASER (Left Eye)  Patient Location: PACU  Anesthesia Type:General  Level of Consciousness: drowsy  Airway & Oxygen Therapy: Patient Spontanous Breathing and Patient connected to nasal cannula oxygen  Post-op Assessment: Report given to RN and Post -op Vital signs reviewed and stable  Post vital signs: stable, reviewed  Last Vitals:  Vitals Value Taken Time  BP 143/58 10/07/18 1318  Temp    Pulse 65 10/07/18 1319  Resp 21 10/07/18 1319  SpO2 96 % 10/07/18 1319  Vitals shown include unvalidated device data.  Last Pain:  Vitals:   10/07/18 0935  TempSrc:   PainSc: 0-No pain      Patients Stated Pain Goal: 3 ([id number])  Complications: No apparent anesthesia complications

## 2018-10-08 ENCOUNTER — Encounter (HOSPITAL_COMMUNITY): Payer: Self-pay | Admitting: Ophthalmology

## 2018-10-08 DIAGNOSIS — H35342 Macular cyst, hole, or pseudohole, left eye: Secondary | ICD-10-CM | POA: Diagnosis not present

## 2018-10-08 DIAGNOSIS — E7800 Pure hypercholesterolemia, unspecified: Secondary | ICD-10-CM | POA: Diagnosis not present

## 2018-10-08 DIAGNOSIS — M1990 Unspecified osteoarthritis, unspecified site: Secondary | ICD-10-CM | POA: Diagnosis not present

## 2018-10-08 DIAGNOSIS — Z90710 Acquired absence of both cervix and uterus: Secondary | ICD-10-CM | POA: Diagnosis not present

## 2018-10-08 DIAGNOSIS — I2510 Atherosclerotic heart disease of native coronary artery without angina pectoris: Secondary | ICD-10-CM | POA: Diagnosis not present

## 2018-10-08 DIAGNOSIS — I739 Peripheral vascular disease, unspecified: Secondary | ICD-10-CM | POA: Diagnosis not present

## 2018-10-08 MED ORDER — PREDNISOLONE ACETATE 1 % OP SUSP: 1.0000 [drp] | Freq: Four times a day (QID) | OPHTHALMIC | 0 refills | Status: DC

## 2018-10-08 MED ORDER — GATIFLOXACIN 0.5 % OP SOLN: 1.0000 [drp] | Freq: Four times a day (QID) | OPHTHALMIC | Status: DC

## 2018-10-08 MED ORDER — BACITRACIN-POLYMYXIN B 500-10000 UNIT/GM OP OINT: 1.0000 "application " | TOPICAL_OINTMENT | Freq: Three times a day (TID) | OPHTHALMIC | 0 refills | Status: DC

## 2018-10-08 NOTE — Progress Notes (Signed)
[name] [place] to be D/C'd  per MD order. Discussed with the patient and all questions fully answered.  VSS, Skin clean, dry and intact without evidence of skin break down, no evidence of skin tears noted.  IV catheter discontinued intact. Site without signs and symptoms of complications. Dressing and pressure applied.  An After Visit Summary was printed and given to the patient. Patient received prescription.  D/c education completed with patient/family including follow up instructions, medication list, d/c activities limitations if indicated, with other d/c instructions as indicated by MD - patient able to verbalize understanding, all questions fully answered.   Patient instructed to return to ED, call 911, or call MD for any changes in condition.   Patient to be escorted via [place], and D/C home via private auto.

## 2018-10-08 NOTE — Discharge Summary (Signed)
Discharge summary not needed on [facility] patients per medical records. 

## 2018-10-08 NOTE — Progress Notes (Signed)
10/08/2018, 6:37 AM  Mental Status:  Awake, Alert, Oriented.  Patient not positioning as ordered.  Sitting in a chair leaning back with legs crossed.  I stressed the importance of face down positioning to assure success of the surgery.  I needed to tell her to look downward 7 times in 10 min visit.    Anterior segment: Cornea  Clear    Anterior Chamber Clear    Lens:   IOL  Intra Ocular Pressure 19  mmHg with Tonopen  Vitreous: Clear 90%gas bubble Hemorrhage  Silicone oil  Retina:  Attached Good laser reactio  Impression: Excellent result Retina attached  Final Diagnosis: Principal Problem:   Macular hole, left eye   Plan: start post operative eye drops.  Discharge to home.  Give post operative instructions  [name] 10/08/2018, 6:37 AM

## 2018-10-14 ENCOUNTER — Encounter (INDEPENDENT_AMBULATORY_CARE_PROVIDER_SITE_OTHER): Payer: Medicare Other | Admitting: Ophthalmology

## 2018-10-14 ENCOUNTER — Other Ambulatory Visit: Payer: Self-pay

## 2018-10-14 DIAGNOSIS — H35342 Macular cyst, hole, or pseudohole, left eye: Secondary | ICD-10-CM

## 2018-11-05 ENCOUNTER — Other Ambulatory Visit: Payer: Self-pay

## 2018-11-05 ENCOUNTER — Encounter (INDEPENDENT_AMBULATORY_CARE_PROVIDER_SITE_OTHER): Payer: Medicare Other | Admitting: Ophthalmology

## 2018-11-05 DIAGNOSIS — H35342 Macular cyst, hole, or pseudohole, left eye: Secondary | ICD-10-CM

## 2018-11-11 ENCOUNTER — Ambulatory Visit: Payer: Medicare Other | Admitting: Cardiology

## 2018-11-11 NOTE — Progress Notes (Deleted)
Primary Physician:  [name], MD   Patient ID: [name], female    DOB: Sep 19, 1933, 85 y.o.   MRN: [id number]  Subjective:    No chief complaint on file.   HPI: [name]  is a 85 y.o. female  with history of known coronary artery disease and has had CABG in 2007, has asymptomatic carotid artery stenosis, underwent right carotid endarterectomy by Dr. Ruta Hinds in July 2014, was found to have restenosis and she underwent right carotid artery stenting on 04/11/2013.  Patient was admitted to the hospital for acute hypoxemia respiratory failure and bronchitis on 03/21/2018. CTA of the chest was negative for PE, but was noted to have numerous scattered bilateral groundglass and small nodular opacities, diffuse mosaic attenuation of airspaces and mild bilateral bronchial wall thickening. Echo was also performed that revealed normal LVEF and severe basal septal hypertrophy.   At her last office visit, she was started on HCTZ for better BP control. She is now here for 6 week follow up. She continues to have dyspnea on exertion, but feels that this is stable. She is overall feeling well. No chest pain. No complaints today. She is have left eye procedure on Sept 1 for vision changes related to macular changes.   She does report a few falls over the last few months. States after one fall, she had some numbness in her right ankle area that resolved after about 2 months. She has noticed sometimes some right leg weakness, but no symptoms in her hands.   Past Medical History:  Diagnosis Date  . Arthritis   . Chronic cough   . Coronary artery disease   . Diverticulitis   . Hypercholesteremia   . Hypertension   . Internal carotid artery stenosis 08/2012  . Normal cardiac stress test 08/2012   @ Dr Irven Shelling office  . Peripheral vascular disease [facility])     Past Surgical History:  Procedure Laterality Date  . [place] VITRECTOMY WITH 20 GAUGE MVR  PORT FOR MACULAR HOLE Left 10/07/2018   Procedure: 25 GAUGE PARS PLANA VITRECTOMY WITH 20 GAUGE MVR PORT;  Surgeon: [name], MD;  Location: [place];  Service: Ophthalmology;  Laterality: Left;  . ABDOMINAL HYSTERECTOMY    . APPENDECTOMY    . CAROTID ANGIOGRAM N/A 04/01/2013   Procedure: CAROTID ANGIOGRAM;  Surgeon: Elam Dutch, MD;  Location: [facility] CATH LAB;  Service: Cardiovascular;  Laterality: N/A;  . CAROTID ENDARTERECTOMY Right August 25, 2012   cea  . CAROTID STENT INSERTION Right 04/22/2013   Procedure: CAROTID STENT INSERTION;  Surgeon: Elam Dutch, MD;  Location: [facility] CATH LAB;  Service: Cardiovascular;  Laterality: Right;  . CORONARY ARTERY BYPASS GRAFT    . ENDARTERECTOMY Right 08/25/2012   Procedure: ENDARTERECTOMY CAROTID;  Surgeon: Elam Dutch, MD;  Location: [place];  Service: Vascular;  Laterality: Right;  . EYE SURGERY     both cataracts  . FRACTURE SURGERY Left Jun 14, 2011   [place]  . GAS INSERTION Left 10/07/2018   Procedure: GAS INJECTION LEFT EYE;  Surgeon: [name], MD;  Location: [place];  Service: Ophthalmology;  Laterality: Left;  . MEMBRANE PEEL Left 10/07/2018   Procedure: INTERNAL LIMITING MEMBRANE PEEL;  Surgeon: [name], MD;  Location: [place];  Service: Ophthalmology;  Laterality: Left;  . ORIF ANKLE FRACTURE  06/14/2011   Procedure: OPEN REDUCTION INTERNAL FIXATION (ORIF) ANKLE FRACTURE;  Surgeon: Ninetta Lights, MD;  Location: [place];  Service: Orthopedics;  Laterality: Left;  left ankle fracture open treatment trimalleolar ankle includes internal fixation WITHOUT fixation of posterior lip  . PHOTOCOAGULATION WITH LASER Left 10/07/2018   Procedure: PHOTOCOAGULATION WITH LASER;  Surgeon: [name], MD;  Location: [place];  Service: Ophthalmology;  Laterality: Left;  . SERUM PATCH Left 10/07/2018   Procedure: LEFT EYE SERUM PATCH FOR MACULAR HOLE;  Surgeon: [name], MD;  Location: [place];  Service: Ophthalmology;   Laterality: Left;  . SHOULDER ARTHROSCOPY WITH SUBACROMIAL DECOMPRESSION, ROTATOR CUFF REPAIR AND BICEP TENDON REPAIR Left 06/05/2012   Procedure: LEFT SHOULDER ARTHROSCOPY WITH SUBACROMIAL DECOMPRESSION, PARTIAL ACROMIOPLASTY WITH CORACOACROMIAL RELEASE, DISTAL CLAVICULECTOMY, WITH ROTATOR CUFF REPAIR AND BICEP TENODESIS;  Surgeon: Ninetta Lights, MD;  Location: [place];  Service: Orthopedics;  Laterality: Left;    Social History   Socioeconomic History  . Marital status: Widowed    Spouse name: Not on file  . Number of children: 1  . Years of education: Not on file  . Highest education level: Not on file  Occupational History  . Not on file  Social Needs  . Financial resource strain: Not on file  . Food insecurity    Worry: Not on file    Inability: Not on file  . Transportation needs    Medical: Not on file    Non-medical: Not on file  Tobacco Use  . Smoking status: Never Smoker  . Smokeless tobacco: Never Used  Substance and Sexual Activity  . Alcohol use: Yes    Alcohol/week: 7.0 standard drinks    Types: 7 Glasses of wine per week  . Drug use: No  . Sexual activity: Never  Lifestyle  . Physical activity    Days per week: Not on file    Minutes per session: Not on file  . Stress: Not on file  Relationships  . Social Herbalist on phone: Not on file    Gets together: Not on file    Attends religious service: Not on file    Active member of club or organization: Not on file    Attends meetings of clubs or organizations: Not on file    Relationship status: Not on file  . Intimate partner violence    Fear of current or ex partner: Not on file    Emotionally abused: Not on file    Physically abused: Not on file    Forced sexual activity: Not on file  Other Topics Concern  . Not on file  Social History Narrative  . Not on file    Review of Systems  Constitution: Negative for decreased appetite, malaise/fatigue, weight gain and weight  loss.  Eyes: Negative for visual disturbance.  Cardiovascular: Positive for dyspnea on exertion. Negative for chest pain, claudication, leg swelling, orthopnea, palpitations and syncope.  Respiratory: Positive for cough. Negative for hemoptysis and wheezing.   Endocrine: Negative for cold intolerance and heat intolerance.  Hematologic/Lymphatic: Does not bruise/bleed easily.  Skin: Negative for nail changes.  Musculoskeletal: Negative for muscle weakness and myalgias.  Gastrointestinal: Negative for abdominal pain, change in bowel habit, nausea and vomiting.  Neurological: Negative for difficulty with concentration, dizziness, focal weakness and headaches.  Psychiatric/Behavioral: Negative for altered mental status and suicidal ideas.  All other systems reviewed and are negative.     Objective:  There were no vitals taken for this visit. There is no height or weight on file to  calculate BMI.    Physical Exam  Constitutional: She is oriented to person, place, and time. Vital signs are normal. She appears well-developed and well-nourished.  HENT:  Head: Normocephalic and atraumatic.  Neck: Normal range of motion.  Cardiovascular: Normal rate, regular rhythm, normal heart sounds and intact distal pulses.  Pulses:      Carotid pulses are on the right side with bruit and on the left side with bruit.      Femoral pulses are 2+ on the right side and 2+ on the left side.      Popliteal pulses are 2+ on the right side and 2+ on the left side.       Dorsalis pedis pulses are 1+ on the right side and 1+ on the left side.       Posterior tibial pulses are 1+ on the right side and 1+ on the left side.  Physiological split S2  Pulmonary/Chest: Effort normal and breath sounds normal. No accessory muscle usage. No respiratory distress.  Abdominal: Soft. Bowel sounds are normal.  Musculoskeletal: Normal range of motion.  Neurological: She is alert and oriented to person, place, and time.  Skin: Skin  is warm and dry.  Vitals reviewed.  Radiology: No results found.  Laboratory examination:    CMP Latest Ref Rng & Units 10/07/2018 03/23/2018 03/22/2018  Glucose 70 - 99 mg/dL 103(H) 84 85  BUN 8 - 23 mg/dL 14 13 13   Creatinine 0.44 - 1.00 mg/dL 0.77 0.79 0.85  Sodium 135 - 145 mmol/L 137 136 139  Potassium 3.5 - 5.1 mmol/L 3.8 4.4 5.1  Chloride 98 - 111 mmol/L 98 100 104  CO2 22 - 32 mmol/L 27 33(H) 29  Calcium 8.9 - 10.3 mg/dL 9.2 8.6(L) 8.7(L)  Total Protein 6.5 - 8.1 g/dL - - -  Total Bilirubin 0.3 - 1.2 mg/dL - - -  Alkaline Phos 38 - 126 U/L - - -  AST 15 - 41 U/L - - -  ALT 0 - 44 U/L - - -   CBC Latest Ref Rng & Units 10/07/2018 03/23/2018 03/22/2018  WBC 4.0 - 10.5 K/uL 5.1 6.0 6.2  Hemoglobin 12.0 - 15.0 g/dL 13.6 11.9(L) 12.1  Hematocrit 36.0 - 46.0 % 41.1 37.4 38.2  Platelets 150 - 400 K/uL 222 276 298   Lipid Panel  No results found for: CHOL, TRIG, HDL, CHOLHDL, VLDL, LDLCALC, LDLDIRECT HEMOGLOBIN A1C No results found for: HGBA1C, MPG TSH No results for input(s): TSH in the last 8760 hours.  PRN Meds:. There are no discontinued medications. No outpatient medications have been marked as taking for the 11/11/18 encounter (Appointment) with Miquel Dunn, NP.    Cardiac Studies:   Echocardiogram 03/22/2018:  1. The left ventricle has normal systolic function, with an ejection fraction of 55-60%. The cavity size was normal. Severe Basal Septal hypertrophy. Left ventricular diastolic Doppler parameters are indeterminate due to nondiagnostic images. 2. The right ventricle has normal systolic function. The cavity was normal. There is no increase in right ventricular wall thickness. 3. The mitral valve is normal in structure. There is mild mitral annular calcification present. 4. The tricuspid valve is normal in structure. 5. The aortic valve is tricuspid Mild sclerosis of the aortic valve. 6. The pulmonic valve was normal in structure. 7. Right atrial  pressure is estimated at 3 mmHg. 8. The interatrial septum appears to be lipomatous. 9. When compared to the prior study: No prior Echo to compare.  CTA of chest  03/21/2018: 1. Negative examination for pulmonary embolism. 2. There are numerous scattered bilateral ground-glass and small nodular pulmonary opacities. Diffuse mosaic attenuation of the airspaces and mild bilateral bronchial wall thickening. Findings are nonspecific and infectious or inflammatory. Given the presence of nodularity, recommend follow-up CT examination at 3 months to evaluate for resolution. 3. Coronary artery disease.  Lexiscan Myoview stress test 08/18/2012:  1. Resting EKG NSR, Poor R wave progression. Stress EKG was non diagnostic for ischemia. No ST-T changes of ischemia noted with pharmacologic stress testing.  2. The perfusion study demonstrated normal isotope uptake both at rest and stress. There was no evidence of ischemia or scar. Dynamic gated images reveal normal wall motion and endocardial thickening. Left ventricular ejection fraction was estimated to be 88%.  3. Based on the study, patient can be taken up for surgery with acceptable (low) CV morbidity and mortality. Results are faxed to the Dr. Ruta Hinds who has schedule her for carotid endarterectomy.   Assessment:   No diagnosis found.  EKG 09/29/2018: Normal sinus rhythm at 60 bpm, left atrial enlargement, no evidence of ischemia.   Recommendations:   Patient continues to have dyspnea on exertion that is unchanged compared to 6 months ago.  She overall feels that she is doing well.  She has not had any stress test since 2014; however, as she overall feels that she is doing well, hold off on further cardiac work-up for now.  No chest pain.  She is on appropriate medical therapy.  She is noted be hypertensive, which could be contributing to her dyspnea.  I will add hydrochlorothiazide to her losartan.  Will check BMP in 10 days for  surveillance of kidney function.  No clinical evidence of heart failure.  She does continue to have bilateral carotid stenosis.  She is previously been followed by Dr. Oneida Alar, but has not seen him since last year.  She is unaware of upcoming appointment.  Does not appear by previous carotid duplex that she has any significant carotid stenosis, and could continue with as needed carotid duplex.  We will be seeing her regularly and can continue to follow this.  I will see her back in 6 weeks for follow-up on hypertension.   *I have discussed this case with Dr. Einar Gip and he personally examined the patient and participated in formulating the plan.*   Miquel Dunn, MSN, APRN, FNP-C [facility]. [place] Office: [phone] Fax: [phone]

## 2018-11-13 ENCOUNTER — Other Ambulatory Visit: Payer: Self-pay

## 2018-11-13 ENCOUNTER — Encounter: Payer: Self-pay | Admitting: Cardiology

## 2018-11-13 ENCOUNTER — Ambulatory Visit (INDEPENDENT_AMBULATORY_CARE_PROVIDER_SITE_OTHER): Payer: Medicare Other | Admitting: Cardiology

## 2018-11-13 VITALS — BP 120/66 | HR 61 | Temp 95.6°F | Ht 62.0 in | Wt 142.0 lb

## 2018-11-13 DIAGNOSIS — R0600 Dyspnea, unspecified: Secondary | ICD-10-CM

## 2018-11-13 DIAGNOSIS — I2510 Atherosclerotic heart disease of native coronary artery without angina pectoris: Secondary | ICD-10-CM | POA: Diagnosis not present

## 2018-11-13 DIAGNOSIS — I10 Essential (primary) hypertension: Secondary | ICD-10-CM | POA: Diagnosis not present

## 2018-11-13 DIAGNOSIS — R0609 Other forms of dyspnea: Secondary | ICD-10-CM

## 2018-11-13 NOTE — Progress Notes (Signed)
Primary Physician:  [name], MD   Patient ID: [name], female    DOB: 1933-08-13, 85 y.o.   MRN: [id number]  Subjective:    Chief Complaint  Patient presents with  . Hypertension  . Follow-up    lab results    HPI: [name]  is a 85 y.o. female  with history of known coronary artery disease and has had CABG in 2007, has asymptomatic carotid artery stenosis, underwent right carotid endarterectomy by Dr. Ruta Hinds in July 2014, was found to have restenosis and she underwent right carotid artery stenting on 04/11/2013.  Patient was admitted to the hospital for acute hypoxemia respiratory failure and bronchitis on 03/21/2018. CTA of the chest was negative for PE, but was noted to have numerous scattered bilateral groundglass and small nodular opacities, diffuse mosaic attenuation of airspaces and mild bilateral bronchial wall thickening. Echo was also performed that revealed normal LVEF and severe basal septal hypertrophy.   She is here for 6 week follow up, started on HCTZ at her last visit for hypertension. She continues to have dyspnea on exertion, but feels that this is stable. She is overall feeling well. No chest pain. No complaints today. She has undergone left eye procedure recently and is recovering from this.   Past Medical History:  Diagnosis Date  . Arthritis   . Chronic cough   . Coronary artery disease   . Diverticulitis   . Hypercholesteremia   . Hypertension   . Internal carotid artery stenosis 08/2012  . Normal cardiac stress test 08/2012   @ Dr Irven Shelling office  . Peripheral vascular disease [facility])     Past Surgical History:  Procedure Laterality Date  . Jet VITRECTOMY WITH 20 GAUGE MVR PORT FOR MACULAR HOLE Left 10/07/2018   Procedure: 25 GAUGE PARS PLANA VITRECTOMY WITH 20 GAUGE MVR PORT;  Surgeon: [name], MD;  Location: [place];  Service: Ophthalmology;  Laterality: Left;  . ABDOMINAL  HYSTERECTOMY    . APPENDECTOMY    . CAROTID ANGIOGRAM N/A 04/01/2013   Procedure: CAROTID ANGIOGRAM;  Surgeon: Elam Dutch, MD;  Location: [facility] CATH LAB;  Service: Cardiovascular;  Laterality: N/A;  . CAROTID ENDARTERECTOMY Right August 25, 2012   cea  . CAROTID STENT INSERTION Right 04/22/2013   Procedure: CAROTID STENT INSERTION;  Surgeon: Elam Dutch, MD;  Location: [facility] CATH LAB;  Service: Cardiovascular;  Laterality: Right;  . CORONARY ARTERY BYPASS GRAFT    . ENDARTERECTOMY Right 08/25/2012   Procedure: ENDARTERECTOMY CAROTID;  Surgeon: Elam Dutch, MD;  Location: [place];  Service: Vascular;  Laterality: Right;  . EYE SURGERY     both cataracts  . FRACTURE SURGERY Left Jun 14, 2011   Ankle  . GAS INSERTION Left 10/07/2018   Procedure: GAS INJECTION LEFT EYE;  Surgeon: [name], MD;  Location: [place];  Service: Ophthalmology;  Laterality: Left;  . MEMBRANE PEEL Left 10/07/2018   Procedure: INTERNAL LIMITING MEMBRANE PEEL;  Surgeon: [name], MD;  Location: [place];  Service: Ophthalmology;  Laterality: Left;  . ORIF ANKLE FRACTURE  06/14/2011   Procedure: OPEN REDUCTION INTERNAL FIXATION (ORIF) ANKLE FRACTURE;  Surgeon: Ninetta Lights, MD;  Location: [place];  Service: Orthopedics;  Laterality: Left;  left ankle fracture open treatment trimalleolar ankle includes internal fixation WITHOUT fixation of posterior lip  . PHOTOCOAGULATION WITH LASER Left 10/07/2018   Procedure: PHOTOCOAGULATION WITH LASER;  Surgeon: Zigmund Daniel,  Chrystie Nose, MD;  Location: [place];  Service: Ophthalmology;  Laterality: Left;  . SERUM PATCH Left 10/07/2018   Procedure: LEFT EYE SERUM PATCH FOR MACULAR HOLE;  Surgeon: [name], MD;  Location: [place];  Service: Ophthalmology;  Laterality: Left;  . SHOULDER ARTHROSCOPY WITH SUBACROMIAL DECOMPRESSION, ROTATOR CUFF REPAIR AND BICEP TENDON REPAIR Left 06/05/2012   Procedure: LEFT SHOULDER ARTHROSCOPY WITH SUBACROMIAL DECOMPRESSION, PARTIAL  ACROMIOPLASTY WITH CORACOACROMIAL RELEASE, DISTAL CLAVICULECTOMY, WITH ROTATOR CUFF REPAIR AND BICEP TENODESIS;  Surgeon: Ninetta Lights, MD;  Location: [place];  Service: Orthopedics;  Laterality: Left;    Social History   Socioeconomic History  . Marital status: Widowed    Spouse name: Not on file  . Number of children: 1  . Years of education: Not on file  . Highest education level: Not on file  Occupational History  . Not on file  Social Needs  . Financial resource strain: Not on file  . Food insecurity    Worry: Not on file    Inability: Not on file  . Transportation needs    Medical: Not on file    Non-medical: Not on file  Tobacco Use  . Smoking status: Never Smoker  . Smokeless tobacco: Never Used  Substance and Sexual Activity  . Alcohol use: Yes    Alcohol/week: 7.0 standard drinks    Types: 7 Glasses of wine per week  . Drug use: No  . Sexual activity: Never  Lifestyle  . Physical activity    Days per week: Not on file    Minutes per session: Not on file  . Stress: Not on file  Relationships  . Social Herbalist on phone: Not on file    Gets together: Not on file    Attends religious service: Not on file    Active member of club or organization: Not on file    Attends meetings of clubs or organizations: Not on file    Relationship status: Not on file  . Intimate partner violence    Fear of current or ex partner: Not on file    Emotionally abused: Not on file    Physically abused: Not on file    Forced sexual activity: Not on file  Other Topics Concern  . Not on file  Social History Narrative  . Not on file    Review of Systems  Constitution: Negative for decreased appetite, malaise/fatigue, weight gain and weight loss.  Eyes: Negative for visual disturbance.  Cardiovascular: Negative for chest pain, claudication, dyspnea on exertion, leg swelling, orthopnea, palpitations and syncope.  Respiratory: Positive for cough.  Negative for hemoptysis and wheezing.   Endocrine: Negative for cold intolerance and heat intolerance.  Hematologic/Lymphatic: Does not bruise/bleed easily.  Skin: Negative for nail changes.  Musculoskeletal: Negative for muscle weakness and myalgias.  Gastrointestinal: Negative for abdominal pain, change in bowel habit, nausea and vomiting.  Neurological: Negative for difficulty with concentration, dizziness, focal weakness and headaches.  Psychiatric/Behavioral: Negative for altered mental status and suicidal ideas.  All other systems reviewed and are negative.     Objective:  Blood pressure 120/66, pulse 61, temperature (!) 95.6 F (35.3 C), height 5\' 2"  (1.575 m), weight 142 lb (64.4 kg), SpO2 93 %. Body mass index is 25.97 kg/m.    Physical Exam  Constitutional: She is oriented to person, place, and time. Vital signs are normal. She appears well-developed and well-nourished.  HENT:  Head: Normocephalic and atraumatic.  Neck: Normal range of motion.  Cardiovascular: Normal rate, regular rhythm, normal heart sounds and intact distal pulses.  Pulses:      Carotid pulses are on the right side with bruit and on the left side with bruit.      Femoral pulses are 2+ on the right side and 2+ on the left side.      Popliteal pulses are 2+ on the right side and 2+ on the left side.       Dorsalis pedis pulses are 1+ on the right side and 1+ on the left side.       Posterior tibial pulses are 1+ on the right side and 1+ on the left side.  Physiological split S2  Pulmonary/Chest: Effort normal and breath sounds normal. No accessory muscle usage. No respiratory distress.  Abdominal: Soft. Bowel sounds are normal.  Musculoskeletal: Normal range of motion.  Neurological: She is alert and oriented to person, place, and time.  Skin: Skin is warm and dry.  Vitals reviewed.  Radiology: No results found.  Laboratory examination:    CMP Latest Ref Rng & Units 10/07/2018 03/23/2018 03/22/2018   Glucose 70 - 99 mg/dL 103(H) 84 85  BUN 8 - 23 mg/dL 14 13 13   Creatinine 0.44 - 1.00 mg/dL 0.77 0.79 0.85  Sodium 135 - 145 mmol/L 137 136 139  Potassium 3.5 - 5.1 mmol/L 3.8 4.4 5.1  Chloride 98 - 111 mmol/L 98 100 104  CO2 22 - 32 mmol/L 27 33(H) 29  Calcium 8.9 - 10.3 mg/dL 9.2 8.6(L) 8.7(L)  Total Protein 6.5 - 8.1 g/dL - - -  Total Bilirubin 0.3 - 1.2 mg/dL - - -  Alkaline Phos 38 - 126 U/L - - -  AST 15 - 41 U/L - - -  ALT 0 - 44 U/L - - -   CBC Latest Ref Rng & Units 10/07/2018 03/23/2018 03/22/2018  WBC 4.0 - 10.5 K/uL 5.1 6.0 6.2  Hemoglobin 12.0 - 15.0 g/dL 13.6 11.9(L) 12.1  Hematocrit 36.0 - 46.0 % 41.1 37.4 38.2  Platelets 150 - 400 K/uL 222 276 298   Lipid Panel  No results found for: CHOL, TRIG, HDL, CHOLHDL, VLDL, LDLCALC, LDLDIRECT HEMOGLOBIN A1C No results found for: HGBA1C, MPG TSH No results for input(s): TSH in the last 8760 hours.  PRN Meds:. Medications Discontinued During This Encounter  Medication Reason  . Calcium Carb-Cholecalciferol (CALCIUM 600 + D PO) Error  . citalopram (CELEXA) 20 MG tablet Error   Current Meds  Medication Sig  . acetaminophen (TYLENOL) 500 MG tablet Take 500 mg by mouth every 6 (six) hours as needed for moderate pain or headache.  Marland Kitchen aspirin 81 MG tablet Take 81 mg by mouth daily.  . bacitracin-polymyxin b (POLYSPORIN) ophthalmic ointment Place 1 application into the left eye 3 (three) times daily. apply to eye every 12 hours while awake  . Calcium Carbonate (CALCIUM 500 PO) Take 3 tablets by mouth every morning.  . Cholecalciferol (VITAMIN D) 50 MCG (2000 UT) tablet Take 2,000-4,000 Units by mouth See admin instructions. Take 4000 units in the morning and 2000 units at night  . denosumab (PROLIA) 60 MG/ML SOSY injection Inject 60 mg into the skin every 6 (six) months.   . ezetimibe (ZETIA) 10 MG tablet Take 10 mg by mouth every other day. Alternates zetia one day and crestor the next  . gatifloxacin (ZYMAXID) 0.5 % SOLN Place 1  drop into the left eye 4 (four) times daily.  Marland Kitchen  losartan-hydrochlorothiazide (HYZAAR) 100-12.5 MG tablet Take 1 tablet by mouth daily.  . metoprolol tartrate (LOPRESSOR) 25 MG tablet Take 1 tablet (25 mg total) by mouth 2 (two) times daily.  . Multiple Vitamins-Minerals (PRESERVISION AREDS 2) CAPS Take 1 capsule by mouth 2 (two) times daily.  . prednisoLONE acetate (PRED FORTE) 1 % ophthalmic suspension Place 1 drop into the left eye 4 (four) times daily.  . rosuvastatin (CRESTOR) 20 MG tablet Take 20 mg by mouth every other day. Alternates zetia one day and crestor the next  . [DISCONTINUED] Calcium Carb-Cholecalciferol (CALCIUM 600 + D PO) Take 1 tablet by mouth 2 (two) times daily.    Cardiac Studies:   Echocardiogram 03/22/2018:  1. The left ventricle has normal systolic function, with an ejection fraction of 55-60%. The cavity size was normal. Severe Basal Septal hypertrophy. Left ventricular diastolic Doppler parameters are indeterminate due to nondiagnostic images. 2. The right ventricle has normal systolic function. The cavity was normal. There is no increase in right ventricular wall thickness. 3. The mitral valve is normal in structure. There is mild mitral annular calcification present. 4. The tricuspid valve is normal in structure. 5. The aortic valve is tricuspid Mild sclerosis of the aortic valve. 6. The pulmonic valve was normal in structure. 7. Right atrial pressure is estimated at 3 mmHg. 8. The interatrial septum appears to be lipomatous. 9. When compared to the prior study: No prior Echo to compare.  CTA of chest 03/21/2018: 1. Negative examination for pulmonary embolism. 2. There are numerous scattered bilateral ground-glass and small nodular pulmonary opacities. Diffuse mosaic attenuation of the airspaces and mild bilateral bronchial wall thickening. Findings are nonspecific and infectious or inflammatory. Given the presence of nodularity, recommend  follow-up CT examination at 3 months to evaluate for resolution. 3. Coronary artery disease.  Lexiscan Myoview stress test 08/18/2012:  1. Resting EKG NSR, Poor R wave progression. Stress EKG was non diagnostic for ischemia. No ST-T changes of ischemia noted with pharmacologic stress testing.  2. The perfusion study demonstrated normal isotope uptake both at rest and stress. There was no evidence of ischemia or scar. Dynamic gated images reveal normal wall motion and endocardial thickening. Left ventricular ejection fraction was estimated to be 88%.  3. Based on the study, patient can be taken up for surgery with acceptable (low) CV morbidity and mortality. Results are faxed to the Dr. Ruta Hinds who has schedule her for carotid endarterectomy.   Assessment:   No diagnosis found.  EKG 09/29/2018: Normal sinus rhythm at 60 bpm, left atrial enlargement, no evidence of ischemia.   Recommendations:   Patient is here for 6-week office visit and follow-up for hypertension.  Her blood pressure has significantly improved with addition of hydrochlorothiazide and she is tolerating this well.  Labs have remained stable.  We will continue the same.  Dyspnea on exertion has improved and she denies today.  No symptoms of chest pain.  She is overall feeling well.  We will plan to see her back in 6 months, but encouraged her to contact us sooner if needed.   Miquel Dunn, MSN, APRN, FNP-C [facility]. [place] Office: [phone] Fax: [phone]

## 2018-11-21 ENCOUNTER — Other Ambulatory Visit: Payer: Self-pay | Admitting: Internal Medicine

## 2018-11-21 DIAGNOSIS — Z1231 Encounter for screening mammogram for malignant neoplasm of breast: Secondary | ICD-10-CM

## 2018-12-03 ENCOUNTER — Encounter (INDEPENDENT_AMBULATORY_CARE_PROVIDER_SITE_OTHER): Payer: Medicare Other | Admitting: Ophthalmology

## 2018-12-10 DIAGNOSIS — Z23 Encounter for immunization: Secondary | ICD-10-CM | POA: Diagnosis not present

## 2019-01-14 ENCOUNTER — Ambulatory Visit
Admission: RE | Admit: 2019-01-14 | Discharge: 2019-01-14 | Disposition: A | Discharge status: Home / Self Care | Payer: Medicare Other | Source: Ambulatory Visit | Attending: Internal Medicine | Admitting: Internal Medicine

## 2019-01-14 ENCOUNTER — Other Ambulatory Visit: Payer: Self-pay

## 2019-01-14 ENCOUNTER — Encounter (INDEPENDENT_AMBULATORY_CARE_PROVIDER_SITE_OTHER): Payer: Medicare Other | Admitting: Ophthalmology

## 2019-01-14 DIAGNOSIS — H43813 Vitreous degeneration, bilateral: Secondary | ICD-10-CM | POA: Diagnosis not present

## 2019-01-14 DIAGNOSIS — H35033 Hypertensive retinopathy, bilateral: Secondary | ICD-10-CM | POA: Diagnosis not present

## 2019-01-14 DIAGNOSIS — H35342 Macular cyst, hole, or pseudohole, left eye: Secondary | ICD-10-CM | POA: Diagnosis not present

## 2019-01-14 DIAGNOSIS — H353132 Nonexudative age-related macular degeneration, bilateral, intermediate dry stage: Secondary | ICD-10-CM | POA: Diagnosis not present

## 2019-01-14 DIAGNOSIS — Z1231 Encounter for screening mammogram for malignant neoplasm of breast: Secondary | ICD-10-CM | POA: Diagnosis not present

## 2019-01-14 DIAGNOSIS — I10 Essential (primary) hypertension: Secondary | ICD-10-CM | POA: Diagnosis not present

## 2019-02-04 ENCOUNTER — Other Ambulatory Visit: Payer: Self-pay | Admitting: Cardiology

## 2019-02-25 DIAGNOSIS — H11153 Pinguecula, bilateral: Secondary | ICD-10-CM | POA: Diagnosis not present

## 2019-02-25 DIAGNOSIS — H40013 Open angle with borderline findings, low risk, bilateral: Secondary | ICD-10-CM | POA: Diagnosis not present

## 2019-02-25 DIAGNOSIS — H35342 Macular cyst, hole, or pseudohole, left eye: Secondary | ICD-10-CM | POA: Diagnosis not present

## 2019-02-25 DIAGNOSIS — H43813 Vitreous degeneration, bilateral: Secondary | ICD-10-CM | POA: Diagnosis not present

## 2019-02-25 DIAGNOSIS — H5213 Myopia, bilateral: Secondary | ICD-10-CM | POA: Diagnosis not present

## 2019-02-25 DIAGNOSIS — H52223 Regular astigmatism, bilateral: Secondary | ICD-10-CM | POA: Diagnosis not present

## 2019-02-25 DIAGNOSIS — H18413 Arcus senilis, bilateral: Secondary | ICD-10-CM | POA: Diagnosis not present

## 2019-03-23 ENCOUNTER — Other Ambulatory Visit: Payer: Self-pay | Admitting: Cardiology

## 2019-04-15 DIAGNOSIS — E559 Vitamin D deficiency, unspecified: Secondary | ICD-10-CM | POA: Diagnosis not present

## 2019-04-15 DIAGNOSIS — M810 Age-related osteoporosis without current pathological fracture: Secondary | ICD-10-CM | POA: Diagnosis not present

## 2019-04-15 DIAGNOSIS — R5383 Other fatigue: Secondary | ICD-10-CM | POA: Diagnosis not present

## 2019-05-06 DIAGNOSIS — H35342 Macular cyst, hole, or pseudohole, left eye: Secondary | ICD-10-CM | POA: Diagnosis not present

## 2019-05-06 DIAGNOSIS — Z961 Presence of intraocular lens: Secondary | ICD-10-CM | POA: Diagnosis not present

## 2019-05-06 DIAGNOSIS — H43813 Vitreous degeneration, bilateral: Secondary | ICD-10-CM | POA: Diagnosis not present

## 2019-05-06 DIAGNOSIS — H40013 Open angle with borderline findings, low risk, bilateral: Secondary | ICD-10-CM | POA: Diagnosis not present

## 2019-05-14 ENCOUNTER — Ambulatory Visit: Payer: Medicare Other | Admitting: Cardiology

## 2019-05-20 ENCOUNTER — Encounter: Payer: Self-pay | Admitting: Cardiology

## 2019-05-20 ENCOUNTER — Other Ambulatory Visit: Payer: Self-pay

## 2019-05-20 ENCOUNTER — Ambulatory Visit: Payer: Medicare Other | Admitting: Cardiology

## 2019-05-20 VITALS — BP 126/62 | HR 73 | Temp 96.5°F | Resp 15 | Ht 62.0 in | Wt 140.0 lb

## 2019-05-20 DIAGNOSIS — I6523 Occlusion and stenosis of bilateral carotid arteries: Secondary | ICD-10-CM

## 2019-05-20 DIAGNOSIS — I10 Essential (primary) hypertension: Secondary | ICD-10-CM | POA: Diagnosis not present

## 2019-05-20 DIAGNOSIS — Z951 Presence of aortocoronary bypass graft: Secondary | ICD-10-CM

## 2019-05-20 DIAGNOSIS — Z9889 Other specified postprocedural states: Secondary | ICD-10-CM

## 2019-05-20 DIAGNOSIS — I2510 Atherosclerotic heart disease of native coronary artery without angina pectoris: Secondary | ICD-10-CM

## 2019-05-20 DIAGNOSIS — Z95828 Presence of other vascular implants and grafts: Secondary | ICD-10-CM | POA: Diagnosis not present

## 2019-05-20 DIAGNOSIS — E782 Mixed hyperlipidemia: Secondary | ICD-10-CM | POA: Diagnosis not present

## 2019-05-20 NOTE — Progress Notes (Addendum)
[place] Date of Birth: Aug 09, 1933 MRN: [id number] Primary Care Provider:Kim, Jeneen Rinks, MD  Former cardiology providers: Jeri Lager, MSN, APRN, FNP-C  Primary cardiology:Cailen Texeira Azalia B[facility] (established care 05/20/2019)   Date: 05/22/19 Last Office Visit: 11/13/2018  Chief Complaint  Patient presents with  . Follow-up    6 month  . Coronary Artery Disease  . Hypertension    HPI  [name] is a 86 y.o.  female who presents to the office with a chief complaint of " follow-up on heart disease." Patient's past medical history and cardiovascular risk factors include: Established coronary artery disease without angina pectoris, status post three-vessel CABG in 2007, mixed hyperlipidemia, carotid artery disease with history of right carotid endarterectomy and carotid stent, postmenopausal female, advanced age.  At the last office visit patient was seen and evaluated by Miquel Dunn at that time patient was doing well from a cardiovascular standpoint and was asked to follow-up in 6 months.  Since last office visit patient states that she has been doing well and does not have any chest pain or shortness of breath at rest or with effort related activities.  No hospitalizations or urgent care since last office visit.  She did undergo eye surgery which is gone well.  She follows up with Dr. Oneida Alar in regards to the management of her carotid artery disease.  Her functional status remains stable.  History of  myocardial infarction, coronary artery disease s/p CABG. Denies prior history of  congestive heart failure, deep venous thrombosis, pulmonary embolism, stroke, transient ischemic attack.  FUNCTIONAL STATUS: No structured exercise program or daily routine.  But she has 14 steps in her house that she goes up and down approximately 6-7 times per day without any effort related symptoms.  ALLERGIES: Allergies  Allergen Reactions  . Hydrocodone Shortness Of  Breath, Itching and Rash    Rash-itching-tongue swelled  . Fenofibrate Other (See Comments)    Caused every joint to become stif  . Latex Rash and Other (See Comments)    "Blisters in mouth"  . Oxycodone Itching, Swelling and Rash     MEDICATION LIST PRIOR TO VISIT: Current Outpatient Medications on File Prior to Visit  Medication Sig Dispense Refill  . acetaminophen (TYLENOL) 500 MG tablet Take 500 mg by mouth every 6 (six) hours as needed for moderate pain or headache.    Marland Kitchen aspirin 81 MG tablet Take 81 mg by mouth daily.    . Cholecalciferol (VITAMIN D) 50 MCG (2000 UT) tablet Take 2,000-4,000 Units by mouth See admin instructions. Take 4000 units in the morning and 2000 units at night    . ezetimibe (ZETIA) 10 MG tablet Take 10 mg by mouth every other day. Alternates zetia one day and crestor the next    . losartan-hydrochlorothiazide (HYZAAR) 100-12.5 MG tablet TAKE 1 TABLET BY MOUTH DAILY. 30 tablet 3  . metoprolol tartrate (LOPRESSOR) 25 MG tablet Take 1 tablet (25 mg total) by mouth 2 (two) times daily.    . Multiple Vitamins-Minerals (PRESERVISION AREDS 2) CAPS Take 1 capsule by mouth 2 (two) times daily.    . rosuvastatin (CRESTOR) 20 MG tablet Take 20 mg by mouth every other day. Alternates zetia one day and crestor the next    . Calcium Carbonate (CALCIUM 500 PO) Take 3 tablets by mouth every morning.    . denosumab (PROLIA) 60 MG/ML SOSY injection Inject 60 mg into the skin every 6 (six) months.      No current facility-administered medications  on file prior to visit.    PAST MEDICAL HISTORY: Past Medical History:  Diagnosis Date  . Arthritis   . Chronic cough   . Coronary artery disease   . Diverticulitis   . Hypercholesteremia   . Hypertension   . Internal carotid artery stenosis 08/2012  . Normal cardiac stress test    @ Dr Irven Shelling office  . Peripheral vascular disease ([place])     PAST SURGICAL HISTORY: Past Surgical History:  Procedure Laterality Date  . [place] VITRECTOMY WITH 20 GAUGE MVR PORT FOR MACULAR HOLE Left 10/07/2018   Procedure: 25 GAUGE PARS PLANA VITRECTOMY WITH 20 GAUGE MVR PORT;  Surgeon: [name], MD;  Location: [place];  Service: Ophthalmology;  Laterality: Left;  . ABDOMINAL HYSTERECTOMY    . APPENDECTOMY    . CARDIAC CATHETERIZATION    . CAROTID ANGIOGRAM N/A 04/01/2013   Procedure: CAROTID ANGIOGRAM;  Surgeon: Elam Dutch, MD;  Location: [facility] CATH LAB;  Service: Cardiovascular;  Laterality: N/A;  . CAROTID ENDARTERECTOMY Right August 25, 2012   cea  . CAROTID STENT INSERTION Right 04/22/2013   Procedure: CAROTID STENT INSERTION;  Surgeon: Elam Dutch, MD;  Location: [facility] CATH LAB;  Service: Cardiovascular;  Laterality: Right;  . CORONARY ARTERY BYPASS GRAFT    . ENDARTERECTOMY Right 08/25/2012   Procedure: ENDARTERECTOMY CAROTID;  Surgeon: Elam Dutch, MD;  Location: [place];  Service: Vascular;  Laterality: Right;  . EYE SURGERY     both cataracts  . FRACTURE SURGERY Left Jun 14, 2011   [place]  . GAS INSERTION Left 10/07/2018   Procedure: GAS INJECTION LEFT EYE;  Surgeon: [name], MD;  Location: [place];  Service: Ophthalmology;  Laterality: Left;  . MEMBRANE PEEL Left 10/07/2018   Procedure: INTERNAL LIMITING MEMBRANE PEEL;  Surgeon: [name], MD;  Location: [place];  Service: Ophthalmology;  Laterality: Left;  . ORIF ANKLE FRACTURE  06/14/2011   Procedure: OPEN REDUCTION INTERNAL FIXATION (ORIF) ANKLE FRACTURE;  Surgeon: Ninetta Lights, MD;  Location: [place];  Service: Orthopedics;  Laterality: Left;  left ankle fracture open treatment trimalleolar ankle includes internal fixation WITHOUT fixation of posterior lip  . PHOTOCOAGULATION WITH LASER Left 10/07/2018   Procedure: PHOTOCOAGULATION WITH LASER;  Surgeon: [name], MD;  Location: [place];  Service: Ophthalmology;  Laterality: Left;  . SERUM PATCH Left 10/07/2018   Procedure: LEFT EYE SERUM PATCH FOR MACULAR HOLE;   Surgeon: [name], MD;  Location: [place];  Service: Ophthalmology;  Laterality: Left;  . SHOULDER ARTHROSCOPY WITH SUBACROMIAL DECOMPRESSION, ROTATOR CUFF REPAIR AND BICEP TENDON REPAIR Left 06/05/2012   Procedure: LEFT SHOULDER ARTHROSCOPY WITH SUBACROMIAL DECOMPRESSION, PARTIAL ACROMIOPLASTY WITH CORACOACROMIAL RELEASE, DISTAL CLAVICULECTOMY, WITH ROTATOR CUFF REPAIR AND BICEP TENODESIS;  Surgeon: Ninetta Lights, MD;  Location: [place];  Service: Orthopedics;  Laterality: Left;    FAMILY HISTORY: The patient's family history includes Cancer in her father and sister; Diabetes in her sister; Heart disease in her brother and sister; Hyperlipidemia in her sister.   SOCIAL HISTORY:  The patient  reports that she has never smoked. She has never used smokeless tobacco. She reports current alcohol use of about 7.0 standard drinks of alcohol per week. She reports that she does not use drugs.  Review of Systems  Constitution: Negative for chills and fever.  HENT: Negative for ear discharge, ear pain and nosebleeds.   Eyes: Negative for blurred  vision and discharge.  Cardiovascular: Negative for chest pain, claudication, dyspnea on exertion, leg swelling, near-syncope, orthopnea, palpitations, paroxysmal nocturnal dyspnea and syncope.  Respiratory: Negative for cough and shortness of breath.   Endocrine: Negative for polydipsia, polyphagia and polyuria.  Hematologic/Lymphatic: Negative for bleeding problem.  Skin: Negative for flushing and nail changes.  Musculoskeletal: Negative for muscle cramps, muscle weakness and myalgias.  Gastrointestinal: Negative for abdominal pain, dysphagia, hematemesis, hematochezia, melena, nausea and vomiting.  Neurological: Negative for dizziness, focal weakness and light-headedness.    PHYSICAL EXAM: Vitals with BMI 05/20/2019 11/13/2018 10/08/2018  Height _0  _1  -  Weight 140 lbs 142 lbs -  BMI 57.2 62.03 -  Systolic 559 741 638    Diastolic 62 66 79  Pulse 73 61 84    CONSTITUTIONAL: Well-developed and well-nourished. No acute distress.  SKIN: Skin is warm and dry. No rash noted. No cyanosis. No pallor. No jaundice HEAD: Normocephalic and atraumatic.  EYES: No scleral icterus MOUTH/THROAT: Moist oral membranes.  NECK: No JVD present. No thyromegaly noted.  Right carotid endarterectomy site is clean dry and intact LYMPHATIC: No visible cervical adenopathy.  CHEST Normal respiratory effort. No intercostal retractions.  Sternotomy site is well-healed LUNGS: Clear to auscultation bilaterally.  No stridor. No wheezes. No rales.  CARDIOVASCULAR: Regular rate and rhythm, positive S1-S2, no murmurs rubs or gallops appreciated. ABDOMINAL: No apparent ascites.  EXTREMITIES: No peripheral edema  HEMATOLOGIC: No significant bruising NEUROLOGIC: Oriented to person, place, and time. Nonfocal. Normal muscle tone.  PSYCHIATRIC: Normal mood and affect. Normal behavior. Cooperative  CARDIAC DATABASE: EKG: 05/20/2019: Normal sinus rhythm ventricular rate of 69 bpm, normal axis, underlying ischemia or injury pattern.  Echocardiogram: 03/22/2018: LVEF 55-60%, severe basal septal hypertrophy mild MAC, aortic valve sclerosis, intra-atrial septum appears lipomatous.  Stress Testing:  08/18/2012: Myoview stress test reported normal isotope uptake in both rest and stress images.  No evidence of ischemia or scar.  LVEF by gated SPECT 88%.  LABORATORY DATA: CBC Latest Ref Rng & Units 10/07/2018 03/23/2018 03/22/2018  WBC 4.0 - 10.5 K/uL 5.1 6.0 6.2  Hemoglobin 12.0 - 15.0 g/dL 13.6 11.9(L) 12.1  Hematocrit 36.0 - 46.0 % 41.1 37.4 38.2  Platelets 150 - 400 K/uL 222 276 298    CMP Latest Ref Rng & Units 10/07/2018 03/23/2018 03/22/2018  Glucose 70 - 99 mg/dL 103(H) 84 85  BUN 8 - 23 mg/dL _2 Creatinine 0.44 - 1.00 mg/dL 0.77 0.79 0.85  Sodium 135 - 145 mmol/L 137 136 139  Potassium 3.5 - 5.1 mmol/L 3.8 4.4 5.1  Chloride 98 - 111  mmol/L 98 100 104  CO2 22 - 32 mmol/L 27 33(H) 29  Calcium 8.9 - 10.3 mg/dL 9.2 8.6(L) 8.7(L)  Total Protein 6.5 - 8.1 g/dL - - -  Total Bilirubin 0.3 - 1.2 mg/dL - - -  Alkaline Phos 38 - 126 U/L - - -  AST 15 - 41 U/L - - -  ALT 0 - 44 U/L - - -    FINAL MEDICATION LIST END OF ENCOUNTER: No orders of the defined types were placed in this encounter.   Medications Discontinued During This Encounter  Medication Reason  . bacitracin-polymyxin b (POLYSPORIN) ophthalmic ointment Patient Preference  . gatifloxacin (ZYMAXID) 0.5 % SOLN Patient Preference  . prednisoLONE acetate (PRED FORTE) 1 % ophthalmic suspension Patient Preference     Current Outpatient Medications:  .  acetaminophen (TYLENOL) 500 MG tablet, Take 500 mg by mouth every  6 (six) hours as needed for moderate pain or headache., Disp: , Rfl:  .  aspirin 81 MG tablet, Take 81 mg by mouth daily., Disp: , Rfl:  .  Cholecalciferol (VITAMIN D) 50 MCG (2000 UT) tablet, Take 2,000-4,000 Units by mouth See admin instructions. Take 4000 units in the morning and 2000 units at night, Disp: , Rfl:  .  ezetimibe (ZETIA) 10 MG tablet, Take 10 mg by mouth every other day. Alternates zetia one day and crestor the next, Disp: , Rfl:  .  losartan-hydrochlorothiazide (HYZAAR) 100-12.5 MG tablet, TAKE 1 TABLET BY MOUTH DAILY., Disp: 30 tablet, Rfl: 3 .  metoprolol tartrate (LOPRESSOR) 25 MG tablet, Take 1 tablet (25 mg total) by mouth 2 (two) times daily., Disp: , Rfl:  .  Multiple Vitamins-Minerals (PRESERVISION AREDS 2) CAPS, Take 1 capsule by mouth 2 (two) times daily., Disp: , Rfl:  .  rosuvastatin (CRESTOR) 20 MG tablet, Take 20 mg by mouth every other day. Alternates zetia one day and crestor the next, Disp: , Rfl:  .  Calcium Carbonate (CALCIUM 500 PO), Take 3 tablets by mouth every morning., Disp: , Rfl:  .  denosumab (PROLIA) 60 MG/ML SOSY injection, Inject 60 mg into the skin every 6 (six) months. , Disp: , Rfl:   IMPRESSION:     ICD-10-CM   1. Coronary artery disease involving native coronary artery of native heart without angina pectoris  I25.10   2. Essential hypertension  I10 EKG 12-Lead  3. Hx of CABG  Z95.1   4. Carotid stenosis, bilateral  I65.23   5. History of right-sided carotid endarterectomy  Z98.890   6. History of right carotid artery stent placement  Z98.890    Z95.828   7. Mixed hyperlipidemia  E78.2      RECOMMENDATIONS: [place] Daughtridge Nault is a 86 y.o. female whose past medical history and cardiovascular risk factors include: Established coronary artery disease without angina pectoris, status post three-vessel CABG in 2007, mixed hyperlipidemia, carotid artery disease with history of right carotid endarterectomy and carotid stent, postmenopausal female, advanced age.  Coronary artery disease without angina pectoris:  Since last office visit patient remained stable from a cardiovascular standpoint.  Patient is able to do her activities of daily living and is functional without having any effort related symptoms of chest pain or shortness of breath.  Most recent echocardiogram results reviewed and noted above.  EKG today shows normal sinus without underlying ischemia or injury pattern.  Patient's last nuclear stress test was performed in 2014.  Recommended to repeat stress test given her history of coronary artery disease status post CABG.  However, patient states that she is currently asymptomatic and would like to treat her disease medically.  Agree with the current plan of care.  History of CABG: See above  Carotid artery atherosclerosis: Patient follows up with Dr. Oneida Alar twice a year.  Continue to follow.  Mixed hyperlipidemia: Continue statin therapy and Zetia.  Patient does not endorse any myalgias.  Currently managed by primary team.  Orders Placed This Encounter  Procedures  . EKG 12-Lead   --Continue cardiac medications as reconciled in final medication list. --Return in  about 6 months (around 11/19/2019). Or sooner if needed. --Continue follow-up with your primary care physician regarding the management of your other chronic comorbid conditions.  Patient's questions and concerns were addressed to her satisfaction. She voices understanding of the instructions provided during this encounter.   This note was created using a voice recognition software  as a result there may be grammatical errors inadvertently enclosed that do not reflect the nature of this encounter. Every attempt is made to correct such errors.  Rex Kras, [place], [facility]  Pager: [phone] Office: [phone]

## 2019-07-17 ENCOUNTER — Other Ambulatory Visit: Payer: Self-pay | Admitting: Cardiology

## 2019-08-26 DIAGNOSIS — E559 Vitamin D deficiency, unspecified: Secondary | ICD-10-CM | POA: Diagnosis not present

## 2019-08-26 DIAGNOSIS — E7800 Pure hypercholesterolemia, unspecified: Secondary | ICD-10-CM | POA: Diagnosis not present

## 2019-08-26 DIAGNOSIS — I10 Essential (primary) hypertension: Secondary | ICD-10-CM | POA: Diagnosis not present

## 2019-09-02 DIAGNOSIS — I10 Essential (primary) hypertension: Secondary | ICD-10-CM | POA: Diagnosis not present

## 2019-09-02 DIAGNOSIS — E7800 Pure hypercholesterolemia, unspecified: Secondary | ICD-10-CM | POA: Diagnosis not present

## 2019-09-02 DIAGNOSIS — I2510 Atherosclerotic heart disease of native coronary artery without angina pectoris: Secondary | ICD-10-CM | POA: Diagnosis not present

## 2019-09-02 DIAGNOSIS — Z0000 Encounter for general adult medical examination without abnormal findings: Secondary | ICD-10-CM | POA: Diagnosis not present

## 2019-09-14 ENCOUNTER — Other Ambulatory Visit: Payer: Self-pay | Admitting: Sports Medicine

## 2019-09-14 DIAGNOSIS — M810 Age-related osteoporosis without current pathological fracture: Secondary | ICD-10-CM

## 2019-10-08 ENCOUNTER — Ambulatory Visit
Admission: RE | Admit: 2019-10-08 | Discharge: 2019-10-08 | Disposition: A | Discharge status: Home / Self Care | Payer: Medicare Other | Source: Ambulatory Visit | Attending: Sports Medicine | Admitting: Sports Medicine

## 2019-10-08 ENCOUNTER — Other Ambulatory Visit: Payer: Self-pay

## 2019-10-08 DIAGNOSIS — Z780 Asymptomatic menopausal state: Secondary | ICD-10-CM | POA: Diagnosis not present

## 2019-10-08 DIAGNOSIS — M8589 Other specified disorders of bone density and structure, multiple sites: Secondary | ICD-10-CM | POA: Diagnosis not present

## 2019-10-08 DIAGNOSIS — M810 Age-related osteoporosis without current pathological fracture: Secondary | ICD-10-CM

## 2019-10-15 DIAGNOSIS — M810 Age-related osteoporosis without current pathological fracture: Secondary | ICD-10-CM | POA: Diagnosis not present

## 2019-10-15 DIAGNOSIS — E559 Vitamin D deficiency, unspecified: Secondary | ICD-10-CM | POA: Diagnosis not present

## 2019-10-15 DIAGNOSIS — R5383 Other fatigue: Secondary | ICD-10-CM | POA: Diagnosis not present

## 2019-10-21 DIAGNOSIS — H35342 Macular cyst, hole, or pseudohole, left eye: Secondary | ICD-10-CM | POA: Diagnosis not present

## 2019-10-21 DIAGNOSIS — Z961 Presence of intraocular lens: Secondary | ICD-10-CM | POA: Diagnosis not present

## 2019-10-21 DIAGNOSIS — H532 Diplopia: Secondary | ICD-10-CM | POA: Diagnosis not present

## 2019-10-21 DIAGNOSIS — M810 Age-related osteoporosis without current pathological fracture: Secondary | ICD-10-CM | POA: Diagnosis not present

## 2019-10-21 DIAGNOSIS — H40013 Open angle with borderline findings, low risk, bilateral: Secondary | ICD-10-CM | POA: Diagnosis not present

## 2019-10-21 DIAGNOSIS — H43813 Vitreous degeneration, bilateral: Secondary | ICD-10-CM | POA: Diagnosis not present

## 2019-11-19 ENCOUNTER — Ambulatory Visit: Payer: Medicare Other | Admitting: Cardiology

## 2019-11-25 ENCOUNTER — Ambulatory Visit: Payer: Medicare Other | Admitting: Cardiology

## 2019-11-25 ENCOUNTER — Other Ambulatory Visit: Payer: Self-pay

## 2019-11-25 ENCOUNTER — Encounter: Payer: Self-pay | Admitting: Cardiology

## 2019-11-25 VITALS — BP 158/63 | HR 71 | Ht 62.0 in | Wt 143.0 lb

## 2019-11-25 DIAGNOSIS — I6523 Occlusion and stenosis of bilateral carotid arteries: Secondary | ICD-10-CM

## 2019-11-25 DIAGNOSIS — Z9889 Other specified postprocedural states: Secondary | ICD-10-CM

## 2019-11-25 DIAGNOSIS — Z951 Presence of aortocoronary bypass graft: Secondary | ICD-10-CM

## 2019-11-25 DIAGNOSIS — I2510 Atherosclerotic heart disease of native coronary artery without angina pectoris: Secondary | ICD-10-CM

## 2019-11-25 DIAGNOSIS — Z95828 Presence of other vascular implants and grafts: Secondary | ICD-10-CM

## 2019-11-25 DIAGNOSIS — E782 Mixed hyperlipidemia: Secondary | ICD-10-CM

## 2019-11-25 DIAGNOSIS — I10 Essential (primary) hypertension: Secondary | ICD-10-CM

## 2019-11-25 NOTE — Progress Notes (Signed)
[place] Date of Birth: [birth year] MRN: [id number] Primary Care Provider:Kim, Jeneen Rinks, MD  Former cardiology providers: Jeri Lager, MSN, APRN, FNP-C  Primary cardiology:Dina Mobley Azalia Bilis [facility] (established care 05/20/2019)  Date: 11/25/19 Last Office Visit: 05/20/2019  Chief Complaint  Patient presents with  . Coronary Artery Disease  . Follow-up    HPI  [name] is a 86 y.o.  female who presents to the office with a chief complaint of " follow-up on heart disease." Patient's past medical history and cardiovascular risk factors include: Established coronary artery disease without angina pectoris, status post three-vessel CABG in 2007, mixed hyperlipidemia, carotid artery disease with history of right carotid endarterectomy and carotid stent, postmenopausal female, advanced age.  At the last office visit patient was seen and evaluated by Miquel Dunn at that time patient was doing well from a cardiovascular standpoint and was asked to follow-up in 6 months.  Since last office visit patient states that she has been doing well and does not have any chest pain or shortness of breath at rest or with effort related activities.  No hospitalizations or urgent care since last office visit.    She follows up with Dr. Oneida Alar in regards to the management of her carotid artery disease. Most recent carotid duplex reviewed with the patient.   Since the last visit she has also follow up with her PCP and had her labs checked including lipid profile. She states that lipid profile was well controlled, I do not have results to review at today's office visit.   Her functional status remains stable.  History of  myocardial infarction, coronary artery disease s/p CABG. Denies prior history of  congestive heart failure, deep venous thrombosis, pulmonary embolism, stroke, transient ischemic attack.  FUNCTIONAL STATUS: No structured exercise program or daily routine.  But she has  14 steps in her house that she goes up and down approximately 6-7 times per day without any effort related symptoms.  ALLERGIES: Allergies  Allergen Reactions  . Hydrocodone Shortness Of Breath, Itching and Rash    Rash-itching-tongue swelled  . Fenofibrate Other (See Comments)    Caused every joint to become stif  . Latex Rash and Other (See Comments)    "Blisters in mouth"  . Oxycodone Itching, Swelling and Rash     MEDICATION LIST PRIOR TO VISIT: Current Outpatient Medications on File Prior to Visit  Medication Sig Dispense Refill  . acetaminophen (TYLENOL) 500 MG tablet Take 500 mg by mouth every 6 (six) hours as needed for moderate pain or headache.    Marland Kitchen aspirin 81 MG tablet Take 81 mg by mouth daily.    . Calcium Carbonate (CALCIUM 500 PO) Take 3 tablets by mouth every morning.    . Cholecalciferol (VITAMIN D) 50 MCG (2000 UT) tablet Take 2,000-4,000 Units by mouth See admin instructions. Take 4000 units in the morning and 2000 units at night    . denosumab (PROLIA) 60 MG/ML SOSY injection Inject 60 mg into the skin every 6 (six) months.     . ezetimibe (ZETIA) 10 MG tablet Take 10 mg by mouth every other day. Alternates zetia one day and crestor the next    . losartan-hydrochlorothiazide (HYZAAR) 100-12.5 MG tablet TAKE 1 TABLET BY MOUTH DAILY. 90 tablet 0  . metoprolol tartrate (LOPRESSOR) 25 MG tablet Take 1 tablet (25 mg total) by mouth 2 (two) times daily.    . Multiple Vitamins-Minerals (PRESERVISION AREDS 2) CAPS Take 1 capsule by mouth 2 (two) times daily.    Marland Kitchen  rosuvastatin (CRESTOR) 20 MG tablet Take 20 mg by mouth every other day. Alternates zetia one day and crestor the next     No current facility-administered medications on file prior to visit.    PAST MEDICAL HISTORY: Past Medical History:  Diagnosis Date  . Arthritis   . Chronic cough   . Coronary artery disease   . Diverticulitis   . Hypercholesteremia   . Hypertension   . Internal carotid artery  stenosis 08/2012  . Normal cardiac stress test    @ Dr Irven Shelling office  . Peripheral vascular disease ([place])     PAST SURGICAL HISTORY: Past Surgical History:  Procedure Laterality Date  . [place] VITRECTOMY WITH 20 GAUGE MVR PORT FOR MACULAR HOLE Left 10/07/2018   Procedure: 25 GAUGE PARS PLANA VITRECTOMY WITH 20 GAUGE MVR PORT;  Surgeon: [name], MD;  Location: [place];  Service: Ophthalmology;  Laterality: Left;  . ABDOMINAL HYSTERECTOMY    . APPENDECTOMY    . CARDIAC CATHETERIZATION    . CAROTID ANGIOGRAM N/A 04/01/2013   Procedure: CAROTID ANGIOGRAM;  Surgeon: Elam Dutch, MD;  Location: [facility] CATH LAB;  Service: Cardiovascular;  Laterality: N/A;  . CAROTID ENDARTERECTOMY Right August 25, 2012   cea  . CAROTID STENT INSERTION Right 04/22/2013   Procedure: CAROTID STENT INSERTION;  Surgeon: Elam Dutch, MD;  Location: [facility] CATH LAB;  Service: Cardiovascular;  Laterality: Right;  . CORONARY ARTERY BYPASS GRAFT    . ENDARTERECTOMY Right 08/25/2012   Procedure: ENDARTERECTOMY CAROTID;  Surgeon: Elam Dutch, MD;  Location: [place];  Service: Vascular;  Laterality: Right;  . EYE SURGERY     both cataracts  . FRACTURE SURGERY Left Jun 14, 2011   [place]  . GAS INSERTION Left 10/07/2018   Procedure: GAS INJECTION LEFT EYE;  Surgeon: [name], MD;  Location: [place];  Service: Ophthalmology;  Laterality: Left;  . MEMBRANE PEEL Left 10/07/2018   Procedure: INTERNAL LIMITING MEMBRANE PEEL;  Surgeon: [name], MD;  Location: [place];  Service: Ophthalmology;  Laterality: Left;  . ORIF ANKLE FRACTURE  06/14/2011   Procedure: OPEN REDUCTION INTERNAL FIXATION (ORIF) ANKLE FRACTURE;  Surgeon: Ninetta Lights, MD;  Location: [place];  Service: Orthopedics;  Laterality: Left;  left ankle fracture open treatment trimalleolar ankle includes internal fixation WITHOUT fixation of posterior lip  . PHOTOCOAGULATION WITH LASER Left 10/07/2018   Procedure:  PHOTOCOAGULATION WITH LASER;  Surgeon: [name], MD;  Location: [place];  Service: Ophthalmology;  Laterality: Left;  . SERUM PATCH Left 10/07/2018   Procedure: LEFT EYE SERUM PATCH FOR MACULAR HOLE;  Surgeon: [name], MD;  Location: [place];  Service: Ophthalmology;  Laterality: Left;  . SHOULDER ARTHROSCOPY WITH SUBACROMIAL DECOMPRESSION, ROTATOR CUFF REPAIR AND BICEP TENDON REPAIR Left 06/05/2012   Procedure: LEFT SHOULDER ARTHROSCOPY WITH SUBACROMIAL DECOMPRESSION, PARTIAL ACROMIOPLASTY WITH CORACOACROMIAL RELEASE, DISTAL CLAVICULECTOMY, WITH ROTATOR CUFF REPAIR AND BICEP TENODESIS;  Surgeon: Ninetta Lights, MD;  Location: [place];  Service: Orthopedics;  Laterality: Left;    FAMILY HISTORY: The patient's family history includes Cancer in her father and sister; Diabetes in her sister; Heart disease in her brother and sister; Hyperlipidemia in her sister.   SOCIAL HISTORY:  The patient  reports that she has never smoked. She has never used smokeless tobacco. She reports current alcohol use of about 7.0 standard drinks of alcohol per week. She reports that she does not  use drugs.  Review of Systems  Constitutional: Negative for chills and fever.  HENT: Negative for ear discharge, ear pain and nosebleeds.   Eyes: Negative for blurred vision and discharge.  Cardiovascular: Negative for chest pain, claudication, dyspnea on exertion, leg swelling, near-syncope, orthopnea, palpitations, paroxysmal nocturnal dyspnea and syncope.  Respiratory: Negative for cough and shortness of breath.   Endocrine: Negative for polydipsia, polyphagia and polyuria.  Hematologic/Lymphatic: Negative for bleeding problem.  Skin: Negative for flushing and nail changes.  Musculoskeletal: Negative for muscle cramps, muscle weakness and myalgias.  Gastrointestinal: Negative for abdominal pain, dysphagia, hematemesis, hematochezia, melena, nausea and vomiting.  Neurological: Negative for  dizziness, focal weakness and light-headedness.    PHYSICAL EXAM: Vitals with BMI 11/25/2019 05/20/2019 11/13/2018  Height _0  _1  _2   Weight 143 lbs 140 lbs 142 lbs  BMI 26.15 25.3 66.44  Systolic 034 742 595  Diastolic 63 62 66  Pulse 71 73 61    CONSTITUTIONAL: Well-developed and well-nourished. No acute distress.  SKIN: Skin is warm and dry. No rash noted. No cyanosis. No pallor. No jaundice HEAD: Normocephalic and atraumatic.  EYES: No scleral icterus MOUTH/THROAT: Moist oral membranes.  NECK: No JVD present. No thyromegaly noted.  Right carotid endarterectomy site is clean dry and intact LYMPHATIC: No visible cervical adenopathy.  CHEST Normal respiratory effort. No intercostal retractions.  Sternotomy site is well-healed LUNGS: Clear to auscultation bilaterally.  No stridor. No wheezes. No rales.  CARDIOVASCULAR: Regular rate and rhythm, positive S1-S2, no murmurs rubs or gallops appreciated. ABDOMINAL: No apparent ascites.  EXTREMITIES: No peripheral edema  HEMATOLOGIC: No significant bruising NEUROLOGIC: Oriented to person, place, and time. Nonfocal. Normal muscle tone.  PSYCHIATRIC: Normal mood and affect. Normal behavior. Cooperative  CARDIAC DATABASE: EKG: 11/25/2019: Sinus  Rhythm, 74bpm, normal axis, without underlying ischemia or injury.   Echocardiogram: 03/22/2018: LVEF 55-60%, severe basal septal hypertrophy mild MAC, aortic valve sclerosis, intra-atrial septum appears lipomatous.  Stress Testing:  08/18/2012: Myoview stress test reported normal isotope uptake in both rest and stress images.  No evidence of ischemia or scar.  LVEF by gated SPECT 88%.  LABORATORY DATA: CBC Latest Ref Rng & Units 10/07/2018 03/23/2018 03/22/2018  WBC 4.0 - 10.5 K/uL 5.1 6.0 6.2  Hemoglobin 12.0 - 15.0 g/dL 13.6 11.9(L) 12.1  Hematocrit 36 - 46 % 41.1 37.4 38.2  Platelets 150 - 400 K/uL 222 276 298    CMP Latest Ref Rng & Units 10/07/2018 03/23/2018 03/22/2018  Glucose 70 -  99 mg/dL 103(H) 84 85  BUN 8 - 23 mg/dL _3 Creatinine 0.44 - 1.00 mg/dL 0.77 0.79 0.85  Sodium 135 - 145 mmol/L 137 136 139  Potassium 3.5 - 5.1 mmol/L 3.8 4.4 5.1  Chloride 98 - 111 mmol/L 98 100 104  CO2 22 - 32 mmol/L 27 33(H) 29  Calcium 8.9 - 10.3 mg/dL 9.2 8.6(L) 8.7(L)  Total Protein 6.5 - 8.1 g/dL - - -  Total Bilirubin 0.3 - 1.2 mg/dL - - -  Alkaline Phos 38 - 126 U/L - - -  AST 15 - 41 U/L - - -  ALT 0 - 44 U/L - - -    FINAL MEDICATION LIST END OF ENCOUNTER: No orders of the defined types were placed in this encounter.   There are no discontinued medications.   Current Outpatient Medications:  .  acetaminophen (TYLENOL) 500 MG tablet, Take 500 mg by mouth every 6 (six) hours as needed for moderate pain  or headache., Disp: , Rfl:  .  aspirin 81 MG tablet, Take 81 mg by mouth daily., Disp: , Rfl:  .  Calcium Carbonate (CALCIUM 500 PO), Take 3 tablets by mouth every morning., Disp: , Rfl:  .  Cholecalciferol (VITAMIN D) 50 MCG (2000 UT) tablet, Take 2,000-4,000 Units by mouth See admin instructions. Take 4000 units in the morning and 2000 units at night, Disp: , Rfl:  .  denosumab (PROLIA) 60 MG/ML SOSY injection, Inject 60 mg into the skin every 6 (six) months. , Disp: , Rfl:  .  ezetimibe (ZETIA) 10 MG tablet, Take 10 mg by mouth every other day. Alternates zetia one day and crestor the next, Disp: , Rfl:  .  losartan-hydrochlorothiazide (HYZAAR) 100-12.5 MG tablet, TAKE 1 TABLET BY MOUTH DAILY., Disp: 90 tablet, Rfl: 0 .  metoprolol tartrate (LOPRESSOR) 25 MG tablet, Take 1 tablet (25 mg total) by mouth 2 (two) times daily., Disp: , Rfl:  .  Multiple Vitamins-Minerals (PRESERVISION AREDS 2) CAPS, Take 1 capsule by mouth 2 (two) times daily., Disp: , Rfl:  .  rosuvastatin (CRESTOR) 20 MG tablet, Take 20 mg by mouth every other day. Alternates zetia one day and crestor the next, Disp: , Rfl:   IMPRESSION:    ICD-10-CM   1. Coronary artery disease involving native  coronary artery of native heart without angina pectoris  I25.10 EKG 12-Lead  2. Hx of CABG  Z95.1   3. Essential hypertension  I10   4. Carotid stenosis, bilateral  I65.23   5. History of right-sided carotid endarterectomy  Z98.890   6. History of right carotid artery stent placement  Z98.890    Z95.828   7. Mixed hyperlipidemia  E78.2      RECOMMENDATIONS: [place] Daughtridge Bober is a 86 y.o. female whose past medical history and cardiovascular risk factors include: Established coronary artery disease without angina pectoris, status post three-vessel CABG in 2007, mixed hyperlipidemia, carotid artery disease with history of right carotid endarterectomy and carotid stent, postmenopausal female, advanced age.  Coronary artery disease without angina pectoris:  Since last office visit patient remained stable from a cardiovascular standpoint.  Patient is able to do her activities of daily living and is functional without having any effort related symptoms of chest pain or shortness of breath.  Most recent echocardiogram results reviewed and noted above.  EKG today shows normal sinus without underlying ischemia or injury pattern.  Recommended to repeat stress test given her history of coronary artery disease status post CABG.  However, patient states that she is currently asymptomatic and may re-consider this at the next office visit.   History of CABG: See above  Carotid artery atherosclerosis: Patient follows up with Dr. Oneida Alar twice a year.  Continue to follow.  Mixed hyperlipidemia: Continue statin therapy and Zetia.  Patient does not endorse any myalgias.  Currently managed by primary team.  Orders Placed This Encounter  Procedures  . EKG 12-Lead   --Continue cardiac medications as reconciled in final medication list. --Return in about 6 months (around 05/25/2020) for Reevaluation of, CAD. Or sooner if needed. --Continue follow-up with your primary care physician regarding the  management of your other chronic comorbid conditions.  Patient's questions and concerns were addressed to her satisfaction. She voices understanding of the instructions provided during this encounter.   This note was created using a voice recognition software as a result there may be grammatical errors inadvertently enclosed that do not reflect the nature of this encounter. Every  attempt is made to correct such errors.  Rex Kras, [place], [facility]  Pager: [phone] Office: [phone]

## 2019-12-10 ENCOUNTER — Other Ambulatory Visit: Payer: Self-pay | Admitting: Internal Medicine

## 2019-12-10 DIAGNOSIS — Z1231 Encounter for screening mammogram for malignant neoplasm of breast: Secondary | ICD-10-CM

## 2020-01-14 ENCOUNTER — Encounter (INDEPENDENT_AMBULATORY_CARE_PROVIDER_SITE_OTHER): Payer: Medicare Other | Admitting: Ophthalmology

## 2020-01-15 ENCOUNTER — Other Ambulatory Visit: Payer: Self-pay

## 2020-01-15 ENCOUNTER — Encounter (INDEPENDENT_AMBULATORY_CARE_PROVIDER_SITE_OTHER): Payer: Medicare Other | Admitting: Ophthalmology

## 2020-01-15 DIAGNOSIS — I10 Essential (primary) hypertension: Secondary | ICD-10-CM | POA: Diagnosis not present

## 2020-01-15 DIAGNOSIS — H35342 Macular cyst, hole, or pseudohole, left eye: Secondary | ICD-10-CM | POA: Diagnosis not present

## 2020-01-15 DIAGNOSIS — H353111 Nonexudative age-related macular degeneration, right eye, early dry stage: Secondary | ICD-10-CM

## 2020-01-15 DIAGNOSIS — H43811 Vitreous degeneration, right eye: Secondary | ICD-10-CM

## 2020-01-15 DIAGNOSIS — H35033 Hypertensive retinopathy, bilateral: Secondary | ICD-10-CM

## 2020-01-20 ENCOUNTER — Ambulatory Visit: Payer: Medicare Other

## 2020-02-10 DIAGNOSIS — Z961 Presence of intraocular lens: Secondary | ICD-10-CM | POA: Diagnosis not present

## 2020-02-10 DIAGNOSIS — H50112 Monocular exotropia, left eye: Secondary | ICD-10-CM | POA: Diagnosis not present

## 2020-02-10 DIAGNOSIS — H532 Diplopia: Secondary | ICD-10-CM | POA: Diagnosis not present

## 2020-02-24 DIAGNOSIS — E7800 Pure hypercholesterolemia, unspecified: Secondary | ICD-10-CM | POA: Diagnosis not present

## 2020-03-02 ENCOUNTER — Ambulatory Visit: Payer: Medicare Other

## 2020-03-03 DIAGNOSIS — I2510 Atherosclerotic heart disease of native coronary artery without angina pectoris: Secondary | ICD-10-CM | POA: Diagnosis not present

## 2020-03-03 DIAGNOSIS — F418 Other specified anxiety disorders: Secondary | ICD-10-CM | POA: Diagnosis not present

## 2020-03-03 DIAGNOSIS — I6529 Occlusion and stenosis of unspecified carotid artery: Secondary | ICD-10-CM | POA: Diagnosis not present

## 2020-03-03 DIAGNOSIS — E559 Vitamin D deficiency, unspecified: Secondary | ICD-10-CM | POA: Diagnosis not present

## 2020-03-03 DIAGNOSIS — E785 Hyperlipidemia, unspecified: Secondary | ICD-10-CM | POA: Diagnosis not present

## 2020-03-03 DIAGNOSIS — R739 Hyperglycemia, unspecified: Secondary | ICD-10-CM | POA: Diagnosis not present

## 2020-03-03 DIAGNOSIS — I10 Essential (primary) hypertension: Secondary | ICD-10-CM | POA: Diagnosis not present

## 2020-03-03 DIAGNOSIS — M810 Age-related osteoporosis without current pathological fracture: Secondary | ICD-10-CM | POA: Diagnosis not present

## 2020-04-20 DIAGNOSIS — E559 Vitamin D deficiency, unspecified: Secondary | ICD-10-CM | POA: Diagnosis not present

## 2020-04-20 DIAGNOSIS — M810 Age-related osteoporosis without current pathological fracture: Secondary | ICD-10-CM | POA: Diagnosis not present

## 2020-05-25 ENCOUNTER — Ambulatory Visit: Payer: Medicare Other | Admitting: Cardiology

## 2020-06-15 DIAGNOSIS — H5010 Unspecified exotropia: Secondary | ICD-10-CM | POA: Diagnosis not present

## 2020-06-15 DIAGNOSIS — H50332 Intermittent monocular exotropia, left eye: Secondary | ICD-10-CM | POA: Diagnosis not present

## 2020-06-15 DIAGNOSIS — H53032 Strabismic amblyopia, left eye: Secondary | ICD-10-CM | POA: Diagnosis not present

## 2020-06-17 DIAGNOSIS — Z23 Encounter for immunization: Secondary | ICD-10-CM | POA: Diagnosis not present

## 2020-06-22 ENCOUNTER — Encounter (HOSPITAL_COMMUNITY): Payer: Self-pay | Admitting: Ophthalmology

## 2020-06-22 ENCOUNTER — Other Ambulatory Visit: Payer: Self-pay

## 2020-06-22 ENCOUNTER — Other Ambulatory Visit (HOSPITAL_COMMUNITY)
Admission: RE | Admit: 2020-06-22 | Discharge: 2020-06-22 | Disposition: A | Discharge status: Home / Self Care | Payer: Medicare Other | Source: Ambulatory Visit | Attending: Ophthalmology | Admitting: Ophthalmology

## 2020-06-22 DIAGNOSIS — Z01812 Encounter for preprocedural laboratory examination: Secondary | ICD-10-CM | POA: Insufficient documentation

## 2020-06-22 DIAGNOSIS — Z20822 Contact with and (suspected) exposure to covid-19: Secondary | ICD-10-CM | POA: Diagnosis not present

## 2020-06-22 LAB — SARS CORONAVIRUS 2 (TAT 6-24 HRS): SARS Coronavirus 2: NEGATIVE

## 2020-06-22 NOTE — Progress Notes (Signed)
Patient denies shortness of breath, fever, cough or chest pain.  Internal Med - Janie Morning, DO Cardiologist - Rex Kras, DO  CT Chest x-ray - 09/17/18 CE EKG - 11/25/19 Stress Test - 08/18/12 Normal ECHO - 03/22/18 Cardiac Cath - 2007  Aspirin Instructions: Follow your surgeon's instructions on when to stop aspirin prior to surgery,  If no instructions were given by your surgeon then you will need to call the office for those instructions.  Anesthesia review: Yes  STOP now taking any Aspirin (unless otherwise instructed by your surgeon), Aleve, Naproxen, Ibuprofen, Motrin, Advil, Goody's, BC's, all herbal medications, fish oil, and all vitamins.   Coronavirus Screening Covid test on 06/22/20 Do you have any of the following symptoms:  Cough yes/no: No Fever (>100.29F)  yes/no: No Runny nose yes/no: No Sore throat yes/no: No Difficulty breathing/shortness of breath  yes/no: No  Have you traveled in the last 14 days and where? yes/no: No  Patient verbalized understanding of instructions that were given via phone.

## 2020-06-23 NOTE — Anesthesia Preprocedure Evaluation (Addendum)
Anesthesia Evaluation  Patient identified by MRN, date of birth, ID band  Reviewed: Allergy & Precautions, NPO status , Patient's Chart, lab work & pertinent test results  Airway Mallampati: II  TM Distance: >3 FB Neck ROM: Full    Dental  (+) Partial Upper   Pulmonary neg pulmonary ROS,    Pulmonary exam normal breath sounds clear to auscultation       Cardiovascular hypertension, Pt. on medications + CAD and + Peripheral Vascular Disease  Normal cardiovascular exam Rhythm:Regular Rate:Normal     Neuro/Psych PSYCHIATRIC DISORDERS Depression    GI/Hepatic negative GI ROS, Neg liver ROS,   Endo/Other  negative endocrine ROS  Renal/GU negative Renal ROS  negative genitourinary   Musculoskeletal  (+) Arthritis , Osteoarthritis,    Abdominal Normal abdominal exam  (+)   Peds negative pediatric ROS (+)  Hematology negative hematology ROS (+)   Anesthesia Other Findings   Reproductive/Obstetrics negative OB ROS                            Anesthesia Physical Anesthesia Plan  ASA: III  Anesthesia Plan: General   Post-op Pain Management:    Induction: Intravenous  PONV Risk Score and Plan: 3 and Treatment may vary due to age or medical condition and Ondansetron  Airway Management Planned: Oral ETT  Additional Equipment: None  Intra-op Plan:   Post-operative Plan: Extubation in OR  Informed Consent: I have reviewed the patients History and Physical, chart, labs and discussed the procedure including the risks, benefits and alternatives for the proposed anesthesia with the patient or authorized representative who has indicated his/her understanding and acceptance.     Dental advisory given  Plan Discussed with: CRNA and Anesthesiologist  Anesthesia Plan Comments: (PAT note written 06/23/2020 by Myra Gianotti, PA-C. )       Anesthesia Quick Evaluation

## 2020-06-23 NOTE — Progress Notes (Addendum)
Anesthesia Chart Review: [name]   Case: [id number] Date/Time: 06/24/20 0815   Procedure: REPAIR STRABISMUS LEFT EYE (Left )   Anesthesia type: General   Pre-op diagnosis: EXOTROPIA   Location: [facility] OR ROOM 08 / [place] OR   Surgeons: Lamonte Sakai, MD      DISCUSSION: Patient is an 87 year old female scheduled for the above procedure.  History includes never smoker, CAD (s/p CABG: LIMA-LAD, SVG-OM, SVG-PDA 06/19/05), HTN, PAD, carotid artery disease (s/p right carotid endarterectomy 08/25/12, s/p right ICA stent 04/22/13 for restenosis), hypercholesterolemia, chronic cough.   Last cardiology visit with Dr. Terri Skains on 11/25/19. Given time since CABG (2007) and stress test (2014), he may consider routine stress test in the future, but at that time patient was able to do her activities of daily living and is functional without having any effort related symptoms of chest pain or shortness of breath. EKG without ischemic changes. Echo in 2020 showed normal LVEF, severe basal septal hypertrophy, normal RV systolic function, mild TR. Six month office follow-up planned (scheduled for 07/07/20). She denied SOB, cough, fever, chest pain per PAT RN phone interview.   06/22/20 presurgical COVID-19 test negative. She is a same day work-up, so she is for labs and anesthesia team evaluation on the day of surgery.    VS: Ht 5\' 1"  (1.549 m)   Wt 65.8 kg   BMI 27.40 kg/m   BP Readings from Last 3 Encounters:  11/25/19 (!) 158/63  05/20/19 126/62  11/13/18 120/66   Pulse Readings from Last 3 Encounters:  11/25/19 71  05/20/19 73  11/13/18 61    PROVIDERS: [name], MD is listed as PCP, but per PAT RN notes, PCP is Janie Morning, DO Rex Kras, Do is cardiologist Ruta Hinds, MD is vascular surgeon   LABS: For day of surgery.     EKG: 11/25/2019: Sinus  Rhythm, 74bpm, normal axis, without underlying ischemia or injury.    CV: Echo 03/22/18: IMPRESSIONS   1. The left ventricle has normal  systolic function, with an ejection  fraction of 55-60%. The cavity size was normal. Severe Basal Septal  hypertrophy. Left ventricular diastolic Doppler parameters are  indeterminate due to nondiagnostic images.  2. The right ventricle has normal systolic function. The cavity was  normal. There is no increase in right ventricular wall thickness.  3. The mitral valve is normal in structure. There is mild mitral annular  calcification present.  4. The tricuspid valve is normal in structure.  5. The aortic valve is tricuspid Mild sclerosis of the aortic valve.  6. The pulmonic valve was normal in structure.  7. Right atrial pressure is estimated at 3 mmHg.  8. The interatrial septum appears to be lipomatous.  9. When compared to the prior study: No prior Echo to compare.    Carotid US 08/15/17: Final Interpretation:  - Right Carotid: There is no evidence of stenosis in the right ICA. Patent stent without evidence of restenosis.  - Left Carotid: Velocities in the left ICA are consistent with a 1-39% stenosis. The ECA appears >50% stenosed.  - Vertebrals: Bilateral vertebral arteries demonstrate antegrade flow.  - Subclavians: Normal flow hemodynamics were seen in bilateral subclavian arteries.    Nuclear stress test [facility]) 08/18/12: Normal isotope uptake at both rest and stress.  No evidence of ischemia or scar.  Normal wall motion and endocardial thickening.  LVEF estimated at 88%.    Past Medical History:  Diagnosis Date  . Arthritis   .  Chronic cough    x 40 yrs - unknown orgin- checked out by Dr Velora Heckler  . Coronary artery disease   . Diverticulitis   . Hypercholesteremia   . Hypertension   . Internal carotid artery stenosis 08/2012  . Normal cardiac stress test    @ Dr Irven Shelling office  . Peripheral vascular disease [facility])     Past Surgical History:  Procedure Laterality Date  . [place] VITRECTOMY WITH 20 GAUGE MVR PORT FOR MACULAR HOLE  Left 10/07/2018   Procedure: 25 GAUGE PARS PLANA VITRECTOMY WITH 20 GAUGE MVR PORT;  Surgeon: [name], MD;  Location: [place];  Service: Ophthalmology;  Laterality: Left;  . ABDOMINAL HYSTERECTOMY    . APPENDECTOMY    . CARDIAC CATHETERIZATION  2007  . CAROTID ANGIOGRAM N/A 04/01/2013   Procedure: CAROTID ANGIOGRAM;  Surgeon: Elam Dutch, MD;  Location: [facility] CATH LAB;  Service: Cardiovascular;  Laterality: N/A;  . CAROTID ENDARTERECTOMY Right August 25, 2012   cea  . CAROTID STENT INSERTION Right 04/22/2013   Procedure: CAROTID STENT INSERTION;  Surgeon: Elam Dutch, MD;  Location: [facility] CATH LAB;  Service: Cardiovascular;  Laterality: Right;  . COLONOSCOPY    . CORONARY ARTERY BYPASS GRAFT    . ENDARTERECTOMY Right 08/25/2012   Procedure: ENDARTERECTOMY CAROTID;  Surgeon: Elam Dutch, MD;  Location: [place];  Service: Vascular;  Laterality: Right;  . EYE SURGERY Bilateral    both cataracts  . FRACTURE SURGERY Left Jun 14, 2011   [place]  . GAS INSERTION Left 10/07/2018   Procedure: GAS INJECTION LEFT EYE;  Surgeon: [name], MD;  Location: [place];  Service: Ophthalmology;  Laterality: Left;  . MEMBRANE PEEL Left 10/07/2018   Procedure: INTERNAL LIMITING MEMBRANE PEEL;  Surgeon: [name], MD;  Location: [place];  Service: Ophthalmology;  Laterality: Left;  . ORIF ANKLE FRACTURE  06/14/2011   Procedure: OPEN REDUCTION INTERNAL FIXATION (ORIF) ANKLE FRACTURE;  Surgeon: Ninetta Lights, MD;  Location: [place];  Service: Orthopedics;  Laterality: Left;  left ankle fracture open treatment trimalleolar ankle includes internal fixation WITHOUT fixation of posterior lip  . PHOTOCOAGULATION WITH LASER Left 10/07/2018   Procedure: PHOTOCOAGULATION WITH LASER;  Surgeon: [name], MD;  Location: [place];  Service: Ophthalmology;  Laterality: Left;  . SERUM PATCH Left 10/07/2018   Procedure: LEFT EYE SERUM PATCH FOR MACULAR HOLE;  Surgeon: [name], MD;   Location: [place];  Service: Ophthalmology;  Laterality: Left;  . SHOULDER ARTHROSCOPY WITH SUBACROMIAL DECOMPRESSION, ROTATOR CUFF REPAIR AND BICEP TENDON REPAIR Left 06/05/2012   Procedure: LEFT SHOULDER ARTHROSCOPY WITH SUBACROMIAL DECOMPRESSION, PARTIAL ACROMIOPLASTY WITH CORACOACROMIAL RELEASE, DISTAL CLAVICULECTOMY, WITH ROTATOR CUFF REPAIR AND BICEP TENODESIS;  Surgeon: Ninetta Lights, MD;  Location: [place];  Service: Orthopedics;  Laterality: Left;    MEDICATIONS: No current facility-administered medications for this encounter.   Marland Kitchen acetaminophen (TYLENOL) 500 MG tablet  . aspirin 81 MG tablet  . calcium carbonate (OSCAL) 1500 (600 Ca) MG TABS tablet  . Cholecalciferol (VITAMIN D) 50 MCG (2000 UT) tablet  . denosumab (PROLIA) 60 MG/ML SOSY injection  . ezetimibe (ZETIA) 10 MG tablet  . losartan-hydrochlorothiazide (HYZAAR) 50-12.5 MG tablet  . metoprolol tartrate (LOPRESSOR) 25 MG tablet  . Multiple Vitamins-Minerals (PRESERVISION AREDS 2) CAPS  . rosuvastatin (CRESTOR) 20 MG tablet    Myra Gianotti, PA-C Surgical Short Stay/Anesthesiology [facility] Phone [phone] [facility] Phone [phone]  06/23/2020 10:42 AM

## 2020-06-24 ENCOUNTER — Ambulatory Visit (HOSPITAL_COMMUNITY): Payer: Medicare Other | Admitting: Vascular Surgery

## 2020-06-24 ENCOUNTER — Encounter (HOSPITAL_COMMUNITY): Payer: Self-pay | Admitting: Ophthalmology

## 2020-06-24 ENCOUNTER — Ambulatory Visit (HOSPITAL_COMMUNITY)
Admission: RE | Admit: 2020-06-24 | Discharge: 2020-06-24 | Disposition: A | Discharge status: Home / Self Care | Payer: Medicare Other | Attending: Ophthalmology | Admitting: Ophthalmology

## 2020-06-24 ENCOUNTER — Encounter (HOSPITAL_COMMUNITY)
Admission: RE | Disposition: A | Discharge status: Home / Self Care | Payer: Self-pay | Source: Home / Self Care | Attending: Ophthalmology

## 2020-06-24 DIAGNOSIS — H50112 Monocular exotropia, left eye: Secondary | ICD-10-CM | POA: Diagnosis not present

## 2020-06-24 DIAGNOSIS — Z833 Family history of diabetes mellitus: Secondary | ICD-10-CM | POA: Insufficient documentation

## 2020-06-24 DIAGNOSIS — Z8349 Family history of other endocrine, nutritional and metabolic diseases: Secondary | ICD-10-CM | POA: Insufficient documentation

## 2020-06-24 DIAGNOSIS — I10 Essential (primary) hypertension: Secondary | ICD-10-CM | POA: Diagnosis not present

## 2020-06-24 DIAGNOSIS — H5010 Unspecified exotropia: Secondary | ICD-10-CM | POA: Diagnosis not present

## 2020-06-24 DIAGNOSIS — E7800 Pure hypercholesterolemia, unspecified: Secondary | ICD-10-CM | POA: Diagnosis not present

## 2020-06-24 DIAGNOSIS — Z8249 Family history of ischemic heart disease and other diseases of the circulatory system: Secondary | ICD-10-CM | POA: Diagnosis not present

## 2020-06-24 DIAGNOSIS — J9601 Acute respiratory failure with hypoxia: Secondary | ICD-10-CM | POA: Diagnosis not present

## 2020-06-24 DIAGNOSIS — Z809 Family history of malignant neoplasm, unspecified: Secondary | ICD-10-CM | POA: Diagnosis not present

## 2020-06-24 DIAGNOSIS — H53002 Unspecified amblyopia, left eye: Secondary | ICD-10-CM | POA: Diagnosis not present

## 2020-06-24 HISTORY — PX: STRABISMUS SURGERY: SHX218

## 2020-06-24 LAB — BASIC METABOLIC PANEL
Anion gap: 6 (ref 5–15)
BUN: 15 mg/dL (ref 8–23)
CO2: 30 mmol/L (ref 22–32)
Calcium: 9.7 mg/dL (ref 8.9–10.3)
Chloride: 99 mmol/L (ref 98–111)
Creatinine, Ser: 0.87 mg/dL (ref 0.44–1.00)
GFR, Estimated: >60 mL/min (ref >60)
Glucose, Bld: 94 mg/dL (ref 70–99)
Potassium: 3.3 mmol/L — ABNORMAL LOW (ref 3.5–5.1)
Sodium: 135 mmol/L (ref 135–145)

## 2020-06-24 SURGERY — STRABISMUS SURGERY, BILATERAL
Anesthesia: General | Site: Eye | Laterality: Left

## 2020-06-24 MED ORDER — FENTANYL CITRATE (PF) 100 MCG/2ML IJ SOLN: 25.0000 ug | INTRAMUSCULAR | Status: DC | PRN

## 2020-06-24 MED ORDER — BSS IO SOLN
INTRAOCULAR | Status: DC | PRN
  Administered 2020-06-24: 15 mL

## 2020-06-24 MED ORDER — TOBRAMYCIN-DEXAMETHASONE 0.3-0.1 % OP SUSP: 1.0000 [drp] | Freq: Four times a day (QID) | OPHTHALMIC | 0 refills | Status: DC

## 2020-06-24 MED ORDER — SUCCINYLCHOLINE CHLORIDE 20 MG/ML IJ SOLN
INTRAMUSCULAR | Status: DC | PRN
  Administered 2020-06-24: 160 mg via INTRAVENOUS

## 2020-06-24 MED ORDER — BUPIVACAINE HCL (PF) 0.75 % IJ SOLN
INTRAMUSCULAR | Status: AC
  Filled 2020-06-24: qty 10

## 2020-06-24 MED ORDER — KETOROLAC TROMETHAMINE 30 MG/ML IJ SOLN
INTRAMUSCULAR | Status: DC | PRN
  Administered 2020-06-24: 30 mg via INTRAVENOUS

## 2020-06-24 MED ORDER — METOPROLOL TARTRATE 12.5 MG HALF TABLET: 25.0000 mg | ORAL_TABLET | Freq: Once | ORAL | Status: AC

## 2020-06-24 MED ORDER — PHENYLEPHRINE HCL 2.5 % OP SOLN
OPHTHALMIC | Status: AC
  Filled 2020-06-24: qty 2

## 2020-06-24 MED ORDER — BUPIVACAINE HCL (PF) 0.75 % IJ SOLN
INTRAMUSCULAR | Status: DC | PRN
  Administered 2020-06-24: 1.5 mL

## 2020-06-24 MED ORDER — LACTATED RINGERS IV SOLN: INTRAVENOUS | Status: DC | PRN

## 2020-06-24 MED ORDER — TOBRAMYCIN-DEXAMETHASONE 0.3-0.1 % OP SUSP
OPHTHALMIC | Status: DC | PRN
  Administered 2020-06-24: 1 [drp] via OPHTHALMIC

## 2020-06-24 MED ORDER — SODIUM CHLORIDE 0.9 % IV SOLN: INTRAVENOUS | Status: DC

## 2020-06-24 MED ORDER — PHENYLEPHRINE 40 MCG/ML (10ML) SYRINGE FOR IV PUSH (FOR BLOOD PRESSURE SUPPORT)
PREFILLED_SYRINGE | INTRAVENOUS | Status: AC
  Filled 2020-06-24: qty 10

## 2020-06-24 MED ORDER — EPHEDRINE 5 MG/ML INJ
INTRAVENOUS | Status: AC
  Filled 2020-06-24: qty 10

## 2020-06-24 MED ORDER — FENTANYL CITRATE (PF) 250 MCG/5ML IJ SOLN
INTRAMUSCULAR | Status: DC | PRN
  Administered 2020-06-24 (×2): 50 ug via INTRAVENOUS

## 2020-06-24 MED ORDER — ARMC OPHTHALMIC DILATING DROPS: 1.0000 "application " | Freq: Once | OPHTHALMIC | Status: DC

## 2020-06-24 MED ORDER — LIDOCAINE 2% (20 MG/ML) 5 ML SYRINGE
INTRAMUSCULAR | Status: DC | PRN
  Administered 2020-06-24: 80 mg via INTRAVENOUS

## 2020-06-24 MED ORDER — NEOMYCIN-POLYMYXIN-DEXAMETH 3.5-10000-0.1 OP OINT
TOPICAL_OINTMENT | OPHTHALMIC | Status: AC
  Filled 2020-06-24: qty 3.5

## 2020-06-24 MED ORDER — CHLORHEXIDINE GLUCONATE 0.12 % MT SOLN
15.0000 mL | Freq: Once | OROMUCOSAL | Status: AC
  Administered 2020-06-24: 15 mL via OROMUCOSAL
  Filled 2020-06-24: qty 15

## 2020-06-24 MED ORDER — ONDANSETRON HCL 4 MG/2ML IJ SOLN
INTRAMUSCULAR | Status: AC
  Filled 2020-06-24: qty 2

## 2020-06-24 MED ORDER — PHENYLEPHRINE 40 MCG/ML (10ML) SYRINGE FOR IV PUSH (FOR BLOOD PRESSURE SUPPORT)
PREFILLED_SYRINGE | INTRAVENOUS | Status: DC | PRN
  Administered 2020-06-24 (×3): 80 ug via INTRAVENOUS

## 2020-06-24 MED ORDER — EPHEDRINE SULFATE-NACL 50-0.9 MG/10ML-% IV SOSY
PREFILLED_SYRINGE | INTRAVENOUS | Status: DC | PRN
  Administered 2020-06-24: 15 mg via INTRAVENOUS
  Administered 2020-06-24: 10 mg via INTRAVENOUS

## 2020-06-24 MED ORDER — TOBRAMYCIN-DEXAMETHASONE 0.3-0.1 % OP SUSP
OPHTHALMIC | Status: AC
  Filled 2020-06-24: qty 2.5

## 2020-06-24 MED ORDER — PROPOFOL 10 MG/ML IV BOLUS
INTRAVENOUS | Status: DC | PRN
  Administered 2020-06-24: 120 mg via INTRAVENOUS

## 2020-06-24 MED ORDER — PROPOFOL 10 MG/ML IV BOLUS
INTRAVENOUS | Status: AC
  Filled 2020-06-24: qty 20

## 2020-06-24 MED ORDER — NEOMYCIN-POLYMYXIN-DEXAMETH 3.5-10000-0.1 OP OINT
TOPICAL_OINTMENT | OPHTHALMIC | Status: DC | PRN
  Administered 2020-06-24: 1 via OPHTHALMIC

## 2020-06-24 MED ORDER — LACTATED RINGERS IV SOLN: INTRAVENOUS | Status: DC

## 2020-06-24 MED ORDER — ACETAMINOPHEN 500 MG PO TABS
1000.0000 mg | ORAL_TABLET | Freq: Once | ORAL | Status: AC
  Administered 2020-06-24: 1000 mg via ORAL
  Filled 2020-06-24: qty 2

## 2020-06-24 MED ORDER — BSS IO SOLN
INTRAOCULAR | Status: AC
  Filled 2020-06-24: qty 15

## 2020-06-24 MED ORDER — DROPERIDOL 2.5 MG/ML IJ SOLN: 0.6250 mg | Freq: Once | INTRAMUSCULAR | Status: DC | PRN

## 2020-06-24 MED ORDER — ONDANSETRON HCL 4 MG/2ML IJ SOLN
INTRAMUSCULAR | Status: DC | PRN
  Administered 2020-06-24: 4 mg via INTRAVENOUS

## 2020-06-24 MED ORDER — FENTANYL CITRATE (PF) 250 MCG/5ML IJ SOLN
INTRAMUSCULAR | Status: AC
  Filled 2020-06-24: qty 5

## 2020-06-24 MED ORDER — KETOROLAC TROMETHAMINE 30 MG/ML IJ SOLN
INTRAMUSCULAR | Status: AC
  Filled 2020-06-24: qty 1

## 2020-06-24 MED ORDER — METOPROLOL TARTRATE 12.5 MG HALF TABLET
ORAL_TABLET | ORAL | Status: AC
  Administered 2020-06-24: 25 mg via ORAL
  Filled 2020-06-24: qty 2

## 2020-06-24 MED ORDER — ORAL CARE MOUTH RINSE: 15.0000 mL | Freq: Once | OROMUCOSAL | Status: AC

## 2020-06-24 SURGICAL SUPPLY — 26 items
APL SRG 3 HI ABS STRL LF PLS (MISCELLANEOUS) ×1
APPLICATOR DR MATTHEWS STRL (MISCELLANEOUS) ×2 IMPLANT
BNDG EYE OVAL (GAUZE/BANDAGES/DRESSINGS) IMPLANT
CAUTERY EYE LOW TEMP 1300F FIN (OPHTHALMIC RELATED) IMPLANT
CORD BIPOLAR FORCEPS 12FT (ELECTRODE) ×2 IMPLANT
COVER BACK TABLE 60X90IN (DRAPES) ×2 IMPLANT
COVER MAYO STAND STRL (DRAPES) ×2 IMPLANT
COVER SURGICAL LIGHT HANDLE (MISCELLANEOUS) ×2 IMPLANT
COVER WAND RF STERILE (DRAPES) ×2 IMPLANT
DRAPE EENT ADH APERT 15X15 STR (DRAPES) IMPLANT
DRAPE ORTHO SPLIT 77X108 STRL (DRAPES) ×2
DRAPE SURG 17X23 STRL (DRAPES) ×2 IMPLANT
DRAPE SURG ORHT 6 SPLT 77X108 (DRAPES) ×1 IMPLANT
GLOVE BIO SURGEON STRL SZ7 (GLOVE) ×2 IMPLANT
GOWN STRL REUS W/ TWL LRG LVL3 (GOWN DISPOSABLE) ×2 IMPLANT
GOWN STRL REUS W/TWL LRG LVL3 (GOWN DISPOSABLE) ×4
NS IRRIG 1000ML POUR BTL (IV SOLUTION) ×2 IMPLANT
SHIELD EYE PEDIATRIC STRL (MISCELLANEOUS) ×4 IMPLANT
SPEAR EYE SURG WECK-CEL (MISCELLANEOUS) ×2 IMPLANT
STRIP CLOSURE SKIN 1/4X4 (GAUZE/BANDAGES/DRESSINGS) IMPLANT
SUT CHROMIC 7 0 TG140 8 (SUTURE) ×2 IMPLANT
SUT SILK 4 0 RB 1 (SUTURE) IMPLANT
SUT VICRYL 6 0 S 28 (SUTURE) ×4 IMPLANT
SYR 10ML LL (SYRINGE) ×2 IMPLANT
SYR 3ML LL SCALE MARK (SYRINGE) ×2 IMPLANT
TOWEL GREEN STERILE FF (TOWEL DISPOSABLE) ×2 IMPLANT

## 2020-06-24 NOTE — Op Note (Signed)
06/24/2020  9:41 AM  PATIENT:  [place] D[place] Unk Lightning  87 y.o. female  PRE-OPERATIVE DIAGNOSIS:  Exotropia left eye  POST-OPERATIVE DIAGNOSIS:  Exotropia left eye  PROCEDURE:  Lateral rectus muscle recession 17mm left; medial rectus plication 51mm left eye  SURGEON:  Annita Brod, M.D.   ANESTHESIA: General LMA and local subTenons bupivicaine  COMPLICATIONS: None immediate  DESCRIPTION OF PROCEDURE: The patient was taken to the operating room where She was identified by me. General anesthesia was induced without difficulty after placement of appropriate monitors. The patient was prepped and draped in the usual sterile ophthalmic fashion over the left eye. Maxitrol ointment was placed on the cornea for protection through the case.  A lid speculum was placed in the left eye. Forced ductions were unremarkable. Through an inferotemporal fornix incision through conjunctiva and Tenon's fascia, the left lateral rectus muscle was engaged on a series of muscle hooks and cleared of its fascial attachments. The tendon was secured with a double-armed 6-0 Vicryl suture with a double locking bite at each border of the muscle, 1 mm from the insertion. The muscle was disinserted, and was reattached to sclera at a measured distance of 6 millimeters posterior to the original insertion, using direct scleral passes in crossed swords fashion.  The suture ends were tied securely after the position of the muscle had been checked and found to be accurate.Conjunctiva was closed with 3 7-0 Chromic sutures.  Through an inferonasal fornix incision through conjunctiva and Tenon's fascia, the left medial rectus muscle was found at its expected location 5.54mm posterior to the medial limbus. The muscle was engaged on a series of muscle hooks and cleared of scar and fascial attachments. The muscle was secured with a double-armed 6-0 Vicryl suture with a double locking bite at each border of the muscle 93mm posterior to  the insertion; sutures were brought forward and passed superior to the muscle, through the lateral portion of insertion and exiting the medial portion of the insertion, and back through the medial aspect of the muscle 45mm posterior to the insertion with special care taken to prevent severing of the posterior suture loop. Gentle traction was placed on the sutures with concurrent posterior motion to the muscle, resulting in a muscle loop posterior to the suture and a 64mm shortening of the medial rectus muscle. The suture ends were securely tied and cut.  1.102mL of bupivacaine 0.5% was diffused into the inferolateral sub-Tenons space for perioperative anesthesia through the incision.  Medial conjunctiva was closed with 2 7-0 Chromic sutures.  Tobradex eye drops were placed in the left eye. The patient was awakened without difficulty and taken to the recovery room in stable condition, having suffered no intraoperative or immediate postoperative complications.  Maryelizabeth Kaufmann.D.

## 2020-06-24 NOTE — Anesthesia Procedure Notes (Signed)
Procedure Name: Intubation Date/Time: 06/24/2020 9:02 AM Performed by: Kathryne Hitch, CRNA Pre-anesthesia Checklist: Patient identified, Emergency Drugs available, Suction available and Patient being monitored Patient Re-evaluated:Patient Re-evaluated prior to induction Oxygen Delivery Method: Circle system utilized Preoxygenation: Pre-oxygenation with 100% oxygen Induction Type: IV induction Ventilation: Mask ventilation without difficulty Laryngoscope Size: Miller and 2 Grade View: Grade I Tube type: Oral Tube size: 7.0 mm Number of attempts: 1 Airway Equipment and Method: Stylet and Oral airway Placement Confirmation: ETT inserted through vocal cords under direct vision,  positive ETCO2 and breath sounds checked- equal and bilateral Secured at: 21 cm Tube secured with: Tape Dental Injury: Teeth and Oropharynx as per pre-operative assessment

## 2020-06-24 NOTE — Discharge Instructions (Signed)
Diet: Clear liquids, advance to soft foods then regular diet as tolerated.  Pain control:   1)  Ibuprofen 600 mg by mouth every 6-8 hours as needed for pain  2)  Acetaminophen 325 one or two by mouth every 4-6 hours as needed for pain that is not resolved by ibuprofen; may alternate with ibuprofen every 2-3 hours for best results. DO NOT TAKE acetaminophen or ibuprofen if already on this medication in any form.  This regimen has been clinically studied and proven as effective against pain as morphine.  Eye medications:  Antibiotic eye drops or ointment, one drop or application in the operated eye(s) 4 times a day for 10 days.    Activity: No swimming for 1 week. It is OK to let water run over the face and eyes while showering or taking a bath, even during the first week.  No other restriction on exercise or activity.  Eye movement: The eyes may look very slightly crossed in or turned out, and you may experience temporary diplopia. This is not unusual postoperatively and may happen up to two months after surgery while the muscles are healing. The eyes may be tired during the first few weeks after surgery; reading can be uncomfortable during the healing process but will not hurt the eyes.  Call Dr. Serita Grit office [phone] with any problems or concerns.

## 2020-06-24 NOTE — Transfer of Care (Signed)
Immediate Anesthesia Transfer of Care Note  Patient: [name]  Procedure(s) Performed: REPAIR STRABISMUS LEFT EYE (Left Eye)  Patient Location: PACU  Anesthesia Type:General  Level of Consciousness: drowsy and patient cooperative  Airway & Oxygen Therapy: Patient Spontanous Breathing  Post-op Assessment: Report given to RN and Post -op Vital signs reviewed and stable  Post vital signs: Reviewed and stable  Last Vitals:  Vitals Value Taken Time  BP 145/60 06/24/20 0955  Temp 36.6 C 06/24/20 0955  Pulse 74 06/24/20 0955  Resp 23 06/24/20 0955  SpO2 90 % 06/24/20 0955  Vitals shown include unvalidated device data.  Last Pain:  Vitals:   06/24/20 0723  PainSc: 0-No pain         Complications: No complications documented.

## 2020-06-24 NOTE — H&P (Signed)
Date of examination:  06/24/20  Indication for surgery: left exotropia  Pertinent past medical history:  Past Medical History:  Diagnosis Date  . Arthritis   . Chronic cough    x 40 yrs - unknown orgin- checked out by Dr Velora Heckler  . Coronary artery disease   . Diverticulitis   . Hypercholesteremia   . Hypertension   . Internal carotid artery stenosis 08/2012  . Normal cardiac stress test    @ Dr Irven Shelling office  . Peripheral vascular disease ([place])     Pertinent ocular history:  Longstanding left exotropia that has worsened since cataract surgery. Patient left amblyopia and is bothered that left eye wanders and she cannot make eye contact socially.  Pertinent family history:  Family History  Problem Relation Age of Onset  . Cancer Father        Brain   . Diabetes Sister   . Heart disease Sister        after age 77  . Hyperlipidemia Sister   . Heart disease Brother        After age 66  . Cancer Sister     General:  Healthy appearing patient in no distress.  Mild tremor.  Eyes:    Acuity OD 20/20  OS 20/80-  cc   External: [place] OU  Anterior segment: intraocular lens in place OU  Motility:   25pd LXT  Refraction:  Manifest  OD plano  OS -2.00+0.25x180     Impression:  87yo female with exotropia of the left eye in setting of amblyopia.   Plan: Strabismus surgery left eye  M. Lenox Ahr, MD

## 2020-06-24 NOTE — Anesthesia Postprocedure Evaluation (Signed)
Anesthesia Post Note  Patient: [name]  Procedure(s) Performed: REPAIR STRABISMUS LEFT EYE (Left Eye)     Patient location during evaluation: PACU Anesthesia Type: General Level of consciousness: awake and alert Pain management: pain level controlled Vital Signs Assessment: post-procedure vital signs reviewed and stable Respiratory status: spontaneous breathing, nonlabored ventilation, respiratory function stable and patient connected to nasal cannula oxygen Cardiovascular status: blood pressure returned to baseline and stable Postop Assessment: no apparent nausea or vomiting Anesthetic complications: no   No complications documented.  Last Vitals:  Vitals:   06/24/20 1015 06/24/20 1025  BP:  123/63  Pulse:  71  Resp:  (!) 25  Temp:  (!) 36.1 C  SpO2: 94% 92%    Last Pain:  Vitals:   06/24/20 1025  PainSc: 0-No pain                 Candra R Zaydon Kinser

## 2020-06-25 ENCOUNTER — Encounter (HOSPITAL_COMMUNITY): Payer: Self-pay | Admitting: Ophthalmology

## 2020-07-07 ENCOUNTER — Other Ambulatory Visit: Payer: Self-pay

## 2020-07-07 ENCOUNTER — Encounter: Payer: Self-pay | Admitting: Cardiology

## 2020-07-07 ENCOUNTER — Ambulatory Visit: Payer: Medicare Other | Admitting: Cardiology

## 2020-07-07 VITALS — BP 121/63 | HR 80 | Resp 16 | Ht 61.0 in | Wt 141.0 lb

## 2020-07-07 DIAGNOSIS — Z95828 Presence of other vascular implants and grafts: Secondary | ICD-10-CM

## 2020-07-07 DIAGNOSIS — I6523 Occlusion and stenosis of bilateral carotid arteries: Secondary | ICD-10-CM | POA: Diagnosis not present

## 2020-07-07 DIAGNOSIS — I2510 Atherosclerotic heart disease of native coronary artery without angina pectoris: Secondary | ICD-10-CM

## 2020-07-07 DIAGNOSIS — Z951 Presence of aortocoronary bypass graft: Secondary | ICD-10-CM | POA: Diagnosis not present

## 2020-07-07 DIAGNOSIS — E782 Mixed hyperlipidemia: Secondary | ICD-10-CM | POA: Diagnosis not present

## 2020-07-07 DIAGNOSIS — Z9889 Other specified postprocedural states: Secondary | ICD-10-CM

## 2020-07-07 DIAGNOSIS — I10 Essential (primary) hypertension: Secondary | ICD-10-CM

## 2020-07-07 NOTE — Progress Notes (Signed)
[place] Date of Birth: [birth year] MRN: [id number] Primary Care Provider:Kim, Jeneen Rinks, MD  Former cardiology providers: Jeri Lager, MSN, APRN, FNP-C  Primary cardiology:Chelcie Estorga [facility] (established care 05/20/2019)  Date: 07/07/20 Last Office Visit: 11/25/2019.  Chief Complaint  Patient presents with  . Follow-up    Known history of CAD status post CABG    HPI  [name] is a 87 y.o.  female who presents to the office with a chief complaint of " 75-monthfollow-up for CAD management." Patient's past medical history and cardiovascular risk factors include: Established coronary artery disease without angina pectoris, status post three-vessel CABG in 2007, mixed hyperlipidemia, carotid artery disease with history of right carotid endarterectomy and carotid stent, postmenopausal female, advanced age.  Patient presents today for a 667-monthollow-up for underlying management of CAD with a prior history of three-vessel CABG.  Since last office visit patient states that she is doing well from a cardiovascular standpoint.  No hospitalizations or urgent care visits for chest pain or shortness of breath at rest or with effort related activities.  Patient is compliant with her medical management.  She recently had eye surgery for which she is recovering from.  She follows up with Dr. FiOneida Alarn regards to the management of her carotid artery disease. Most recent carotid duplex reviewed with the patient.   Her functional status remains stable.  FUNCTIONAL STATUS: No structured exercise program or daily routine.  But she has 14 steps in her house that she goes up and down approximately 6-7 times per day without any effort related symptoms.  ALLERGIES: Allergies  Allergen Reactions  . Hydrocodone Shortness Of Breath, Itching and Rash    Rash-itching-tongue swelled  . Fenofibrate Other (See Comments)    Caused every joint to become stif  . Latex Rash  and Other (See Comments)    "Blisters in mouth"  . Oxycodone Itching, Swelling and Rash     MEDICATION LIST PRIOR TO VISIT: Current Outpatient Medications on File Prior to Visit  Medication Sig Dispense Refill  . acetaminophen (TYLENOL) 500 MG tablet Take 500 mg by mouth every 6 (six) hours as needed for moderate pain or headache.    . Marland Kitchenspirin 81 MG tablet Take 81 mg by mouth daily.    . calcium carbonate (OSCAL) 1500 (600 Ca) MG TABS tablet Take 600 mg of elemental calcium by mouth 2 (two) times daily with a meal.    . Cholecalciferol (VITAMIN D) 50 MCG (2000 UT) tablet Take 2,000 Units by mouth in the morning and at bedtime.    . Marland Kitchenenosumab (PROLIA) 60 MG/ML SOSY injection Inject 60 mg into the skin every 6 (six) months.     . ezetimibe (ZETIA) 10 MG tablet Take 10 mg by mouth every other day. Alternates zetia one day and crestor the next    . losartan-hydrochlorothiazide (HYZAAR) 50-12.5 MG tablet Take 1 tablet by mouth daily.    . metoprolol tartrate (LOPRESSOR) 25 MG tablet Take 1 tablet (25 mg total) by mouth 2 (two) times daily.    . rosuvastatin (CRESTOR) 20 MG tablet Take 20 mg by mouth every other day. Alternates zetia one day and crestor the next    . tobramycin-dexamethasone (TOBRADEX) ophthalmic solution Place 1 drop into the left eye 4 (four) times daily. For one week 5 mL 0   No current facility-administered medications on file prior to visit.    PAST MEDICAL HISTORY: Past Medical History:  Diagnosis Date  . Arthritis   .  Chronic cough    x 40 yrs - unknown orgin- checked out by Dr Velora Heckler  . Coronary artery disease   . Diverticulitis   . Hypercholesteremia   . Hypertension   . Internal carotid artery stenosis 08/2012  . Normal cardiac stress test    @ Dr Irven Shelling office  . Peripheral vascular disease ([place])     PAST SURGICAL HISTORY: Past Surgical History:  Procedure Laterality Date  . [place] VITRECTOMY WITH 20 GAUGE MVR PORT FOR MACULAR HOLE Left  10/07/2018   Procedure: 25 GAUGE PARS PLANA VITRECTOMY WITH 20 GAUGE MVR PORT;  Surgeon: [name], MD;  Location: [place];  Service: Ophthalmology;  Laterality: Left;  . ABDOMINAL HYSTERECTOMY    . APPENDECTOMY    . CARDIAC CATHETERIZATION  2007  . CAROTID ANGIOGRAM N/A 04/01/2013   Procedure: CAROTID ANGIOGRAM;  Surgeon: Elam Dutch, MD;  [facility] C[facility] LAB;  Service: Cardiovascular;  Laterality: N/A;  . CAROTID ENDARTERECTOMY Right August 25, 2012   cea  . CAROTID STENT INSERTION Right 04/22/2013   Procedure: CAROTID STENT INSERTION;  Surgeon: Elam Dutch, MD;  Location: [facility] CATH LAB;  Service: Cardiovascular;  Laterality: Right;  . COLONOSCOPY    . CORONARY ARTERY BYPASS GRAFT    . ENDARTERECTOMY Right 08/25/2012   Procedure: ENDARTERECTOMY CAROTID;  Surgeon: Elam Dutch, MD;  Location: [place];  Service: Vascular;  Laterality: Right;  . EYE SURGERY Bilateral    both cataracts  . FRACTURE SURGERY Left Jun 14, 2011   [place]  . GAS INSERTION Left 10/07/2018   Procedure: GAS INJECTION LEFT EYE;  Surgeon: [name], MD;  Location: [place];  Service: Ophthalmology;  Laterality: Left;  . MEMBRANE PEEL Left 10/07/2018   Procedure: INTERNAL LIMITING MEMBRANE PEEL;  Surgeon: [name], MD;  Location: [place];  Service: Ophthalmology;  Laterality: Left;  . ORIF ANKLE FRACTURE  06/14/2011   Procedure: OPEN REDUCTION INTERNAL FIXATION (ORIF) ANKLE FRACTURE;  Surgeon: Ninetta Lights, MD;  Location: [place];  Service: Orthopedics;  Laterality: Left;  left ankle fracture open treatment trimalleolar ankle includes internal fixation WITHOUT fixation of posterior lip  . PHOTOCOAGULATION WITH LASER Left 10/07/2018   Procedure: PHOTOCOAGULATION WITH LASER;  Surgeon: [name], MD;  Location: [place];  Service: Ophthalmology;  Laterality: Left;  . SERUM PATCH Left 10/07/2018   Procedure: LEFT EYE SERUM PATCH FOR MACULAR HOLE;  Surgeon: [name], MD;  Location: [place];  Service: Ophthalmology;  Laterality: Left;  . SHOULDER ARTHROSCOPY WITH SUBACROMIAL DECOMPRESSION, ROTATOR CUFF REPAIR AND BICEP TENDON REPAIR Left 06/05/2012   Procedure: LEFT SHOULDER ARTHROSCOPY WITH SUBACROMIAL DECOMPRESSION, PARTIAL ACROMIOPLASTY WITH CORACOACROMIAL RELEASE, DISTAL CLAVICULECTOMY, WITH ROTATOR CUFF REPAIR AND BICEP TENODESIS;  Surgeon: Ninetta Lights, MD;  Location: [place];  Service: Orthopedics;  Laterality: Left;  . STRABISMUS SURGERY Left 06/24/2020   Procedure: REPAIR STRABISMUS LEFT EYE;  Surgeon: Lamonte Sakai, MD;  Location: [place];  Service: Ophthalmology;  Laterality: Left;    FAMILY HISTORY: The patient's family history includes Cancer in her father and sister; Diabetes in her sister; Heart disease in her brother and sister; Hyperlipidemia in her sister.   SOCIAL HISTORY:  The patient  reports that she has never smoked. She has never used smokeless tobacco. She reports current alcohol use of about 7.0 standard drinks of alcohol per week. She reports that she does not use drugs.  Review of Systems  Constitutional: Negative for chills and fever.  HENT: Negative for ear discharge, ear pain and nosebleeds.   Eyes: Negative for blurred vision and discharge.  Cardiovascular: Negative for chest pain, claudication, dyspnea on exertion, leg swelling, near-syncope, orthopnea, palpitations, paroxysmal nocturnal dyspnea and syncope.  Respiratory: Negative for cough and shortness of breath.   Endocrine: Negative for polydipsia, polyphagia and polyuria.  Hematologic/Lymphatic: Negative for bleeding problem.  Skin: Negative for flushing and nail changes.  Musculoskeletal: Negative for muscle cramps, muscle weakness and myalgias.  Gastrointestinal: Negative for abdominal pain, dysphagia, hematemesis, hematochezia, melena, nausea and vomiting.  Neurological: Negative for dizziness, focal weakness and light-headedness.    PHYSICAL EXAM: Vitals with BMI  07/07/2020 06/24/2020 06/24/2020  Height _0  - -  Weight 141 lbs - -  BMI 13.24 - -  Systolic 401 027 253  Diastolic 63 63 55  Pulse 80 71 70    CONSTITUTIONAL: Well-developed and well-nourished. No acute distress.  SKIN: Skin is warm and dry. No rash noted. No cyanosis. No pallor. No jaundice HEAD: Normocephalic and atraumatic.  EYES: No scleral icterus MOUTH/THROAT: Moist oral membranes.  NECK: No JVD present. No thyromegaly noted.  Right carotid endarterectomy site is clean dry and intact LYMPHATIC: No visible cervical adenopathy.  CHEST Normal respiratory effort. No intercostal retractions.  Sternotomy site is well-healed LUNGS: Clear to auscultation bilaterally.  No stridor. No wheezes. No rales.  CARDIOVASCULAR: Regular rate and rhythm, positive S1-S2, no murmurs rubs or gallops appreciated. ABDOMINAL: No apparent ascites.  EXTREMITIES: No peripheral edema  HEMATOLOGIC: No significant bruising NEUROLOGIC: Oriented to person, place, and time. Nonfocal. Normal muscle tone.  PSYCHIATRIC: Normal mood and affect. Normal behavior. Cooperative  CARDIAC DATABASE: EKG: 07/07/2020: Normal sinus rhythm, 80 bpm, normal axis, without underlying ischemia or injury pattern.    Echocardiogram: 03/22/2018: LVEF 55-60%, severe basal septal hypertrophy mild MAC, aortic valve sclerosis, intra-atrial septum appears lipomatous.  Stress Testing:  08/18/2012: Myoview stress test reported normal isotope uptake in both rest and stress images.  No evidence of ischemia or scar.  LVEF by gated SPECT 88%.  LABORATORY DATA: CBC Latest Ref Rng & Units 10/07/2018 03/23/2018 03/22/2018  WBC 4.0 - 10.5 K/uL 5.1 6.0 6.2  Hemoglobin 12.0 - 15.0 g/dL 13.6 11.9(L) 12.1  Hematocrit 36.0 - 46.0 % 41.1 37.4 38.2  Platelets 150 - 400 K/uL 222 276 298    CMP Latest Ref Rng & Units 06/24/2020 10/07/2018 03/23/2018  Glucose 70 - 99 mg/dL 94 103(H) 84  BUN 8 - 23 mg/dL _1 Creatinine 0.44 - 1.00 mg/dL 0.87 0.77 0.79   Sodium 135 - 145 mmol/L 135 137 136  Potassium 3.5 - 5.1 mmol/L 3.3(L) 3.8 4.4  Chloride 98 - 111 mmol/L 99 98 100  CO2 22 - 32 mmol/L 30 27 33(H)  Calcium 8.9 - 10.3 mg/dL 9.7 9.2 8.6(L)  Total Protein 6.5 - 8.1 g/dL - - -  Total Bilirubin 0.3 - 1.2 mg/dL - - -  Alkaline Phos 38 - 126 U/L - - -  AST 15 - 41 U/L - - -  ALT 0 - 44 U/L - - -    FINAL MEDICATION LIST END OF ENCOUNTER: No orders of the defined types were placed in this encounter.   Medications Discontinued During This Encounter  Medication Reason  . Multiple Vitamins-Minerals (PRESERVISION AREDS 2) CAPS Error     Current Outpatient Medications:  .  acetaminophen (TYLENOL) 500 MG tablet, Take 500 mg by mouth every 6 (six)  hours as needed for moderate pain or headache., Disp: , Rfl:  .  aspirin 81 MG tablet, Take 81 mg by mouth daily., Disp: , Rfl:  .  calcium carbonate (OSCAL) 1500 (600 Ca) MG TABS tablet, Take 600 mg of elemental calcium by mouth 2 (two) times daily with a meal., Disp: , Rfl:  .  Cholecalciferol (VITAMIN D) 50 MCG (2000 UT) tablet, Take 2,000 Units by mouth in the morning and at bedtime., Disp: , Rfl:  .  denosumab (PROLIA) 60 MG/ML SOSY injection, Inject 60 mg into the skin every 6 (six) months. , Disp: , Rfl:  .  ezetimibe (ZETIA) 10 MG tablet, Take 10 mg by mouth every other day. Alternates zetia one day and crestor the next, Disp: , Rfl:  .  losartan-hydrochlorothiazide (HYZAAR) 50-12.5 MG tablet, Take 1 tablet by mouth daily., Disp: , Rfl:  .  metoprolol tartrate (LOPRESSOR) 25 MG tablet, Take 1 tablet (25 mg total) by mouth 2 (two) times daily., Disp: , Rfl:  .  rosuvastatin (CRESTOR) 20 MG tablet, Take 20 mg by mouth every other day. Alternates zetia one day and crestor the next, Disp: , Rfl:  .  tobramycin-dexamethasone (TOBRADEX) ophthalmic solution, Place 1 drop into the left eye 4 (four) times daily. For one week, Disp: 5 mL, Rfl: 0  IMPRESSION:    ICD-10-CM   1. Coronary artery disease  involving native coronary artery of native heart without angina pectoris  I25.10 EKG 12-Lead    CANCELED: Lipid Panel With LDL/HDL Ratio    CANCELED: LDL cholesterol, direct    CANCELED: CMP14+EGFR    CANCELED: Magnesium  2. Hx of CABG  Z95.1 CANCELED: Lipid Panel With LDL/HDL Ratio    CANCELED: LDL cholesterol, direct  3. Essential hypertension  I10   4. Carotid stenosis, bilateral  I65.23   5. History of right-sided carotid endarterectomy  Z98.890   6. History of right carotid artery stent placement  Z98.890    Z95.828   7. Mixed hyperlipidemia  E78.2 CANCELED: Lipid Panel With LDL/HDL Ratio    CANCELED: LDL cholesterol, direct     RECOMMENDATIONS: [place] Daughtridge Kautzman is a 87 y.o. female whose past medical history and cardiovascular risk factors include: Established coronary artery disease without angina pectoris, status post three-vessel CABG in 2007, mixed hyperlipidemia, carotid artery disease with history of right carotid endarterectomy and carotid stent, postmenopausal female, advanced age.  Coronary artery disease without angina pectoris:  Stable asymptomatic since last visit.  Medications reconciled.  No use of sublingual nitroglycerin tablets.  EKG reviewed and noted above for further reference.  No hospitalizations since last office encounter.  Patient prefers to hold off on additional ischemic evaluation as she remains asymptomatic at this time.  However, she is aware that if she develops chest pain or shortness of breath we could consider this sooner than the next office visits.    Initially recommended checking blood work including a fasting lipid profile; however, patient states that she recently had labs done with her PCP which are no more than 2 months old.  We will try to obtain those records for review and reference.    History of CABG: See above  Carotid artery atherosclerosis: Patient follows up with Dr. Oneida Alar twice a year.  Continue to  follow.  Mixed hyperlipidemia: Continue statin therapy and Zetia.  Patient does not endorse any myalgias.  Currently managed by primary team.  Orders Placed This Encounter  Procedures  . EKG 12-Lead   --Continue cardiac  medications as reconciled in final medication list. --Return in about 6 months (around 01/06/2021) for Follow up, CAD, hx of CABG. Or sooner if needed. --Continue follow-up with your primary care physician regarding the management of your other chronic comorbid conditions.  Patient's questions and concerns were addressed to her satisfaction. She voices understanding of the instructions provided during this encounter.   This note was created using a voice recognition software as a result there may be grammatical errors inadvertently enclosed that do not reflect the nature of this encounter. Every attempt is made to correct such errors.  Rex Kras, [place], [facility]  Pager: [phone] Office: [phone]

## 2020-08-08 DIAGNOSIS — Z20822 Contact with and (suspected) exposure to covid-19: Secondary | ICD-10-CM | POA: Diagnosis not present

## 2020-09-08 DIAGNOSIS — E559 Vitamin D deficiency, unspecified: Secondary | ICD-10-CM | POA: Diagnosis not present

## 2020-09-08 DIAGNOSIS — E785 Hyperlipidemia, unspecified: Secondary | ICD-10-CM | POA: Diagnosis not present

## 2020-09-08 DIAGNOSIS — I10 Essential (primary) hypertension: Secondary | ICD-10-CM | POA: Diagnosis not present

## 2020-09-08 DIAGNOSIS — R739 Hyperglycemia, unspecified: Secondary | ICD-10-CM | POA: Diagnosis not present

## 2020-09-14 DIAGNOSIS — R053 Chronic cough: Secondary | ICD-10-CM | POA: Diagnosis not present

## 2020-09-14 DIAGNOSIS — E785 Hyperlipidemia, unspecified: Secondary | ICD-10-CM | POA: Diagnosis not present

## 2020-09-14 DIAGNOSIS — I6529 Occlusion and stenosis of unspecified carotid artery: Secondary | ICD-10-CM | POA: Diagnosis not present

## 2020-09-14 DIAGNOSIS — E559 Vitamin D deficiency, unspecified: Secondary | ICD-10-CM | POA: Diagnosis not present

## 2020-09-14 DIAGNOSIS — Z79899 Other long term (current) drug therapy: Secondary | ICD-10-CM | POA: Diagnosis not present

## 2020-09-14 DIAGNOSIS — M810 Age-related osteoporosis without current pathological fracture: Secondary | ICD-10-CM | POA: Diagnosis not present

## 2020-09-14 DIAGNOSIS — Z0000 Encounter for general adult medical examination without abnormal findings: Secondary | ICD-10-CM | POA: Diagnosis not present

## 2020-10-05 DIAGNOSIS — Z9889 Other specified postprocedural states: Secondary | ICD-10-CM | POA: Diagnosis not present

## 2020-10-05 DIAGNOSIS — H5052 Exophoria: Secondary | ICD-10-CM | POA: Diagnosis not present

## 2020-10-05 DIAGNOSIS — Z961 Presence of intraocular lens: Secondary | ICD-10-CM | POA: Diagnosis not present

## 2020-10-09 NOTE — Progress Notes (Signed)
External Labs: Collected: 09/08/2020 provided by primary care Hemoglobin 14.8 g/dL, hematocrit 43.4% Sodium 142, potassium 4.8, bicarb 33, creatinine 0.78, BUN 18 hemoglobin A1c 5.3 Total cholesterol 179, triglycerides 133, HDL 50, LDL 102, non-HDL 129

## 2020-10-26 DIAGNOSIS — Z961 Presence of intraocular lens: Secondary | ICD-10-CM | POA: Diagnosis not present

## 2020-10-26 DIAGNOSIS — Z9889 Other specified postprocedural states: Secondary | ICD-10-CM | POA: Diagnosis not present

## 2020-10-26 DIAGNOSIS — H35342 Macular cyst, hole, or pseudohole, left eye: Secondary | ICD-10-CM | POA: Diagnosis not present

## 2020-10-26 DIAGNOSIS — H43813 Vitreous degeneration, bilateral: Secondary | ICD-10-CM | POA: Diagnosis not present

## 2020-10-26 DIAGNOSIS — E559 Vitamin D deficiency, unspecified: Secondary | ICD-10-CM | POA: Diagnosis not present

## 2020-10-26 DIAGNOSIS — H40013 Open angle with borderline findings, low risk, bilateral: Secondary | ICD-10-CM | POA: Diagnosis not present

## 2020-10-26 DIAGNOSIS — M810 Age-related osteoporosis without current pathological fracture: Secondary | ICD-10-CM | POA: Diagnosis not present

## 2020-12-02 DIAGNOSIS — Z23 Encounter for immunization: Secondary | ICD-10-CM | POA: Diagnosis not present

## 2020-12-07 DIAGNOSIS — H35342 Macular cyst, hole, or pseudohole, left eye: Secondary | ICD-10-CM | POA: Diagnosis not present

## 2020-12-07 DIAGNOSIS — H43813 Vitreous degeneration, bilateral: Secondary | ICD-10-CM | POA: Diagnosis not present

## 2020-12-07 DIAGNOSIS — Z9889 Other specified postprocedural states: Secondary | ICD-10-CM | POA: Diagnosis not present

## 2020-12-07 DIAGNOSIS — H40013 Open angle with borderline findings, low risk, bilateral: Secondary | ICD-10-CM | POA: Diagnosis not present

## 2020-12-07 DIAGNOSIS — Z961 Presence of intraocular lens: Secondary | ICD-10-CM | POA: Diagnosis not present

## 2020-12-16 DIAGNOSIS — Z20828 Contact with and (suspected) exposure to other viral communicable diseases: Secondary | ICD-10-CM | POA: Diagnosis not present

## 2021-01-06 ENCOUNTER — Emergency Department (HOSPITAL_COMMUNITY)
Admission: EM | Admit: 2021-01-06 | Discharge: 2021-01-06 | Disposition: A | Discharge status: Home / Self Care | Payer: Medicare Other | Attending: Emergency Medicine | Admitting: Emergency Medicine

## 2021-01-06 ENCOUNTER — Ambulatory Visit
Admission: EM | Admit: 2021-01-06 | Discharge: 2021-01-06 | Disposition: A | Discharge status: Home / Self Care | Payer: Medicare Other

## 2021-01-06 ENCOUNTER — Other Ambulatory Visit: Payer: Self-pay

## 2021-01-06 ENCOUNTER — Emergency Department (HOSPITAL_COMMUNITY): Payer: Medicare Other

## 2021-01-06 ENCOUNTER — Encounter (HOSPITAL_COMMUNITY): Payer: Self-pay | Admitting: Emergency Medicine

## 2021-01-06 DIAGNOSIS — Z951 Presence of aortocoronary bypass graft: Secondary | ICD-10-CM | POA: Diagnosis not present

## 2021-01-06 DIAGNOSIS — Z91040 Latex allergy status: Secondary | ICD-10-CM | POA: Diagnosis not present

## 2021-01-06 DIAGNOSIS — R051 Acute cough: Secondary | ICD-10-CM

## 2021-01-06 DIAGNOSIS — I2510 Atherosclerotic heart disease of native coronary artery without angina pectoris: Secondary | ICD-10-CM | POA: Insufficient documentation

## 2021-01-06 DIAGNOSIS — Z79899 Other long term (current) drug therapy: Secondary | ICD-10-CM | POA: Diagnosis not present

## 2021-01-06 DIAGNOSIS — R531 Weakness: Secondary | ICD-10-CM | POA: Diagnosis not present

## 2021-01-06 DIAGNOSIS — R911 Solitary pulmonary nodule: Secondary | ICD-10-CM | POA: Diagnosis not present

## 2021-01-06 DIAGNOSIS — R0602 Shortness of breath: Secondary | ICD-10-CM | POA: Diagnosis not present

## 2021-01-06 DIAGNOSIS — Z20822 Contact with and (suspected) exposure to covid-19: Secondary | ICD-10-CM | POA: Insufficient documentation

## 2021-01-06 DIAGNOSIS — Z7982 Long term (current) use of aspirin: Secondary | ICD-10-CM | POA: Diagnosis not present

## 2021-01-06 DIAGNOSIS — I10 Essential (primary) hypertension: Secondary | ICD-10-CM | POA: Insufficient documentation

## 2021-01-06 DIAGNOSIS — R918 Other nonspecific abnormal finding of lung field: Secondary | ICD-10-CM | POA: Diagnosis not present

## 2021-01-06 DIAGNOSIS — R5383 Other fatigue: Secondary | ICD-10-CM

## 2021-01-06 DIAGNOSIS — R0902 Hypoxemia: Secondary | ICD-10-CM | POA: Insufficient documentation

## 2021-01-06 HISTORY — DX: Disorder of thyroid, unspecified: E07.9

## 2021-01-06 LAB — CBC WITH DIFFERENTIAL/PLATELET
Abs Immature Granulocytes: 0.01 10*3/uL (ref 0.00–0.07)
Basophils Absolute: 0 10*3/uL (ref 0.0–0.1)
Basophils Relative: 1 %
Eosinophils Absolute: 0.2 10*3/uL (ref 0.0–0.5)
Eosinophils Relative: 2 %
HCT: 43.9 % (ref 36.0–46.0)
Hemoglobin: 14.5 g/dL (ref 12.0–15.0)
Immature Granulocytes: 0 %
Lymphocytes Relative: 25 %
Lymphs Abs: 1.8 10*3/uL (ref 0.7–4.0)
MCH: 32.7 pg (ref 26.0–34.0)
MCHC: 33 g/dL (ref 30.0–36.0)
MCV: 99.1 fL (ref 80.0–100.0)
Monocytes Absolute: 0.9 10*3/uL (ref 0.1–1.0)
Monocytes Relative: 13 %
Neutro Abs: 4.1 10*3/uL (ref 1.7–7.7)
Neutrophils Relative %: 59 %
Platelets: 356 10*3/uL (ref 150–400)
RBC: 4.43 MIL/uL (ref 3.87–5.11)
RDW: 12.1 % (ref 11.5–15.5)
WBC: 7 10*3/uL (ref 4.0–10.5)
nRBC: 0 % (ref 0.0–0.2)

## 2021-01-06 LAB — COMPREHENSIVE METABOLIC PANEL
ALT: 62 U/L — ABNORMAL HIGH (ref 0–44)
AST: 59 U/L — ABNORMAL HIGH (ref 15–41)
Albumin: 3.4 g/dL — ABNORMAL LOW (ref 3.5–5.0)
Alkaline Phosphatase: 61 U/L (ref 38–126)
Anion gap: 10 (ref 5–15)
BUN: 12 mg/dL (ref 8–23)
CO2: 30 mmol/L (ref 22–32)
Calcium: 9.5 mg/dL (ref 8.9–10.3)
Chloride: 94 mmol/L — ABNORMAL LOW (ref 98–111)
Creatinine, Ser: 0.73 mg/dL (ref 0.44–1.00)
GFR, Estimated: >60 mL/min (ref >60)
Glucose, Bld: 110 mg/dL — ABNORMAL HIGH (ref 70–99)
Potassium: 4.7 mmol/L (ref 3.5–5.1)
Sodium: 134 mmol/L — ABNORMAL LOW (ref 135–145)
Total Bilirubin: 0.7 mg/dL (ref 0.3–1.2)
Total Protein: 7.5 g/dL (ref 6.5–8.1)

## 2021-01-06 LAB — BASIC METABOLIC PANEL
Anion gap: 12 (ref 5–15)
BUN: 13 mg/dL (ref 8–23)
CO2: 25 mmol/L (ref 22–32)
Calcium: 8.9 mg/dL (ref 8.9–10.3)
Chloride: 95 mmol/L — ABNORMAL LOW (ref 98–111)
Creatinine, Ser: 0.74 mg/dL (ref 0.44–1.00)
GFR, Estimated: >60 mL/min (ref >60)
Glucose, Bld: 107 mg/dL — ABNORMAL HIGH (ref 70–99)
Potassium: 6 mmol/L — ABNORMAL HIGH (ref 3.5–5.1)
Sodium: 132 mmol/L — ABNORMAL LOW (ref 135–145)

## 2021-01-06 LAB — RESP PANEL BY RT-PCR (FLU A&B, COVID) ARPGX2
Influenza A by PCR: NEGATIVE
Influenza B by PCR: NEGATIVE
SARS Coronavirus 2 by RT PCR: NEGATIVE

## 2021-01-06 LAB — BRAIN NATRIURETIC PEPTIDE: B Natriuretic Peptide: 99.8 pg/mL (ref 0.0–100.0)

## 2021-01-06 MED ORDER — IOHEXOL 350 MG/ML SOLN
75.0000 mL | Freq: Once | INTRAVENOUS | Status: AC | PRN
  Administered 2021-01-06: 75 mL via INTRAVENOUS

## 2021-01-06 NOTE — ED Provider Notes (Signed)
  Physical Exam  BP 110/64 (BP Location: Right Arm)   Pulse 93   Temp 98.5 F (36.9 C) (Oral)   Resp 15   SpO2 98%   Physical Exam Vitals and nursing note reviewed.  Constitutional:      General: She is not in acute distress.    Appearance: Normal appearance. She is well-developed.  HENT:     Head: Normocephalic and atraumatic.     Right Ear: External ear normal.     Left Ear: External ear normal.     Nose: Nose normal. No congestion or rhinorrhea.     Mouth/Throat:     Mouth: Mucous membranes are moist.  Eyes:     Extraocular Movements: Extraocular movements intact.     Conjunctiva/sclera: Conjunctivae normal.     Pupils: Pupils are equal, round, and reactive to light.  Cardiovascular:     Rate and Rhythm: Normal rate and regular rhythm.     Pulses: Normal pulses.     Heart sounds: No murmur heard. Pulmonary:     Effort: Pulmonary effort is normal. No respiratory distress.     Breath sounds: Normal breath sounds. No wheezing, rhonchi or rales.  Abdominal:     General: Abdomen is flat. Bowel sounds are normal.     Palpations: Abdomen is soft.     Tenderness: There is no abdominal tenderness. There is no guarding or rebound.  Musculoskeletal:        General: No swelling, tenderness or deformity.     Cervical back: Normal range of motion and neck supple. No rigidity.  Skin:    General: Skin is warm and dry.     Capillary Refill: Capillary refill takes less than 2 seconds.  Neurological:     General: No focal deficit present.     Mental Status: She is alert and oriented to person, place, and time.  Psychiatric:        Mood and Affect: Mood normal.    ED Course/Procedures     Procedures  MDM   87 y/o PMHx sig for HTN, HLD presenting with progressive dyspnea over the past 6 months.  Found to be hypoxic at urgent care earlier today.  Transferred here for further evaluation.  She is afebrile and hemodynamically stable.  She is satting appropriately on 2 L via nasal  cannula.  She has no abnormal lung sounds on exam.  Chest x-ray showed no acute cardiopulmonary process.  COVID flu negative.  CTA chest showed no evidence of PE.  She does have scattered pulmonary nodules that are stable compared to prior.  There is also mosaic attenuation in the lungs, likely related to chronic obstructive bronchiolitis.  Patient has had prior documented low oxygen saturations on previous admissions.  Her O2 was weaned down and she was ambulated with no significant drop in her O2 saturation below 90%.  At this time, no indications for admission.  She may be discharged home with close follow-up with pulmonology for further pulmonary testing.  Patient comfortable with plan.  Strict return precautions provided.    Idamae Lusher, MD 01/06/21 Sandrea Hughs    Varney Biles, MD 01/06/21 2150

## 2021-01-06 NOTE — Discharge Instructions (Signed)
Please go to the hospital as soon as you leave urgent care for further evaluation and management. 

## 2021-01-06 NOTE — ED Provider Notes (Signed)
[facility] EMERGENCY DEPARTMENT Provider Note   CSN: [id number] Arrival date & time: 01/06/21  9450     History Chief Complaint  Patient presents with   Weakness    [name] is a 87 y.o. female.  87 year old female with past medical history of hyperlipidemia, hypertension. Presents with complaint of shortness of breath.  Patient states she has been short of breath for the past 6 months, has been having to stop halfway up her flight of stairs to rest.  Patient had a viral respiratory illness (presumed) a week ago, and has been more short of breath ever since that time.  Also reports feeling generally weak.  Patient was seen in urgent care today however was found to have a new oxygen requirement and was sent to the emergency room.  Patient denies lower extremity edema, orthopnea, chest pain, fevers, chills.      Past Medical History:  Diagnosis Date   Arthritis    Chronic cough    x 40 yrs - unknown orgin- checked out by Dr Velora Heckler   Coronary artery disease    Diverticulitis    Hypercholesteremia    Hypertension    Internal carotid artery stenosis 08/2012   Normal cardiac stress test    @ Dr Irven Shelling office   Peripheral vascular disease [facility])    Thyroid disease     Patient Active Problem List   Diagnosis Date Noted   Macular hole, left eye 09/18/2018   Dyspnea on exertion 04/04/2018   Hypoxemia 04/04/2018   Abnormal CT of the chest 04/04/2018   Acute hypoxemic respiratory failure ([place]) 03/21/2018   CAD (coronary artery disease) 03/21/2018   HTN (hypertension) 03/21/2018   Depression 03/21/2018   Carotid stenosis 11/06/2013   Aftercare following surgery of the circulatory system 11/06/2013   Asymptomatic stenosis of right carotid artery without infarction 04/22/2013   Aftercare following surgery of the circulatory system, NEC 09/04/2012   Occlusion and stenosis of carotid artery without mention of cerebral infarction 08/14/2012     Past Surgical History:  Procedure Laterality Date   25 GAUGE PARS PLANA VITRECTOMY WITH 20 GAUGE MVR PORT FOR MACULAR HOLE Left 10/07/2018   Procedure: 25 GAUGE PARS PLANA VITRECTOMY WITH 20 GAUGE MVR PORT;  Surgeon: [name], MD;  Location: [place];  Service: Ophthalmology;  Laterality: Left;   ABDOMINAL HYSTERECTOMY     APPENDECTOMY     CARDIAC CATHETERIZATION  2007   CAROTID ANGIOGRAM N/A 04/01/2013   Procedure: CAROTID Cyril Loosen;  Surgeon: Elam Dutch, MD;  Location: [facility] CATH LAB;  Service: Cardiovascular;  Laterality: N/A;   CAROTID ENDARTERECTOMY Right August 25, 2012   cea   CAROTID STENT INSERTION Right 04/22/2013   Procedure: CAROTID STENT INSERTION;  Surgeon: Elam Dutch, MD;  Location: [facility] CATH LAB;  Service: Cardiovascular;  Laterality: Right;   COLONOSCOPY     CORONARY ARTERY BYPASS GRAFT     ENDARTERECTOMY Right 08/25/2012   Procedure: ENDARTERECTOMY CAROTID;  Surgeon: Elam Dutch, MD;  Location: [place];  Service: Vascular;  Laterality: Right;   EYE SURGERY Bilateral    both cataracts   FRACTURE SURGERY Left Jun 14, 2011   [place]   GAS INSERTION Left 10/07/2018   Procedure: GAS INJECTION LEFT EYE;  Surgeon: [name], MD;  Location: [place];  Service: Ophthalmology;  Laterality: Left;   MEMBRANE PEEL Left 10/07/2018   Procedure: INTERNAL LIMITING MEMBRANE PEEL;  Surgeon: [name], MD;  Location: [facility]  OR;  Service: Ophthalmology;  Laterality: Left;   ORIF ANKLE FRACTURE  06/14/2011   Procedure: OPEN REDUCTION INTERNAL FIXATION (ORIF) ANKLE FRACTURE;  Surgeon: Ninetta Lights, MD;  Location: [place];  Service: Orthopedics;  Laterality: Left;  left ankle fracture open treatment trimalleolar ankle includes internal fixation WITHOUT fixation of posterior lip   PHOTOCOAGULATION WITH LASER Left 10/07/2018   Procedure: PHOTOCOAGULATION WITH LASER;  Surgeon: [name], MD;  Location: [place];  Service: Ophthalmology;  Laterality: Left;    SERUM PATCH Left 10/07/2018   Procedure: LEFT EYE SERUM PATCH FOR MACULAR HOLE;  Surgeon: [name], MD;  Location: [place];  Service: Ophthalmology;  Laterality: Left;   SHOULDER ARTHROSCOPY WITH SUBACROMIAL DECOMPRESSION, ROTATOR CUFF REPAIR AND BICEP TENDON REPAIR Left 06/05/2012   Procedure: LEFT SHOULDER ARTHROSCOPY WITH SUBACROMIAL DECOMPRESSION, PARTIAL ACROMIOPLASTY WITH CORACOACROMIAL RELEASE, DISTAL CLAVICULECTOMY, WITH ROTATOR CUFF REPAIR AND BICEP TENODESIS;  Surgeon: Ninetta Lights, MD;  Location: [place];  Service: Orthopedics;  Laterality: Left;   STRABISMUS SURGERY Left 06/24/2020   Procedure: REPAIR STRABISMUS LEFT EYE;  Surgeon: Lamonte Sakai, MD;  Location: [place];  Service: Ophthalmology;  Laterality: Left;     OB History   No obstetric history on file.     Family History  Problem Relation Age of Onset   Cancer Father        Brain    Diabetes Sister    Heart disease Sister        after age 33   Hyperlipidemia Sister    Heart disease Brother        After age 61   Cancer Sister     Social History   Tobacco Use   Smoking status: Never   Smokeless tobacco: Never  Vaping Use   Vaping Use: Never used  Substance Use Topics   Alcohol use: Yes    Alcohol/week: 7.0 standard drinks    Types: 7 Glasses of wine per week    Comment: red wine   Drug use: No    Home Medications Prior to Admission medications   Medication Sig Start Date End Date Taking? Authorizing Provider  acetaminophen (TYLENOL) 500 MG tablet Take 500 mg by mouth every 6 (six) hours as needed for moderate pain or headache.    [provider]  aspirin 81 MG tablet Take 81 mg by mouth daily.    [provider]  calcium carbonate (OSCAL) 1500 (600 Ca) MG TABS tablet Take 600 mg of elemental calcium by mouth 2 (two) times daily with a meal.    [provider]  Cholecalciferol (VITAMIN D) 50 MCG (2000 UT) tablet Take 2,000 Units by mouth in the morning and  at bedtime.    [provider]  denosumab (PROLIA) 60 MG/ML SOSY injection Inject 60 mg into the skin every 6 (six) months.     [provider]  ezetimibe (ZETIA) 10 MG tablet Take 10 mg by mouth every other day. Alternates zetia one day and crestor the next    [provider]  losartan-hydrochlorothiazide (HYZAAR) 50-12.5 MG tablet Take 1 tablet by mouth daily.    [provider]  metoprolol tartrate (LOPRESSOR) 25 MG tablet Take 1 tablet (25 mg total) by mouth 2 (two) times daily. 08/29/12   Rhyne, Hulen Shouts, PA-C  rosuvastatin (CRESTOR) 20 MG tablet Take 20 mg by mouth every other day. Alternates zetia one day and crestor the next    [provider]  tobramycin-dexamethasone (TOBRADEX) ophthalmic solution Place 1 drop into the left eye 4 (four) times daily. For one week 06/24/20   Lamonte Sakai, MD    Allergies    Hydrocodone, Fenofibrate, Latex, and Oxycodone  Review of Systems   Review of Systems  Constitutional:  Negative for chills, diaphoresis and fever.  Eyes:  Negative for visual disturbance.  Respiratory:  Positive for shortness of breath. Negative for cough and wheezing.   Cardiovascular:  Negative for chest pain and leg swelling.  Gastrointestinal:  Negative for abdominal pain, diarrhea, nausea and vomiting.  Genitourinary:  Negative for dysuria.  Musculoskeletal:  Negative for arthralgias and myalgias.  Skin:  Negative for rash and wound.  Neurological:  Positive for weakness. Negative for dizziness, speech difficulty and headaches.  Psychiatric/Behavioral:  Negative for confusion.   All other systems reviewed and are negative.  Physical Exam Updated Vital Signs BP 110/64 (BP Location: Right Arm)   Pulse 93   Temp 98.5 F (36.9 C) (Oral)   Resp 15   SpO2 98%   Physical Exam Vitals and nursing note reviewed.  Constitutional:      General: She is not in acute distress.    Appearance: She is well-developed. She is not  diaphoretic.  HENT:     Head: Normocephalic and atraumatic.     Nose: Nose normal.     Mouth/Throat:     Mouth: Mucous membranes are moist.  Eyes:     Conjunctiva/sclera: Conjunctivae normal.  Cardiovascular:     Rate and Rhythm: Normal rate and regular rhythm.     Heart sounds: Normal heart sounds.  Pulmonary:     Effort: Respiratory distress present.     Breath sounds: Normal breath sounds.     Comments: Mildly tachypneic Abdominal:     Palpations: Abdomen is soft.     Tenderness: There is no abdominal tenderness.  Musculoskeletal:     Cervical back: Neck supple.     Right lower leg: No edema.     Left lower leg: No edema.  Skin:    General: Skin is warm and dry.  Neurological:     Mental Status: She is alert and oriented to person, place, and time.     Motor: No weakness.  Psychiatric:        Behavior: Behavior normal.    ED Results / Procedures / Treatments   Labs (all labs ordered are listed, but only abnormal results are displayed) Labs Reviewed  BASIC METABOLIC PANEL - Abnormal; Notable for the following components:      Result Value   Sodium 132 (*)    Potassium 6.0 (*)    Chloride 95 (*)    Glucose, Bld 107 (*)    All other components within normal limits  RESP PANEL BY RT-PCR (FLU A&B, COVID) ARPGX2  CBC WITH DIFFERENTIAL/PLATELET  CBC WITH DIFFERENTIAL/PLATELET  COMPREHENSIVE METABOLIC PANEL  BRAIN NATRIURETIC PEPTIDE    EKG EKG Interpretation  Date/Time:  Friday January 06 2021 09:28:06 EST Ventricular Rate:  97 PR Interval:  146 QRS Duration: 68 QT Interval:  342 QTC Calculation: 434 R Axis:   79 Text Interpretation: Normal sinus rhythm Normal ECG No significant change since last tracing Confirmed by Deno Etienne [phone]) on 01/06/2021 1:43:57 PM  Radiology DG Chest 2 View  Result Date: 01/06/2021 CLINICAL DATA:  Shortness of breath for several days EXAM: CHEST - 2 VIEW COMPARISON:  Chest radiograph 03/21/2018 FINDINGS: Median sternotomy wires  and mediastinal surgical clips are stable. The cardiomediastinal  silhouette is within normal limits. There is no focal consolidation or pulmonary edema. There is no pleural effusion or pneumothorax There is no acute osseous abnormality. IMPRESSION: No radiographic evidence of acute cardiopulmonary process. Electronically Signed   By: Valetta Mole M.D.   On: 01/06/2021 10:17    Procedures .Critical Care Performed by: Tacy Learn, PA-C Authorized by: Tacy Learn, PA-C   Critical care provider statement:    Critical care time (minutes):  30   Critical care was time spent personally by me on the following activities:  Development of treatment plan with patient or surrogate, discussions with consultants, evaluation of patient's response to treatment, examination of patient, ordering and review of laboratory studies, ordering and review of radiographic studies, ordering and performing treatments and interventions, pulse oximetry, re-evaluation of patient's condition and review of old charts   Medications Ordered in ED Medications - No data to display  ED Course  I have reviewed the triage vital signs and the nursing notes.  Pertinent labs & imaging results that were available during my care of the patient were reviewed by me and considered in my medical decision making (see chart for details).  Clinical Course as of 01/06/21 1506  Fri Jan 06, 1959  3599 87 year old female with shortness of breath as above.  On exam, patient is mildly tachypneic, she is on 2 L nasal cannula and satting 98%.  Nasal cannula was discontinued and O2 sat drops to 87% on room air.  Nasal cannula replaced with improvement. Labs obtained in triage, patient is negative for COVID and flu.  BMP reporting hemolyzed sample with a potassium of 6.0.  Normal creatinine.  Plan is to recollect her chemistry, will also send BNP.  CTA chest ordered for further evaluation for possible PE.  Chest x-ray is unremarkable. Care to be  signed out to oncoming team pending work-up and likely admission for her new oxygen requirement. [LM]    Clinical Course User Index [LM] Roque Lias   MDM Rules/Calculators/A&P                           Final Clinical Impression(s) / ED Diagnoses Final diagnoses:  Shortness of breath  Hypoxia    Rx / DC Orders ED Discharge Orders     None        Tacy Learn, PA-C 01/06/21 [place], DO 01/06/21 1513

## 2021-01-06 NOTE — ED Triage Notes (Addendum)
Patient here with complaint of generalized weakness and shortness of breath for the last several days. SpO2 90% on room air, denies history of CHF and COPD. Patient alert, oriented, and in no apparent distress at this time.

## 2021-01-06 NOTE — ED Provider Notes (Signed)
[facility]    CSN: [id number] Arrival date & time: 01/06/21  3154      History   Chief Complaint Chief Complaint  Patient presents with   Fatigue    HPI [name] is a 87 y.o. female.   Patient presents with weakness, dry cough, shortness of breath that has been present for approximately 1 week.  Patient denies any fevers or any known sick contacts.  Denies any chronic lung diseases such as COPD or asthma.  Patient reports that shortness of breath is different from her baseline.  Denies any upper respiratory symptoms, sore throat, ear pain, nausea, vomiting, diarrhea, abdominal pain.  Pertinent medical history includes CAD, CABG in 2007, hypertension.     Past Medical History:  Diagnosis Date   Arthritis    Chronic cough    x 40 yrs - unknown orgin- checked out by Dr Velora Heckler   Coronary artery disease    Diverticulitis    Hypercholesteremia    Hypertension    Internal carotid artery stenosis 08/2012   Normal cardiac stress test    @ Dr Irven Shelling office   Peripheral vascular disease [facility])     Patient Active Problem List   Diagnosis Date Noted   Macular hole, left eye 09/18/2018   Dyspnea on exertion 04/04/2018   Hypoxemia 04/04/2018   Abnormal CT of the chest 04/04/2018   Acute hypoxemic respiratory failure ([place]) 03/21/2018   CAD (coronary artery disease) 03/21/2018   HTN (hypertension) 03/21/2018   Depression 03/21/2018   Carotid stenosis 11/06/2013   Aftercare following surgery of the circulatory system 11/06/2013   Asymptomatic stenosis of right carotid artery without infarction 04/22/2013   Aftercare following surgery of the circulatory system, NEC 09/04/2012   Occlusion and stenosis of carotid artery without mention of cerebral infarction 08/14/2012    Past Surgical History:  Procedure Laterality Date   25 GAUGE PARS PLANA VITRECTOMY WITH 20 GAUGE MVR PORT FOR MACULAR HOLE Left 10/07/2018   Procedure: 25 GAUGE PARS PLANA  VITRECTOMY WITH 20 GAUGE MVR PORT;  Surgeon: [name], MD;  Location: [place];  Service: Ophthalmology;  Laterality: Left;   ABDOMINAL HYSTERECTOMY     APPENDECTOMY     CARDIAC CATHETERIZATION  2007   CAROTID ANGIOGRAM N/A 04/01/2013   Procedure: CAROTID Cyril Loosen;  Surgeon: Elam Dutch, MD;  Location: [facility] CATH LAB;  Service: Cardiovascular;  Laterality: N/A;   CAROTID ENDARTERECTOMY Right August 25, 2012   cea   CAROTID STENT INSERTION Right 04/22/2013   Procedure: CAROTID STENT INSERTION;  Surgeon: Elam Dutch, MD;  Location: [facility] CATH LAB;  Service: Cardiovascular;  Laterality: Right;   COLONOSCOPY     CORONARY ARTERY BYPASS GRAFT     ENDARTERECTOMY Right 08/25/2012   Procedure: ENDARTERECTOMY CAROTID;  Surgeon: Elam Dutch, MD;  Location: [place];  Service: Vascular;  Laterality: Right;   EYE SURGERY Bilateral    both cataracts   FRACTURE SURGERY Left Jun 14, 2011   [place]   GAS INSERTION Left 10/07/2018   Procedure: GAS INJECTION LEFT EYE;  Surgeon: [name], MD;  Location: [place];  Service: Ophthalmology;  Laterality: Left;   MEMBRANE PEEL Left 10/07/2018   Procedure: INTERNAL LIMITING MEMBRANE PEEL;  Surgeon: [name], MD;  Location: [place];  Service: Ophthalmology;  Laterality: Left;   ORIF ANKLE FRACTURE  06/14/2011   Procedure: OPEN REDUCTION INTERNAL FIXATION (ORIF) ANKLE FRACTURE;  Surgeon: Ninetta Lights, MD;  Location: [place]  [facility];  Service: Orthopedics;  Laterality: Left;  left ankle fracture open treatment trimalleolar ankle includes internal fixation WITHOUT fixation of posterior lip   PHOTOCOAGULATION WITH LASER Left 10/07/2018   Procedure: PHOTOCOAGULATION WITH LASER;  Surgeon: [name], MD;  Location: [place];  Service: Ophthalmology;  Laterality: Left;   SERUM PATCH Left 10/07/2018   Procedure: LEFT EYE SERUM PATCH FOR MACULAR HOLE;  Surgeon: [name], MD;  Location: [place];  Service: Ophthalmology;  Laterality: Left;    SHOULDER ARTHROSCOPY WITH SUBACROMIAL DECOMPRESSION, ROTATOR CUFF REPAIR AND BICEP TENDON REPAIR Left 06/05/2012   Procedure: LEFT SHOULDER ARTHROSCOPY WITH SUBACROMIAL DECOMPRESSION, PARTIAL ACROMIOPLASTY WITH CORACOACROMIAL RELEASE, DISTAL CLAVICULECTOMY, WITH ROTATOR CUFF REPAIR AND BICEP TENODESIS;  Surgeon: Ninetta Lights, MD;  Location: [place];  Service: Orthopedics;  Laterality: Left;   STRABISMUS SURGERY Left 06/24/2020   Procedure: REPAIR STRABISMUS LEFT EYE;  Surgeon: Lamonte Sakai, MD;  Location: [place];  Service: Ophthalmology;  Laterality: Left;    OB History   No obstetric history on file.      Home Medications    Prior to Admission medications   Medication Sig Start Date End Date Taking? Authorizing Provider  acetaminophen (TYLENOL) 500 MG tablet Take 500 mg by mouth every 6 (six) hours as needed for moderate pain or headache.    [provider]  aspirin 81 MG tablet Take 81 mg by mouth daily.    [provider]  calcium carbonate (OSCAL) 1500 (600 Ca) MG TABS tablet Take 600 mg of elemental calcium by mouth 2 (two) times daily with a meal.    [provider]  Cholecalciferol (VITAMIN D) 50 MCG (2000 UT) tablet Take 2,000 Units by mouth in the morning and at bedtime.    [provider]  denosumab (PROLIA) 60 MG/ML SOSY injection Inject 60 mg into the skin every 6 (six) months.     [provider]  ezetimibe (ZETIA) 10 MG tablet Take 10 mg by mouth every other day. Alternates zetia one day and crestor the next    [provider]  losartan-hydrochlorothiazide (HYZAAR) 50-12.5 MG tablet Take 1 tablet by mouth daily.    [provider]  metoprolol tartrate (LOPRESSOR) 25 MG tablet Take 1 tablet (25 mg total) by mouth 2 (two) times daily. 08/29/12   Rhyne, Hulen Shouts, PA-C  rosuvastatin (CRESTOR) 20 MG tablet Take 20 mg by mouth every other day. Alternates zetia one day and crestor the next    [provider]  tobramycin-dexamethasone (TOBRADEX) ophthalmic solution Place 1 drop into the left eye 4 (four) times daily. For one week 06/24/20   Lamonte Sakai, MD    Family History Family History  Problem Relation Age of Onset   Cancer Father        Brain    Diabetes Sister    Heart disease Sister        after age 72   Hyperlipidemia Sister    Heart disease Brother        After age 67   Cancer Sister     Social History Social History   Tobacco Use   Smoking status: Never   Smokeless tobacco: Never  Vaping Use   Vaping Use: Never used  Substance Use Topics   Alcohol use: Yes    Alcohol/week: 7.0 standard drinks    Types: 7 Glasses of wine per week    Comment: red wine   Drug use: No  Allergies   Hydrocodone, Fenofibrate, Latex, and Oxycodone   Review of Systems Review of Systems Per HPI  Physical Exam Triage Vital Signs ED Triage Vitals  Enc Vitals Group     BP 01/06/21 0827 (!) 142/73     Pulse Rate 01/06/21 0827 96     Resp 01/06/21 0827 (!) 22     Temp 01/06/21 0827 98.3 F (36.8 C)     Temp Source 01/06/21 0827 Oral     SpO2 01/06/21 0827 (!) 89 %     Weight --      Height --      Head Circumference --      Peak Flow --      Pain Score 01/06/21 0829 0     Pain Loc --      Pain Edu? --      Excl. in [place]? --    No data found.  Updated Vital Signs BP (!) 142/73 (BP Location: Left Arm)   Pulse 96   Temp 98.3 F (36.8 C) (Oral)   Resp (!) 22   SpO2 (!) 89%   Visual Acuity Right Eye Distance:   Left Eye Distance:   Bilateral Distance:    Right Eye Near:   Left Eye Near:    Bilateral Near:     Physical Exam Constitutional:      General: She is not in acute distress.    Appearance: Normal appearance. She is ill-appearing and toxic-appearing. She is not diaphoretic.  HENT:     Head: Normocephalic and atraumatic.     Right Ear: Tympanic membrane and ear canal normal.     Left Ear: Tympanic membrane and ear canal normal.     Nose:  Nose normal.     Mouth/Throat:     Mouth: Mucous membranes are moist.     Pharynx: No posterior oropharyngeal erythema.  Eyes:     Extraocular Movements: Extraocular movements intact.     Conjunctiva/sclera: Conjunctivae normal.     Pupils: Pupils are equal, round, and reactive to light.  Cardiovascular:     Rate and Rhythm: Normal rate and regular rhythm.     Pulses: Normal pulses.     Heart sounds: Normal heart sounds.  Pulmonary:     Breath sounds: Rhonchi present.     Comments: Tachypnea present. Skin:    General: Skin is warm and dry.  Neurological:     General: No focal deficit present.     Mental Status: She is alert and oriented to person, place, and time. Mental status is at baseline.  Psychiatric:        Mood and Affect: Mood normal.        Behavior: Behavior normal.        Thought Content: Thought content normal.        Judgment: Judgment normal.     UC Treatments / Results  Labs (all labs ordered are listed, but only abnormal results are displayed) Labs Reviewed - No data to display  EKG   Radiology No results found.  Procedures Procedures (including critical care time)  Medications Ordered in UC Medications - No data to display  Initial Impression / Assessment and Plan / UC Course  I have reviewed the triage vital signs and the nursing notes.  Pertinent labs & imaging results that were available during my care of the patient were reviewed by me and considered in my medical decision making (see chart for details).     Patient presenting with  tachypnea and oxygen saturation ranging from 89 to 90%.  Advised patient that she will need to go to the hospital for further evaluation and management given current physical exam and vital signs.  Advised patient that EMS transport will be best but patient declined EMS transport.  Patient was agreeable to letting her family member who was present here in urgent care take her to the hospital.  Patient was agreeable  with plan and voiced understanding. Final Clinical Impressions(s) / UC Diagnoses   Final diagnoses:  Shortness of breath  Acute cough  Other fatigue     Discharge Instructions      Please go to the hospital as soon as you leave urgent care for further evaluation and management.    ED Prescriptions   None    PDMP not reviewed this encounter.   Teodora Medici, [place] 01/06/21 [phone]

## 2021-01-06 NOTE — Discharge Instructions (Addendum)
Please follow-up with the pulmonologist to discuss your chronic shortness of breath and abnormal CT scan of the lungs. Report below.  CTA Chest 01/06/21 IMPRESSION: 1. No filling defect is identified in the pulmonary arterial tree to suggest pulmonary embolus. 2. Stable appearance of scattered pulmonary nodules in the lungs measuring up to 0.7 cm in diameter, not changed from 03/21/2018. Presumably these are postinflammatory or postinfectious given the long-term stability. 3. Continued appearance of mosaic attenuation in the lungs, with characteristics favoring either chronic obstructive bronchiolitis or remote chronic pulmonary embolus (no acute pulmonary embolus is present). 4. Other imaging findings of potential clinical significance: Aortic Atherosclerosis (ICD10-I70.0). Coronary atherosclerosis with prior CABG. Mild mitral valve calcification.

## 2021-01-06 NOTE — ED Triage Notes (Signed)
Pt c/o general malaise, cough,   Denies sore throat, nasal congestion, ear aches, head aches, body aches or chills, nausea, vomiting, diarrhea, constipation.   Onset last week

## 2021-01-06 NOTE — ED Notes (Addendum)
Pt ambulated without difficulty, sob or increased weakness.  No increase in sob, rr or decrease in O2 sats. MD notified.

## 2021-01-06 NOTE — ED Provider Notes (Signed)
Emergency Medicine Provider Triage Evaluation Note  [name] , a 87 y.o. female  was evaluated in triage.  Pt complains of generalized weakness onset 1 week.  Patient has associated shortness of breath.  She has not tried any medications for his symptoms.  Patient was evaluated in urgent care and informed to come to the ED for further evaluation.  Patient denies chest pain, abdominal pain, nausea, vomiting, fever, chills. Patient does not wear oxygen at baseline.  Patient denies sick contacts.  Denies history of CHF, asthma, COPD.   Per patient chart review: Patient was seen at urgent care and sent to the ED for further evaluation of her symptoms.  Review of Systems  Positive: Shortness of breath Negative: Chest pain  Physical Exam  BP (!) 119/96 (BP Location: Right Arm)   Pulse 99   Temp 98.5 F (36.9 C) (Oral)   Resp (!) 26   SpO2 91%  Gen:   Awake, no distress   Resp:  Normal effort MSK:   Moves extremities without difficulty   Medical Decision Making  Medically screening exam initiated at 9:37 AM.  Appropriate orders placed.  [name] was informed that the remainder of the evaluation will be completed by another provider, this initial triage assessment does not replace that evaluation, and the importance of remaining in the ED until their evaluation is complete.  O2 supplementation via nasal cannula started in MSE.  9:39 AM -discussed with RN that patient will need next available room. RN aware.    Savan Ruta A, PA-C 01/06/21 0722    Lorelle Gibbs, DO 01/06/21 1021

## 2021-01-10 ENCOUNTER — Other Ambulatory Visit: Payer: Self-pay

## 2021-01-10 ENCOUNTER — Encounter: Payer: Self-pay | Admitting: Internal Medicine

## 2021-01-10 ENCOUNTER — Ambulatory Visit (INDEPENDENT_AMBULATORY_CARE_PROVIDER_SITE_OTHER): Payer: Medicare Other | Admitting: Internal Medicine

## 2021-01-10 VITALS — BP 146/82 | HR 79 | Temp 98.3°F | Ht 62.0 in | Wt 138.4 lb

## 2021-01-10 DIAGNOSIS — R0602 Shortness of breath: Secondary | ICD-10-CM | POA: Diagnosis not present

## 2021-01-10 DIAGNOSIS — R9389 Abnormal findings on diagnostic imaging of other specified body structures: Secondary | ICD-10-CM | POA: Diagnosis not present

## 2021-01-10 MED ORDER — OMEPRAZOLE 40 MG PO CPDR: 40.0000 mg | DELAYED_RELEASE_CAPSULE | Freq: Every day | ORAL | 3 refills | Status: DC

## 2021-01-10 NOTE — Patient Instructions (Addendum)
Please schedule follow up scheduled with myself in 6 weeks.  If my schedule is not open yet, we will contact you with a reminder closer to that time. Please call [phone] if you haven't heard from [place] a month before.   Before your next visit I would like you to have: Full set of PFTs - 45 min, appointment with me afterwards.   Start omeprazole once a day. This is an acid reflux medicine. We will see what this does for your cough.

## 2021-01-10 NOTE — Progress Notes (Signed)
[name]    [id number]    May 26, 1933  Primary Care Physician:Kim, Jeneen Rinks, MD  Referring Physician: Varney Biles, [address] [place],  [place] [id number] Reason for Consultation: shortness of breath Date of Consultation: 01/10/2021  Chief complaint:   Chief Complaint  Patient presents with   Consult    SOB during exertion and at rest, especially at night     HPI: [name] is a 87 y.o. who presents for shortness of breath progressing over the last 3-4 months. Shortness of breath is usually with exertion.  She coughs up thick clear mucus all day and it also wakes her up at night.  Mucus is thick and hard to bring up.  She does not have sinus congestion but does have post nasal drainage. This is chronic.   She has tried inhalers before and did not perceive a benefit - this was 15 years ago.   She has a decreased appetite and has lost 5 lbs unintentionally in the last month.   No fevers, chills but she does have night sweats which have been going on for the past 3-4 months  She had an episode of bronchitis when she was in her 54s and since then has had trouble breathing.  No childhood respiratory disease or asthma.  No family history of lung disease.   Upon further chart review she has had chronic hypoxemia without a good explanation for several years. She has never seen a pulmonologist.   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  Exposures: lives at home independently.  Smoking history: never smoker, passive smoke exposure in spouse.   Social History   Occupational History   Not on file  Tobacco Use   Smoking status: Never   Smokeless tobacco: Never  Vaping Use   Vaping Use: Never used  Substance and Sexual Activity   Alcohol use: Yes    Alcohol/week: 7.0 standard drinks    Types: 7 Glasses of wine per week    Comment: red wine   Drug use: No   Sexual  activity: Never    Birth control/protection: Post-menopausal    Relevant family history:  Family History  Problem Relation Age of Onset   Cancer Father        Brain    Diabetes Sister    Heart disease Sister        after age 85   Hyperlipidemia Sister    Heart disease Brother        After age 59   Cancer Sister     Past Medical History:  Diagnosis Date   Arthritis    Chronic cough    x 40 yrs - unknown orgin- checked out by Dr Velora Heckler   Coronary artery disease    Diverticulitis    Hypercholesteremia    Hypertension    Internal carotid artery stenosis 08/2012   Normal cardiac stress test    @ Dr Irven Shelling office   Peripheral vascular disease [facility])    Thyroid disease     Past Surgical History:  Procedure Laterality Date   25 GAUGE PARS PLANA VITRECTOMY WITH 20 GAUGE MVR PORT FOR MACULAR HOLE Left 10/07/2018   Procedure: 25 GAUGE PARS PLANA VITRECTOMY WITH 20 GAUGE MVR PORT;  Surgeon: [name], MD;  Location: [place];  Service: Ophthalmology;  Laterality: Left;   ABDOMINAL HYSTERECTOMY     APPENDECTOMY  CARDIAC CATHETERIZATION  2007   CAROTID ANGIOGRAM N/A 04/01/2013   Procedure: CAROTID Cyril Loosen;  Surgeon: Elam Dutch, MD;  Location: [facility] CATH LAB;  Service: Cardiovascular;  Laterality: N/A;   CAROTID ENDARTERECTOMY Right August 25, 2012   cea   CAROTID STENT INSERTION Right 04/22/2013   Procedure: CAROTID STENT INSERTION;  Surgeon: Elam Dutch, MD;  Location: [facility] CATH LAB;  Service: Cardiovascular;  Laterality: Right;   COLONOSCOPY     CORONARY ARTERY BYPASS GRAFT     ENDARTERECTOMY Right 08/25/2012   Procedure: ENDARTERECTOMY CAROTID;  Surgeon: Elam Dutch, MD;  Location: [place];  Service: Vascular;  Laterality: Right;   EYE SURGERY Bilateral    both cataracts   FRACTURE SURGERY Left Jun 14, 2011   Ankle   GAS INSERTION Left 10/07/2018   Procedure: GAS INJECTION LEFT EYE;  Surgeon: [name], MD;  Location: [place];  Service: Ophthalmology;   Laterality: Left;   MEMBRANE PEEL Left 10/07/2018   Procedure: INTERNAL LIMITING MEMBRANE PEEL;  Surgeon: [name], MD;  Location: [place];  Service: Ophthalmology;  Laterality: Left;   ORIF ANKLE FRACTURE  06/14/2011   Procedure: OPEN REDUCTION INTERNAL FIXATION (ORIF) ANKLE FRACTURE;  Surgeon: Ninetta Lights, MD;  Location: [place];  Service: Orthopedics;  Laterality: Left;  left ankle fracture open treatment trimalleolar ankle includes internal fixation WITHOUT fixation of posterior lip   PHOTOCOAGULATION WITH LASER Left 10/07/2018   Procedure: PHOTOCOAGULATION WITH LASER;  Surgeon: [name], MD;  Location: [place];  Service: Ophthalmology;  Laterality: Left;   SERUM PATCH Left 10/07/2018   Procedure: LEFT EYE SERUM PATCH FOR MACULAR HOLE;  Surgeon: [name], MD;  Location: [place];  Service: Ophthalmology;  Laterality: Left;   SHOULDER ARTHROSCOPY WITH SUBACROMIAL DECOMPRESSION, ROTATOR CUFF REPAIR AND BICEP TENDON REPAIR Left 06/05/2012   Procedure: LEFT SHOULDER ARTHROSCOPY WITH SUBACROMIAL DECOMPRESSION, PARTIAL ACROMIOPLASTY WITH CORACOACROMIAL RELEASE, DISTAL CLAVICULECTOMY, WITH ROTATOR CUFF REPAIR AND BICEP TENODESIS;  Surgeon: Ninetta Lights, MD;  Location: [place];  Service: Orthopedics;  Laterality: Left;   STRABISMUS SURGERY Left 06/24/2020   Procedure: REPAIR STRABISMUS LEFT EYE;  Surgeon: Lamonte Sakai, MD;  Location: [place];  Service: Ophthalmology;  Laterality: Left;     Physical Exam: Blood pressure (!) 146/82, pulse 79, temperature 98.3 F (36.8 C), height 5\' 2"  (1.575 m), weight 138 lb 6.4 oz (62.8 kg), SpO2 91 %. Gen:      No acute distress, hoarse voice ENT:  no nasal polyps, mucus membranes moist Lungs:    No increased respiratory effort, symmetric chest wall excursion, clear to auscultation bilaterally, no wheezes or crackles CV:         Regular rate and rhythm; no murmurs, rubs, or gallops.  No pedal edema Abd:      + bowel  sounds; soft, non-tender; no distension MSK: no acute synovitis of DIP or PIP joints, no mechanics hands. +red nail polish Skin:      Warm and dry; no rashes Neuro: normal speech, no focal facial asymmetry Psych: alert and oriented x3, normal mood and affect   Data Reviewed/Medical Decision Making:  Independent interpretation of tests: Imaging:  Review of patient's CTPE study Dec 2022 images revealed diffuse mosaic attentuation and stable sub cm nodules stable since 2020 scan. No PE. The patient's images have been independently reviewed by me.    PFTs: No flowsheet data found.  Labs:  Lab Results  Component Value Date  WBC 7.0 01/06/2021   HGB 14.5 01/06/2021   HCT 43.9 01/06/2021   MCV 99.1 01/06/2021   PLT 356 01/06/2021   Lab Results  Component Value Date   NA 134 (L) 01/06/2021   K 4.7 01/06/2021   CL 94 (L) 01/06/2021   CO2 30 01/06/2021     Immunization status:  Immunization History  Administered Date(s) Administered   Influenza, High Dose Seasonal PF 10/10/2020   Influenza-Unspecified 11/05/2012   Zoster Recombinat (Shingrix) 03/21/2018     I reviewed prior external note(s) from ED, cardiology, ophtho  I reviewed the result(s) of the labs and imaging as noted above.   I have ordered PFTs  Assessment:  Shortness of breath Chronic cough Abnormal CT Chest with mosaic attenuation and stable pulmonary nodules CAD s/p CABG  Plan/Recommendations:  Differential diagnosis for abnormal CT Chest with with constrictive bronchiolitis and dyspnea includes chronic airways disease, chronic HP, DIPNECH. Will obtain PFTs.  Will discuss case with radiology.   We did ambulatory desat study with forehead probe - she is wearing red nail polish which interferes pulse oximetry. She has a resting O2 sat of 96% and desaturates to 91% without hypoxemia.   Cough is likely UACS excerbated by reflux. She says she has never tried a PPI. Will start omeprazole 1 pill once daily for  8 week trial   We discussed disease management and progression at length today.    Return to Care: Return in about 6 weeks (around 02/21/2021).  Lenice Llamas, MD Pulmonary and [place]  CC: Varney Biles, MD

## 2021-01-12 ENCOUNTER — Ambulatory Visit: Payer: Medicare Other | Admitting: Cardiology

## 2021-01-17 ENCOUNTER — Ambulatory Visit: Payer: Self-pay | Admitting: Pulmonary Disease

## 2021-01-17 DIAGNOSIS — J984 Other disorders of lung: Secondary | ICD-10-CM | POA: Diagnosis not present

## 2021-01-17 DIAGNOSIS — R9389 Abnormal findings on diagnostic imaging of other specified body structures: Secondary | ICD-10-CM

## 2021-01-18 ENCOUNTER — Encounter (INDEPENDENT_AMBULATORY_CARE_PROVIDER_SITE_OTHER): Payer: Medicare Other | Admitting: Ophthalmology

## 2021-01-18 ENCOUNTER — Other Ambulatory Visit: Payer: Self-pay

## 2021-01-18 DIAGNOSIS — H35342 Macular cyst, hole, or pseudohole, left eye: Secondary | ICD-10-CM

## 2021-01-18 DIAGNOSIS — H43811 Vitreous degeneration, right eye: Secondary | ICD-10-CM

## 2021-01-18 DIAGNOSIS — H59032 Cystoid macular edema following cataract surgery, left eye: Secondary | ICD-10-CM

## 2021-01-18 DIAGNOSIS — H353112 Nonexudative age-related macular degeneration, right eye, intermediate dry stage: Secondary | ICD-10-CM | POA: Diagnosis not present

## 2021-01-18 DIAGNOSIS — H35033 Hypertensive retinopathy, bilateral: Secondary | ICD-10-CM

## 2021-01-18 DIAGNOSIS — I10 Essential (primary) hypertension: Secondary | ICD-10-CM | POA: Diagnosis not present

## 2021-01-23 NOTE — Progress Notes (Signed)
° °  Interstitial Lung Disease Multidisciplinary Conference   [name]    MRN [id number]    DOB 1933/09/23  Primary Care Physician:Kim, Jeneen Rinks, MD  Referring Physician: Dr. Lenice Llamas  Time of Conference: 7.30am- 8.30am Date of conference: 01/17/2021 Location of Conference: -  Virtual  Participating Pulmonary: Dr. Brand Males, MD,  Dr Marshell Garfinkel, MD Pathology:  Radiology: Dr Vinnie Langton MD Others:   Brief History:  Has had chronic desaturation without hypoxemia. Not responsive to inhaler therapies. Non smoker. Now having night sweats and weight loss. PFTs pending. Question is mosaic attenuation with pulmonary nodules, could this be DIPNECH?    MDD discussion of CT scan   CT angio 01/06/2021 reviewed with groundglass nodules measuring up to 0.7 cm.  Although mosaic attenuation is reported on the CT scan this is hard to tell from a CT angiogram.   MDD Impression/Recs:  Recommend HRCT with inspiratory and expiratory views for better characterization of mosaic attenuation and air trapping to make a diagnosis of DIPNECH.  Could also consider dotatate scan for diagnosis of DIPNECH, carcinoid but the lung nodule may be below the limit of resolution for the scan  SIGNATURE   Marshell Garfinkel MD [facility] See Amion for pager  If no response to pager , please call [phone] until 7pm After 7:00 pm call Elink  [phone] 01/23/2021, 10:13 AM   ..

## 2021-02-16 ENCOUNTER — Encounter: Payer: Self-pay | Admitting: Internal Medicine

## 2021-02-16 ENCOUNTER — Ambulatory Visit (INDEPENDENT_AMBULATORY_CARE_PROVIDER_SITE_OTHER): Payer: Medicare Other | Admitting: Internal Medicine

## 2021-02-16 ENCOUNTER — Other Ambulatory Visit: Payer: Self-pay

## 2021-02-16 VITALS — BP 140/82 | HR 79 | Temp 98.1°F | Ht 61.0 in | Wt 138.0 lb

## 2021-02-16 DIAGNOSIS — R9389 Abnormal findings on diagnostic imaging of other specified body structures: Secondary | ICD-10-CM

## 2021-02-16 DIAGNOSIS — R0602 Shortness of breath: Secondary | ICD-10-CM

## 2021-02-16 DIAGNOSIS — J984 Other disorders of lung: Secondary | ICD-10-CM | POA: Diagnosis not present

## 2021-02-16 LAB — PULMONARY FUNCTION TEST
DL/VA % pred: 111 %
DL/VA: 4.61 ml/min/mmHg/L
DLCO cor % pred: 75 %
DLCO cor: 12.53 ml/min/mmHg
DLCO unc % pred: 77 %
DLCO unc: 12.94 ml/min/mmHg
RV % pred: 105 %
RV: 2.47 L
TLC % pred: 78 %
TLC: 3.62 L

## 2021-02-16 NOTE — Progress Notes (Signed)
DLCO, Plethysmography performed today. Patient was unable to perform spirometry without cough.

## 2021-02-16 NOTE — Patient Instructions (Addendum)
I will contact you for follow up after the results of the CT scan.  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 In you it is causing lots of nodules or spots in your lungs.  I will get a high resolution CT scan for better clarity.

## 2021-02-16 NOTE — Patient Instructions (Signed)
Patient unable to perform spirometry without cough. Diffusion capacity and lung volumes performed today.

## 2021-02-16 NOTE — Progress Notes (Signed)
[name]    [id number]    April 10, 1933  Primary Care Physician:Kim, Jeneen Rinks, MD Date of Appointment: 02/16/2021 Established Patient Visit  Chief complaint:   Chief Complaint  Patient presents with   Follow-up    PFT results      HPI: [name] is a 88 y.o. woman with shortness of breath and chronic cough  Interval Updates: Here for follow up after PFTS. Was not able to perform spirometry. Lung volumes and diffusion capacity were normal. Still with chronic cough and dyspnea. Her sats are 93% on room air but I ambulated her at last visit and she had desaturation without hypoxemia.   Has been on multiple inhalers in the past without relief.   Discussed with radiology and in ILD conference - concern for [place] I have reviewed the patient's family social and past medical history and updated as appropriate.   Past Medical History:  Diagnosis Date   Arthritis    Chronic cough    x 40 yrs - unknown orgin- checked out by Dr Velora Heckler   Coronary artery disease    Diverticulitis    Hypercholesteremia    Hypertension    Internal carotid artery stenosis 08/2012   Normal cardiac stress test    @ Dr Irven Shelling office   Peripheral vascular disease [facility])    Thyroid disease     Past Surgical History:  Procedure Laterality Date   25 GAUGE PARS PLANA VITRECTOMY WITH 20 GAUGE MVR PORT FOR MACULAR HOLE Left 10/07/2018   Procedure: 25 GAUGE PARS PLANA VITRECTOMY WITH 20 GAUGE MVR PORT;  Surgeon: [name], MD;  Location: [place];  Service: Ophthalmology;  Laterality: Left;   ABDOMINAL HYSTERECTOMY     APPENDECTOMY     CARDIAC CATHETERIZATION  2007   CAROTID ANGIOGRAM N/A 04/01/2013   Procedure: CAROTID Cyril Loosen;  Surgeon: Elam Dutch, MD;  [facility];  Service: Cardiovascular;  Laterality: N/A;   CAROTID ENDARTERECTOMY Right August 25, 2012   cea   CAROTID STENT INSERTION Right 04/22/2013   Procedure: CAROTID STENT INSERTION;   Surgeon: Elam Dutch, MD;  Location: [facility] CATH LAB;  Service: Cardiovascular;  Laterality: Right;   COLONOSCOPY     CORONARY ARTERY BYPASS GRAFT     ENDARTERECTOMY Right 08/25/2012   Procedure: ENDARTERECTOMY CAROTID;  Surgeon: Elam Dutch, MD;  Location: [place];  Service: Vascular;  Laterality: Right;   EYE SURGERY Bilateral    both cataracts   FRACTURE SURGERY Left Jun 14, 2011   Ankle   GAS INSERTION Left 10/07/2018   Procedure: GAS INJECTION LEFT EYE;  Surgeon: [name], MD;  Location: [place];  Service: Ophthalmology;  Laterality: Left;   MEMBRANE PEEL Left 10/07/2018   Procedure: INTERNAL LIMITING MEMBRANE PEEL;  Surgeon: [name], MD;  Location: [place];  Service: Ophthalmology;  Laterality: Left;   ORIF ANKLE FRACTURE  06/14/2011   Procedure: OPEN REDUCTION INTERNAL FIXATION (ORIF) ANKLE FRACTURE;  Surgeon: Ninetta Lights, MD;  Location: [place];  Service: Orthopedics;  Laterality: Left;  left ankle fracture open treatment trimalleolar ankle includes internal fixation WITHOUT fixation of posterior lip   PHOTOCOAGULATION WITH LASER Left 10/07/2018   Procedure: PHOTOCOAGULATION WITH LASER;  Surgeon: [name], MD;  Location: [place];  Service: Ophthalmology;  Laterality: Left;   SERUM PATCH Left 10/07/2018   Procedure: LEFT EYE SERUM PATCH FOR MACULAR HOLE;  Surgeon: Tempie Hoist  D, MD;  Location: [place];  Service: Ophthalmology;  Laterality: Left;   SHOULDER ARTHROSCOPY WITH SUBACROMIAL DECOMPRESSION, ROTATOR CUFF REPAIR AND BICEP TENDON REPAIR Left 06/05/2012   Procedure: LEFT SHOULDER ARTHROSCOPY WITH SUBACROMIAL DECOMPRESSION, PARTIAL ACROMIOPLASTY WITH CORACOACROMIAL RELEASE, DISTAL CLAVICULECTOMY, WITH ROTATOR CUFF REPAIR AND BICEP TENODESIS;  Surgeon: Ninetta Lights, MD;  Location: [place];  Service: Orthopedics;  Laterality: Left;   STRABISMUS SURGERY Left 06/24/2020   Procedure: REPAIR STRABISMUS LEFT EYE;  Surgeon: Lamonte Sakai,  MD;  Location: [place];  Service: Ophthalmology;  Laterality: Left;    Family History  Problem Relation Age of Onset   Cancer Father        Brain    Diabetes Sister    Heart disease Sister        after age 70   Hyperlipidemia Sister    Heart disease Brother        After age 29   Cancer Sister     Social History   Occupational History   Not on file  Tobacco Use   Smoking status: Never   Smokeless tobacco: Never  Vaping Use   Vaping Use: Never used  Substance and Sexual Activity   Alcohol use: Yes    Alcohol/week: 7.0 standard drinks    Types: 7 Glasses of wine per week    Comment: red wine   Drug use: No   Sexual activity: Never    Birth control/protection: Post-menopausal     Physical Exam: Blood pressure 140/82, pulse 79, temperature 98.1 F (36.7 C), temperature source Oral, height 5\' 1"  (1.549 m), weight 138 lb (62.6 kg), SpO2 93 %.  Gen:      No acute distress, frequent coughing ENT:  no nasal polyps, mucus membranes moist Lungs:    No increased respiratory effort, symmetric chest wall excursion, clear to auscultation bilaterally, no wheezes or crackles CV:         Regular rate and rhythm; no murmurs, rubs, or gallops.  No pedal edema   Data Reviewed: Imaging: I have personally reviewed the Ct Chest PE study which shows air trapping and multiple pulmonary nodules.   PFTs:  PFT Results Latest Ref Rng & Units 02/16/2021  DLCO uncorrected ml/min/mmHg 12.94  DLCO UNC% % 77  DLCO corrected ml/min/mmHg 12.53  DLCO COR %Predicted % 75  DLVA Predicted % 111  TLC L 3.62  TLC % Predicted % 78  RV % Predicted % 105   I have personally reviewed the patient's PFTs and normal lung volumes and diffusion capacity. Unable to perform spirometry  Labs:  Immunization status: Immunization History  Administered Date(s) Administered   Influenza, High Dose Seasonal PF 11/09/2011, 11/11/2013, 10/10/2020   Influenza, Quadrivalent, Recombinant, Inj, Pf 11/08/2016,  12/10/2018   Influenza-Unspecified 11/05/2012   Pneumococcal Polysaccharide-23 02/17/2007   Td 07/30/2004, 02/16/2016   Zoster Recombinat (Shingrix) 03/21/2018    External Records Personally Reviewed:   Assessment:  Concern for DIPNECH Abnormal CT Chest Shortness of breath  Plan/Recommendations:  Discussed in ILD conference. Suspicious for DIPNECH. Will obtain high res ct scan.  Will discuss with radiology utility of dotatate scan. I have reached out to nuclear medicine.  Would like to be more confident of diagnosis prior to starting somatostatin analog. We discussed this as a possible therapy.   I spent 30 minutes on 02/16/2021 in care of this patient including face to face time and non-face to face time spent charting, review of outside records, and coordination of care.  Return to Care: I will contact her with follow up after her CT scan on next steps.    Lenice Llamas, MD Pulmonary and [place]

## 2021-02-17 ENCOUNTER — Ambulatory Visit: Payer: Medicare Other | Admitting: Cardiology

## 2021-02-17 ENCOUNTER — Telehealth: Payer: Self-pay | Admitting: Internal Medicine

## 2021-02-17 ENCOUNTER — Encounter: Payer: Self-pay | Admitting: Cardiology

## 2021-02-17 VITALS — BP 132/77 | HR 89 | Temp 96.9°F | Ht 61.0 in | Wt 139.8 lb

## 2021-02-17 DIAGNOSIS — Z9889 Other specified postprocedural states: Secondary | ICD-10-CM

## 2021-02-17 DIAGNOSIS — E782 Mixed hyperlipidemia: Secondary | ICD-10-CM

## 2021-02-17 DIAGNOSIS — I10 Essential (primary) hypertension: Secondary | ICD-10-CM

## 2021-02-17 DIAGNOSIS — I6523 Occlusion and stenosis of bilateral carotid arteries: Secondary | ICD-10-CM

## 2021-02-17 DIAGNOSIS — Z951 Presence of aortocoronary bypass graft: Secondary | ICD-10-CM | POA: Diagnosis not present

## 2021-02-17 DIAGNOSIS — Z95828 Presence of other vascular implants and grafts: Secondary | ICD-10-CM | POA: Diagnosis not present

## 2021-02-17 DIAGNOSIS — I2510 Atherosclerotic heart disease of native coronary artery without angina pectoris: Secondary | ICD-10-CM | POA: Diagnosis not present

## 2021-02-17 NOTE — Progress Notes (Signed)
[place] Date of Birth: 23-Oct-1933 MRN: [id number] Primary Care Provider:Collins, Hinton Dyer, DO  Former cardiology providers: Jeri Lager, MSN, APRN, FNP-C  Primary cardiology:Zaiyden Strozier Azalia B[facility] (established care 05/20/2019)  Date: 02/17/21 Last Office Visit: 07/07/2020  Chief Complaint  Patient presents with   Coronary Artery Disease    HPI  [name] is a 88 y.o.  female who presents to the office with a chief complaint of " 67-monthfollow-up for CAD management." Patient's past medical history and cardiovascular risk factors include: Established coronary artery disease without angina pectoris, status post three-vessel CABG in 2007, mixed hyperlipidemia, carotid artery disease with history of right carotid endarterectomy and carotid stent, postmenopausal female, advanced age.  Patient presents for 686-monthollow-up given her history of CAD and three-vessel coronary bypass surgery.  Over the last 6 months patient is doing well from a cardiovascular standpoint.  No hospitalizations or urgent care visits for cardiovascular symptoms.  She currently has a productive cough and shortness of breath with exertion is relatively stable and at baseline.  She is currently being seen by pulmonary medicine last note from Dr. DeShearon Stallseviewed.  She scheduled for high-resolution CT scan.  She also follows up with Dr. FiOneida Alariven her history of carotid disease status post endarterectomy and stenting.  FUNCTIONAL STATUS: No structured exercise program or daily routine.  But she has 14 steps in her house that she goes up and down approximately 6-7 times per day without any effort related symptoms.  ALLERGIES: Allergies  Allergen Reactions   Hydrocodone Anaphylaxis, Shortness Of Breath, Itching and Rash    Rash-itching-tongue swelled   Fenofibrate Other (See Comments)    Caused every joint to become stif   Atorvastatin     Other reaction(s): Unknown    Hydrocodone-Acetaminophen Other (See Comments)   Niacin And Related     Other reaction(s): Unknown   Latex Rash and Other (See Comments)    "Blisters in mouth"   Oxycodone Itching, Swelling and Rash     MEDICATION LIST PRIOR TO VISIT: Current Outpatient Medications on File Prior to Visit  Medication Sig Dispense Refill   acetaminophen (TYLENOL) 500 MG tablet Take 500 mg by mouth every 6 (six) hours as needed for moderate pain or headache.     aspirin 81 MG tablet Take 81 mg by mouth daily.     calcium carbonate (OSCAL) 1500 (600 Ca) MG TABS tablet Take 600 mg of elemental calcium by mouth 2 (two) times daily with a meal.     Cholecalciferol (VITAMIN D) 50 MCG (2000 UT) tablet Take 2,000 Units by mouth in the morning and at bedtime.     denosumab (PROLIA) 60 MG/ML SOSY injection Inject 60 mg into the skin every 6 (six) months.      ezetimibe (ZETIA) 10 MG tablet Take 10 mg by mouth every other day. Alternates zetia one day and crestor the next     losartan-hydrochlorothiazide (HYZAAR) 50-12.5 MG tablet Take 1 tablet by mouth daily.     metoprolol tartrate (LOPRESSOR) 25 MG tablet Take 1 tablet (25 mg total) by mouth 2 (two) times daily.     omeprazole (PRILOSEC) 40 MG capsule Take 1 capsule (40 mg total) by mouth daily. 30 capsule 3   rosuvastatin (CRESTOR) 20 MG tablet Take 20 mg by mouth every other day. Alternates zetia one day and crestor the next     tobramycin-dexamethasone (TOBRADEX) ophthalmic solution Place 1 drop into the left eye 4 (four) times daily. For one  week 5 mL 0   No current facility-administered medications on file prior to visit.    PAST MEDICAL HISTORY: Past Medical History:  Diagnosis Date   Arthritis    Chronic cough    x 40 yrs - unknown orgin- checked out by Dr Velora Heckler   Coronary artery disease    Diverticulitis    Hypercholesteremia    Hypertension    Internal carotid artery stenosis 08/2012   Normal cardiac stress test    @ Dr Irven Shelling office    Peripheral vascular disease [facility])    Thyroid disease     PAST SURGICAL HISTORY: Past Surgical History:  Procedure Laterality Date   25 GAUGE PARS PLANA VITRECTOMY WITH 20 GAUGE MVR PORT FOR MACULAR HOLE Left 10/07/2018   Procedure: 25 GAUGE PARS PLANA VITRECTOMY WITH 20 GAUGE MVR PORT;  Surgeon: [name], MD;  Location: [place];  Service: Ophthalmology;  Laterality: Left;   ABDOMINAL HYSTERECTOMY     APPENDECTOMY     CARDIAC CATHETERIZATION  2007   CAROTID ANGIOGRAM N/A 04/01/2013   Procedure: CAROTID Cyril Loosen;  Surgeon: Elam Dutch, MD;  Location[facility] C[facility] LAB;  Service: Cardiovascular;  Laterality: N/A;   CAROTID ENDARTERECTOMY Right August 25, 2012   cea   CAROTID STENT INSERTION Right 04/22/2013   Procedure: CAROTID STENT INSERTION;  Surgeon: Elam Dutch, MD;  Location: [facility] CATH LAB;  Service: Cardiovascular;  Laterality: Right;   COLONOSCOPY     CORONARY ARTERY BYPASS GRAFT     ENDARTERECTOMY Right 08/25/2012   Procedure: ENDARTERECTOMY CAROTID;  Surgeon: Elam Dutch, MD;  Location: [place];  Service: Vascular;  Laterality: Right;   EYE SURGERY Bilateral    both cataracts   FRACTURE SURGERY Left Jun 14, 2011   [place]   GAS INSERTION Left 10/07/2018   Procedure: GAS INJECTION LEFT EYE;  Surgeon: [name], MD;  Location: [place];  Service: Ophthalmology;  Laterality: Left;   MEMBRANE PEEL Left 10/07/2018   Procedure: INTERNAL LIMITING MEMBRANE PEEL;  Surgeon: [name], MD;  Location: [place];  Service: Ophthalmology;  Laterality: Left;   ORIF ANKLE FRACTURE  06/14/2011   Procedure: OPEN REDUCTION INTERNAL FIXATION (ORIF) ANKLE FRACTURE;  Surgeon: Ninetta Lights, MD;  Location: [place];  Service: Orthopedics;  Laterality: Left;  left ankle fracture open treatment trimalleolar ankle includes internal fixation WITHOUT fixation of posterior lip   PHOTOCOAGULATION WITH LASER Left 10/07/2018   Procedure: PHOTOCOAGULATION WITH LASER;  Surgeon: [name], MD;  Location: [place];  Service: Ophthalmology;  Laterality: Left;   SERUM PATCH Left 10/07/2018   Procedure: LEFT EYE SERUM PATCH FOR MACULAR HOLE;  Surgeon: [name], MD;  Location: [place];  Service: Ophthalmology;  Laterality: Left;   SHOULDER ARTHROSCOPY WITH SUBACROMIAL DECOMPRESSION, ROTATOR CUFF REPAIR AND BICEP TENDON REPAIR Left 06/05/2012   Procedure: LEFT SHOULDER ARTHROSCOPY WITH SUBACROMIAL DECOMPRESSION, PARTIAL ACROMIOPLASTY WITH CORACOACROMIAL RELEASE, DISTAL CLAVICULECTOMY, WITH ROTATOR CUFF REPAIR AND BICEP TENODESIS;  Surgeon: Ninetta Lights, MD;  Location: [place];  Service: Orthopedics;  Laterality: Left;   STRABISMUS SURGERY Left 06/24/2020   Procedure: REPAIR STRABISMUS LEFT EYE;  Surgeon: Lamonte Sakai, MD;  Location: [place];  Service: Ophthalmology;  Laterality: Left;    FAMILY HISTORY: The patient's family history includes Cancer in her father and sister; Diabetes in her sister; Heart disease in her brother and sister; Hyperlipidemia in her sister.   SOCIAL HISTORY:  The patient  reports that she has never smoked. She has never used smokeless tobacco. She reports current alcohol use of about 7.0 standard drinks per week. She reports that she does not use drugs.  Review of Systems  Constitutional: Negative for chills and fever.  HENT:  Negative for ear discharge, ear pain and nosebleeds.   Eyes:  Negative for blurred vision and discharge.  Cardiovascular:  Negative for chest pain, claudication, dyspnea on exertion, leg swelling, near-syncope, orthopnea, palpitations, paroxysmal nocturnal dyspnea and syncope.  Respiratory:  Negative for cough and shortness of breath.   Endocrine: Negative for polydipsia, polyphagia and polyuria.  Hematologic/Lymphatic: Negative for bleeding problem.  Skin:  Negative for flushing and nail changes.  Musculoskeletal:  Negative for muscle cramps, muscle weakness and myalgias.  Gastrointestinal:  Negative for  abdominal pain, dysphagia, hematemesis, hematochezia, melena, nausea and vomiting.  Neurological:  Negative for dizziness, focal weakness and light-headedness.   PHYSICAL EXAM: Vitals with BMI 02/17/2021 02/16/2021 01/10/2021  Height _0  _1  _2   Weight 139 lbs 13 oz 138 lbs 138 lbs 6 oz  BMI 26.43 09.73 53.29  Systolic 924 268 341  Diastolic 77 82 82  Pulse 89 79 79    CONSTITUTIONAL: Well-developed and well-nourished. No acute distress.  SKIN: Skin is warm and dry. No rash noted. No cyanosis. No pallor. No jaundice HEAD: Normocephalic and atraumatic.  EYES: No scleral icterus MOUTH/THROAT: Moist oral membranes.  NECK: No JVD present. No thyromegaly noted.  Right carotid endarterectomy site is clean dry and intact LYMPHATIC: No visible cervical adenopathy.  CHEST Normal respiratory effort. No intercostal retractions.  Sternotomy site is well-healed LUNGS: Clear to auscultation bilaterally.  No stridor. No wheezes. No rales.  CARDIOVASCULAR: Regular rate and rhythm, positive S1-S2, no murmurs rubs or gallops appreciated. ABDOMINAL: No apparent ascites.  EXTREMITIES: No peripheral edema  HEMATOLOGIC: No significant bruising NEUROLOGIC: Oriented to person, place, and time. Nonfocal. Normal muscle tone.  PSYCHIATRIC: Normal mood and affect. Normal behavior. Cooperative  CARDIAC DATABASE: EKG: 02/17/2021: Normal sinus rhythm, 80 bpm, normal axis, without underlying ischemia or injury pattern, occasional PACs.   Echocardiogram: 03/22/2018: LVEF 55-60%, severe basal septal hypertrophy mild MAC, aortic valve sclerosis, intra-atrial septum appears lipomatous.  Stress Testing:  08/18/2012: Myoview stress test reported normal isotope uptake in both rest and stress images.  No evidence of ischemia or scar.  LVEF by gated SPECT 88%.  Carotid Duplex 08/2017: Right Carotid: There is no evidence of stenosis in the right ICA. Patent stent without evidence of restenosis.  Left Carotid:  Velocities in the left ICA are consistent with a 1-39% stenosis. The ECA appears >50% stenosed. Vertebrals:  Bilateral vertebral arteries demonstrate antegrade flow.  Subclavians: Normal flow hemodynamics were seen in bilateral subclavian arteries.   LABORATORY DATA: CBC Latest Ref Rng & Units 01/06/2021 10/07/2018 03/23/2018  WBC 4.0 - 10.5 K/uL 7.0 5.1 6.0  Hemoglobin 12.0 - 15.0 g/dL 14.5 13.6 11.9(L)  Hematocrit 36.0 - 46.0 % 43.9 41.1 37.4  Platelets 150 - 400 K/uL 356 222 276    CMP Latest Ref Rng & Units 01/06/2021 01/06/2021 06/24/2020  Glucose 70 - 99 mg/dL 110(H) 107(H) 94  BUN 8 - 23 mg/dL _3 Creatinine 0.44 - 1.00 mg/dL 0.73 0.74 0.87  Sodium 135 - 145 mmol/L 134(L) 132(L) 135  Potassium 3.5 - 5.1 mmol/L 4.7 6.0(H) 3.3(L)  Chloride 98 - 111 mmol/L 94(L) 95(L) 99  CO2 22 - 32 mmol/L _4 Calcium 8.9 - 10.3  mg/dL 9.5 8.9 9.7  Total Protein 6.5 - 8.1 g/dL 7.5 - -  Total Bilirubin 0.3 - 1.2 mg/dL 0.7 - -  Alkaline Phos 38 - 126 U/L 61 - -  AST 15 - 41 U/L 59(H) - -  ALT 0 - 44 U/L 62(H) - -    External Labs: Collected: 09/08/2020 provided by primary care Hemoglobin 14.8 g/dL, hematocrit 43.4% Sodium 142, potassium 4.8, bicarb 33, creatinine 0.78, BUN 18 hemoglobin A1c 5.3 Total cholesterol 179, triglycerides 133, HDL 50, LDL 102, non-HDL 129  FINAL MEDICATION LIST END OF ENCOUNTER: No orders of the defined types were placed in this encounter.   There are no discontinued medications.    Current Outpatient Medications:    acetaminophen (TYLENOL) 500 MG tablet, Take 500 mg by mouth every 6 (six) hours as needed for moderate pain or headache., Disp: , Rfl:    aspirin 81 MG tablet, Take 81 mg by mouth daily., Disp: , Rfl:    calcium carbonate (OSCAL) 1500 (600 Ca) MG TABS tablet, Take 600 mg of elemental calcium by mouth 2 (two) times daily with a meal., Disp: , Rfl:    Cholecalciferol (VITAMIN D) 50 MCG (2000 UT) tablet, Take 2,000 Units by mouth in the morning and  at bedtime., Disp: , Rfl:    denosumab (PROLIA) 60 MG/ML SOSY injection, Inject 60 mg into the skin every 6 (six) months. , Disp: , Rfl:    ezetimibe (ZETIA) 10 MG tablet, Take 10 mg by mouth every other day. Alternates zetia one day and crestor the next, Disp: , Rfl:    losartan-hydrochlorothiazide (HYZAAR) 50-12.5 MG tablet, Take 1 tablet by mouth daily., Disp: , Rfl:    metoprolol tartrate (LOPRESSOR) 25 MG tablet, Take 1 tablet (25 mg total) by mouth 2 (two) times daily., Disp: , Rfl:    omeprazole (PRILOSEC) 40 MG capsule, Take 1 capsule (40 mg total) by mouth daily., Disp: 30 capsule, Rfl: 3   rosuvastatin (CRESTOR) 20 MG tablet, Take 20 mg by mouth every other day. Alternates zetia one day and crestor the next, Disp: , Rfl:    tobramycin-dexamethasone (TOBRADEX) ophthalmic solution, Place 1 drop into the left eye 4 (four) times daily. For one week, Disp: 5 mL, Rfl: 0  IMPRESSION:    ICD-10-CM   1. Coronary artery disease involving native coronary artery of native heart without angina pectoris  I25.10 EKG 12-Lead    PCV ECHOCARDIOGRAM COMPLETE    2. Hx of CABG  Z95.1 PCV ECHOCARDIOGRAM COMPLETE    3. Essential hypertension  I10     4. Carotid stenosis, bilateral  I65.23     5. History of right-sided carotid endarterectomy  Z98.890     6. History of right carotid artery stent placement  Z98.890    Z95.828     7. Mixed hyperlipidemia  E78.2        RECOMMENDATIONS: [name] is a 88 y.o. female whose past medical history and cardiovascular risk factors include: Established coronary artery disease without angina pectoris, status post three-vessel CABG in 2007, mixed hyperlipidemia, carotid artery disease with history of right carotid endarterectomy and carotid stent, postmenopausal female, advanced age.  Coronary artery disease involving native coronary artery of native heart without angina pectoris / Hx of CABG Denies angina pectoris. Medications  reconciled. EKG: Normal sinus without underlying injury pattern. Echo: LVEF 55 to 60% (03/2018) Stress test: Low risk study (08/2012) Patient continues to have shortness of breath with effort related activities which is  chronic and stable.  Since her last ischemic evaluation was greater than 5 years recommended stress testing to reevaluate for obstructive CAD given her history.  However, patient would like to hold off on additional testing at this time and possibly consider it at the next office visit.  She is willing to proceed with echocardiogram to reevaluate LVEF prior to the next office visit.  Essential hypertension Office blood pressures are well controlled. Medications reconciled.  Reemphasized importance of low-salt diet.  Carotid stenosis, bilateral / History of right-sided carotid endarterectomy / History of right carotid artery stent Follows with Dr. Oneida Alar Continue aspirin, Zetia, rosuvastatin  Mixed hyperlipidemia Currently on Zetia, rosuvastatin.   She denies myalgia or other side effects. Currently managed by primary care provider.  Orders Placed This Encounter  Procedures   EKG 12-Lead   PCV ECHOCARDIOGRAM COMPLETE   --Continue cardiac medications as reconciled in final medication list. --Return in about 1 year (around 02/17/2022) for Follow up, CAD. Or sooner if needed. --Continue follow-up with your primary care physician regarding the management of your other chronic comorbid conditions.  Patient's questions and concerns were addressed to her satisfaction. She voices understanding of the instructions provided during this encounter.   This note was created using a voice recognition software as a result there may be grammatical errors inadvertently enclosed that do not reflect the nature of this encounter. Every attempt is made to correct such errors.  Total time spent: 22 minutes.  Rex Kras, [place], [facility]  Pager: [phone] Office: [phone]

## 2021-02-17 NOTE — Telephone Encounter (Signed)
Called and spoke with patient. She stated that she received a call from our office but did not catch the name on the VM. I reviewed her chart and it looks like it may have been one of the PCCs calling about her CT scan on 02/28/21. I provided her with the appt information, she verbalized understanding.   Nothing further needed at time of call.

## 2021-02-28 ENCOUNTER — Other Ambulatory Visit: Payer: Self-pay

## 2021-02-28 ENCOUNTER — Ambulatory Visit (HOSPITAL_COMMUNITY)
Admission: RE | Admit: 2021-02-28 | Discharge: 2021-02-28 | Disposition: A | Discharge status: Home / Self Care | Payer: Medicare Other | Source: Ambulatory Visit | Attending: Internal Medicine | Admitting: Internal Medicine

## 2021-02-28 DIAGNOSIS — J984 Other disorders of lung: Secondary | ICD-10-CM | POA: Diagnosis not present

## 2021-02-28 DIAGNOSIS — I700 Atherosclerosis of aorta: Secondary | ICD-10-CM | POA: Diagnosis not present

## 2021-02-28 DIAGNOSIS — R059 Cough, unspecified: Secondary | ICD-10-CM | POA: Diagnosis not present

## 2021-02-28 DIAGNOSIS — I898 Other specified noninfective disorders of lymphatic vessels and lymph nodes: Secondary | ICD-10-CM | POA: Diagnosis not present

## 2021-02-28 DIAGNOSIS — R918 Other nonspecific abnormal finding of lung field: Secondary | ICD-10-CM | POA: Diagnosis not present

## 2021-03-01 ENCOUNTER — Encounter (INDEPENDENT_AMBULATORY_CARE_PROVIDER_SITE_OTHER): Payer: Medicare Other | Admitting: Ophthalmology

## 2021-03-01 DIAGNOSIS — H353111 Nonexudative age-related macular degeneration, right eye, early dry stage: Secondary | ICD-10-CM | POA: Diagnosis not present

## 2021-03-01 DIAGNOSIS — I10 Essential (primary) hypertension: Secondary | ICD-10-CM

## 2021-03-01 DIAGNOSIS — H43811 Vitreous degeneration, right eye: Secondary | ICD-10-CM | POA: Diagnosis not present

## 2021-03-01 DIAGNOSIS — H353122 Nonexudative age-related macular degeneration, left eye, intermediate dry stage: Secondary | ICD-10-CM

## 2021-03-01 DIAGNOSIS — H59032 Cystoid macular edema following cataract surgery, left eye: Secondary | ICD-10-CM | POA: Diagnosis not present

## 2021-03-01 DIAGNOSIS — H35033 Hypertensive retinopathy, bilateral: Secondary | ICD-10-CM | POA: Diagnosis not present

## 2021-03-13 DIAGNOSIS — Z20822 Contact with and (suspected) exposure to covid-19: Secondary | ICD-10-CM | POA: Diagnosis not present

## 2021-03-15 DIAGNOSIS — E785 Hyperlipidemia, unspecified: Secondary | ICD-10-CM | POA: Diagnosis not present

## 2021-03-15 DIAGNOSIS — I6529 Occlusion and stenosis of unspecified carotid artery: Secondary | ICD-10-CM | POA: Diagnosis not present

## 2021-03-15 DIAGNOSIS — Z79899 Other long term (current) drug therapy: Secondary | ICD-10-CM | POA: Diagnosis not present

## 2021-03-22 DIAGNOSIS — M810 Age-related osteoporosis without current pathological fracture: Secondary | ICD-10-CM | POA: Diagnosis not present

## 2021-03-22 DIAGNOSIS — R7309 Other abnormal glucose: Secondary | ICD-10-CM | POA: Diagnosis not present

## 2021-03-22 DIAGNOSIS — R918 Other nonspecific abnormal finding of lung field: Secondary | ICD-10-CM | POA: Diagnosis not present

## 2021-03-22 DIAGNOSIS — Z79899 Other long term (current) drug therapy: Secondary | ICD-10-CM | POA: Diagnosis not present

## 2021-03-22 DIAGNOSIS — R053 Chronic cough: Secondary | ICD-10-CM | POA: Diagnosis not present

## 2021-03-22 DIAGNOSIS — I6529 Occlusion and stenosis of unspecified carotid artery: Secondary | ICD-10-CM | POA: Diagnosis not present

## 2021-03-22 DIAGNOSIS — E559 Vitamin D deficiency, unspecified: Secondary | ICD-10-CM | POA: Diagnosis not present

## 2021-03-22 DIAGNOSIS — Z951 Presence of aortocoronary bypass graft: Secondary | ICD-10-CM | POA: Diagnosis not present

## 2021-03-22 DIAGNOSIS — E785 Hyperlipidemia, unspecified: Secondary | ICD-10-CM | POA: Diagnosis not present

## 2021-03-31 DIAGNOSIS — Z20822 Contact with and (suspected) exposure to covid-19: Secondary | ICD-10-CM | POA: Diagnosis not present

## 2021-04-12 DIAGNOSIS — H43813 Vitreous degeneration, bilateral: Secondary | ICD-10-CM | POA: Diagnosis not present

## 2021-04-12 DIAGNOSIS — H40013 Open angle with borderline findings, low risk, bilateral: Secondary | ICD-10-CM | POA: Diagnosis not present

## 2021-04-12 DIAGNOSIS — Z961 Presence of intraocular lens: Secondary | ICD-10-CM | POA: Diagnosis not present

## 2021-04-12 DIAGNOSIS — H35342 Macular cyst, hole, or pseudohole, left eye: Secondary | ICD-10-CM | POA: Diagnosis not present

## 2021-04-26 ENCOUNTER — Encounter (INDEPENDENT_AMBULATORY_CARE_PROVIDER_SITE_OTHER): Payer: Medicare Other | Admitting: Ophthalmology

## 2021-04-26 ENCOUNTER — Other Ambulatory Visit: Payer: Self-pay | Admitting: Sports Medicine

## 2021-04-26 DIAGNOSIS — E559 Vitamin D deficiency, unspecified: Secondary | ICD-10-CM | POA: Diagnosis not present

## 2021-04-26 DIAGNOSIS — M810 Age-related osteoporosis without current pathological fracture: Secondary | ICD-10-CM | POA: Diagnosis not present

## 2021-04-27 ENCOUNTER — Other Ambulatory Visit: Payer: Self-pay | Admitting: Sports Medicine

## 2021-04-27 DIAGNOSIS — Z1231 Encounter for screening mammogram for malignant neoplasm of breast: Secondary | ICD-10-CM

## 2021-05-01 DIAGNOSIS — Z20822 Contact with and (suspected) exposure to covid-19: Secondary | ICD-10-CM | POA: Diagnosis not present

## 2021-05-02 DIAGNOSIS — Z20828 Contact with and (suspected) exposure to other viral communicable diseases: Secondary | ICD-10-CM | POA: Diagnosis not present

## 2021-05-02 DIAGNOSIS — Z1152 Encounter for screening for COVID-19: Secondary | ICD-10-CM | POA: Diagnosis not present

## 2021-05-04 DIAGNOSIS — Z20822 Contact with and (suspected) exposure to covid-19: Secondary | ICD-10-CM | POA: Diagnosis not present

## 2021-05-10 ENCOUNTER — Encounter (INDEPENDENT_AMBULATORY_CARE_PROVIDER_SITE_OTHER): Payer: Medicare Other | Admitting: Ophthalmology

## 2021-05-10 DIAGNOSIS — H59032 Cystoid macular edema following cataract surgery, left eye: Secondary | ICD-10-CM | POA: Diagnosis not present

## 2021-05-10 DIAGNOSIS — H35033 Hypertensive retinopathy, bilateral: Secondary | ICD-10-CM | POA: Diagnosis not present

## 2021-05-10 DIAGNOSIS — H43811 Vitreous degeneration, right eye: Secondary | ICD-10-CM

## 2021-05-10 DIAGNOSIS — I10 Essential (primary) hypertension: Secondary | ICD-10-CM | POA: Diagnosis not present

## 2021-05-15 DIAGNOSIS — Z20822 Contact with and (suspected) exposure to covid-19: Secondary | ICD-10-CM | POA: Diagnosis not present

## 2021-05-22 DIAGNOSIS — Z20822 Contact with and (suspected) exposure to covid-19: Secondary | ICD-10-CM | POA: Diagnosis not present

## 2021-05-26 DIAGNOSIS — Z20828 Contact with and (suspected) exposure to other viral communicable diseases: Secondary | ICD-10-CM | POA: Diagnosis not present

## 2021-05-31 ENCOUNTER — Encounter: Payer: Self-pay | Admitting: Internal Medicine

## 2021-05-31 ENCOUNTER — Ambulatory Visit (INDEPENDENT_AMBULATORY_CARE_PROVIDER_SITE_OTHER): Payer: Medicare Other | Admitting: Internal Medicine

## 2021-05-31 VITALS — BP 112/60 | HR 70 | Temp 97.9°F | Ht 62.0 in | Wt 137.8 lb

## 2021-05-31 DIAGNOSIS — R053 Chronic cough: Secondary | ICD-10-CM | POA: Diagnosis not present

## 2021-05-31 DIAGNOSIS — R490 Dysphonia: Secondary | ICD-10-CM

## 2021-05-31 DIAGNOSIS — R9389 Abnormal findings on diagnostic imaging of other specified body structures: Secondary | ICD-10-CM

## 2021-05-31 NOTE — Patient Instructions (Signed)
Please schedule follow up scheduled with myself in 3 months.  If my schedule is not open yet, we will contact you with a reminder closer to that time. Please call [phone] if you haven't heard from [place] a month before.  ? ?Before your next visit I would like you to have: ?Follow up with ENT doctor about cough and hoarse voice.  ? ?We can follow up after you see them. If the cough is still an issue we can discuss bronchoscopy again.  ? ?

## 2021-05-31 NOTE — Progress Notes (Signed)
? ?      ?[name]    [id number]    [birth year] ? ?Primary Care Physician:Collins, Hinton Dyer, DO ?Date of Appointment: 05/31/2021 ?Established Patient Visit ? ?Chief complaint:   ?Chief Complaint  ?Patient presents with  ? Follow-up  ?  F/u after CT-cough-clear  ? ? ? ?HPI: ?[name] is a 88 y.o. woman with shortness of breath and chronic cough with abnormal CT Chest.  ? ?Interval Updates: ?I have reviewed her scan in MDD ILD conference. Discussed with Dr. Oletta Lamas who felt dotate scan would be of limited utility given diffuse disease.  ? ?Adrienne is a little hesitant to undergo bronchoscopy because of her age.  ?She is also more concerned that her cough is from drainage from rhinitis and ongoing voice hoarseness. She has never seen an ENT.  ? ? ?I have reviewed the patient's family social and past medical history and updated as appropriate.  ? ?Past Medical History:  ?Diagnosis Date  ? Arthritis   ? Chronic cough   ? x 40 yrs - unknown orgin- checked out by Dr Velora Heckler  ? Coronary artery disease   ? Diverticulitis   ? Hypercholesteremia   ? Hypertension   ? Internal carotid artery stenosis 08/2012  ? Normal cardiac stress test   ? @ Dr Irven Shelling office  ? Peripheral vascular disease ([place])   ? Thyroid disease   ? ? ?Past Surgical History:  ?Procedure Laterality Date  ? 25 GAUGE PARS PLANA VITRECTOMY WITH 20 GAUGE MVR PORT FOR MACULAR HOLE Left 10/07/2018  ? Procedure: 25 GAUGE PARS PLANA VITRECTOMY WITH 20 GAUGE MVR PORT;  Surgeon: [name], MD;  Location: [place];  Service: Ophthalmology;  Laterality: Left;  ? ABDOMINAL HYSTERECTOMY    ? APPENDECTOMY    ? CARDIAC CATHETERIZATION  2007  ? CAROTID ANGIOGRAM N/A 04/01/2013  ? Procedure: CAROTID ANGIOGRAM;  Surgeon: Elam Dutch, MD;  Location: [facility] CATH LAB;  Service: Cardiovascular;  Laterality: N/A;  ? CAROTID ENDARTERECTOMY Right August 25, 2012  ? cea  ? CAROTID STENT INSERTION Right 04/22/2013  ? Procedure: CAROTID STENT  INSERTION;  Surgeon: Elam Dutch, MD;  Location: [facility] CATH LAB;  Service: Cardiovascular;  Laterality: Right;  ? COLONOSCOPY    ? CORONARY ARTERY BYPASS GRAFT    ? ENDARTERECTOMY Right 08/25/2012  ? Procedure: ENDARTERECTOMY CAROTID;  Surgeon: Elam Dutch, MD;  [facility] OR;  Service: Vascular;  Laterality: Right;  ? EYE SURGERY Bilateral   ? both cataracts  ? FRACTURE SURGERY Left Jun 14, 2011  ? Ankle  ? GAS INSERTION Left 10/07/2018  ? Procedure: GAS INJECTION LEFT EYE;  Surgeon: [name], MD;  Location: [place];  Service: Ophthalmology;  Laterality: Left;  ? MEMBRANE PEEL Left 10/07/2018  ? Procedure: INTERNAL LIMITING MEMBRANE PEEL;  Surgeon: [name], MD;  Location: [place];  Service: Ophthalmology;  Laterality: Left;  ? ORIF ANKLE FRACTURE  06/14/2011  ? Procedure: OPEN REDUCTION INTERNAL FIXATION (ORIF) ANKLE FRACTURE;  Surgeon: Ninetta Lights, MD;  Location: [place];  Service: Orthopedics;  Laterality: Left;  left ankle fracture open treatment trimalleolar ankle includes internal fixation WITHOUT fixation of posterior lip  ? PHOTOCOAGULATION WITH LASER Left 10/07/2018  ? Procedure: PHOTOCOAGULATION WITH LASER;  Surgeon: [name], MD;  Location: [place];  Service: Ophthalmology;  Laterality: Left;  ? SERUM PATCH Left 10/07/2018  ? Procedure: LEFT EYE SERUM PATCH FOR MACULAR HOLE;  Surgeon: Zigmund Daniel,  Chrystie Nose, MD;  Location: [place];  Service: Ophthalmology;  Laterality: Left;  ? SHOULDER ARTHROSCOPY WITH SUBACROMIAL DECOMPRESSION, ROTATOR CUFF REPAIR AND BICEP TENDON REPAIR Left 06/05/2012  ? Procedure: LEFT SHOULDER ARTHROSCOPY WITH SUBACROMIAL DECOMPRESSION, PARTIAL ACROMIOPLASTY WITH CORACOACROMIAL RELEASE, DISTAL CLAVICULECTOMY, WITH ROTATOR CUFF REPAIR AND BICEP TENODESIS;  Surgeon: Ninetta Lights, MD;  Location: [place];  Service: Orthopedics;  Laterality: Left;  ? STRABISMUS SURGERY Left 06/24/2020  ? Procedure: REPAIR STRABISMUS LEFT EYE;  Surgeon:  Lamonte Sakai, MD;  Location: [place];  Service: Ophthalmology;  Laterality: Left;  ? ? ?Family History  ?Problem Relation Age of Onset  ? Cancer Father   ?     Brain   ? Diabetes Sister   ? Heart disease Sister   ?     after age 56  ? Hyperlipidemia Sister   ? Heart disease Brother   ?     After age 48  ? Cancer Sister   ? ? ?Social History  ? ?Occupational History  ? Not on file  ?Tobacco Use  ? Smoking status: Never  ? Smokeless tobacco: Never  ?Vaping Use  ? Vaping Use: Never used  ?Substance and Sexual Activity  ? Alcohol use: Yes  ?  Alcohol/week: 7.0 standard drinks  ?  Types: 7 Glasses of wine per week  ?  Comment: red wine  ? Drug use: No  ? Sexual activity: Never  ?  Birth control/protection: Post-menopausal  ? ? ? ?Physical Exam: ?Blood pressure 112/60, pulse 70, temperature 97.9 ?F (36.6 ?C), temperature source Temporal, height '5\' 2"'$  (1.575 m), weight 137 lb 12.8 oz (62.5 kg), SpO2 92 %. ? ?Gen:      No distress ?ENT:  hoarse voice ?Lungs:    ctab no wheezes or crackles ?CV:         RRR no mrg ? ? ?Data Reviewed: ?Imaging: ?I have personally reviewed the Ct Chest PE study which shows air trapping and multiple pulmonary nodules.  ? ?PFTs: ? ? ?  Latest Ref Rng & Units 02/16/2021  ? 11:59 AM  ?PFT Results  ?DLCO uncorrected ml/min/mmHg 12.94    ?DLCO UNC% % 77    ?DLCO corrected ml/min/mmHg 12.53    ?DLCO COR %Predicted % 75    ?DLVA Predicted % 111    ?TLC L 3.62    ?TLC % Predicted % 78    ?RV % Predicted % 105    ? ?I have personally reviewed the patient's PFTs and normal lung volumes and diffusion capacity. Unable to perform spirometry ? ?Labs: ? ?Immunization status: ?Immunization History  ?Administered Date(s) Administered  ? Influenza, High Dose Seasonal PF 11/09/2011, 11/11/2013, 10/10/2020  ? Influenza, Quadrivalent, Recombinant, Inj, Pf 11/08/2016, 12/10/2018  ? Influenza-Unspecified 11/05/2012  ? Pneumococcal Polysaccharide-23 02/17/2007  ? Td 07/30/2004, 02/16/2016  ? Zoster Recombinat (Shingrix)  03/21/2018  ? ? ?External Records Personally Reviewed:  ? ?Assessment:  ?Abnormal CT Chest  with concern for DIPNECH ?Shortness of breath ?Chronic cough.  ? ?Plan/Recommendations: ? ?High res ct scan reviewed. Still consistent with DIPNECH. Next step would be biopsy before a trial of steroids or somatostatin analog. She would like to see ENT first and try to get rhinitis under control first. Then consider biopsy if no improvement. Will refer to ENT.  ? ?I spent 30 minutes in the care of this patient today including pre-charting, chart review, review of results, face-to-face care, coordination of care and communication with consultants etc.). ? ? ?Return  to Care: ?Return in about 3 months (around 08/30/2021). ? ? ? ?Lenice Llamas, MD ?Pulmonary and Critical Care Medicine ?[place] ?[phone] ? ? ? ? ? ?

## 2021-06-16 DIAGNOSIS — D485 Neoplasm of uncertain behavior of skin: Secondary | ICD-10-CM | POA: Diagnosis not present

## 2021-06-16 DIAGNOSIS — C44612 Basal cell carcinoma of skin of right upper limb, including shoulder: Secondary | ICD-10-CM | POA: Diagnosis not present

## 2021-06-21 DIAGNOSIS — Z01812 Encounter for preprocedural laboratory examination: Secondary | ICD-10-CM | POA: Diagnosis not present

## 2021-07-27 DIAGNOSIS — L989 Disorder of the skin and subcutaneous tissue, unspecified: Secondary | ICD-10-CM | POA: Diagnosis not present

## 2021-07-27 DIAGNOSIS — C44612 Basal cell carcinoma of skin of right upper limb, including shoulder: Secondary | ICD-10-CM | POA: Diagnosis not present

## 2021-08-04 DIAGNOSIS — I2510 Atherosclerotic heart disease of native coronary artery without angina pectoris: Secondary | ICD-10-CM | POA: Diagnosis not present

## 2021-08-04 DIAGNOSIS — M810 Age-related osteoporosis without current pathological fracture: Secondary | ICD-10-CM | POA: Diagnosis not present

## 2021-08-04 DIAGNOSIS — E785 Hyperlipidemia, unspecified: Secondary | ICD-10-CM | POA: Diagnosis not present

## 2021-09-05 ENCOUNTER — Ambulatory Visit: Payer: Medicare Other | Admitting: Internal Medicine

## 2021-09-14 DIAGNOSIS — Z951 Presence of aortocoronary bypass graft: Secondary | ICD-10-CM | POA: Diagnosis not present

## 2021-09-14 DIAGNOSIS — M810 Age-related osteoporosis without current pathological fracture: Secondary | ICD-10-CM | POA: Diagnosis not present

## 2021-09-14 DIAGNOSIS — R918 Other nonspecific abnormal finding of lung field: Secondary | ICD-10-CM | POA: Diagnosis not present

## 2021-09-14 DIAGNOSIS — I6529 Occlusion and stenosis of unspecified carotid artery: Secondary | ICD-10-CM | POA: Diagnosis not present

## 2021-09-14 DIAGNOSIS — E559 Vitamin D deficiency, unspecified: Secondary | ICD-10-CM | POA: Diagnosis not present

## 2021-09-14 DIAGNOSIS — R7309 Other abnormal glucose: Secondary | ICD-10-CM | POA: Diagnosis not present

## 2021-09-14 DIAGNOSIS — E785 Hyperlipidemia, unspecified: Secondary | ICD-10-CM | POA: Diagnosis not present

## 2021-09-14 DIAGNOSIS — Z79899 Other long term (current) drug therapy: Secondary | ICD-10-CM | POA: Diagnosis not present

## 2021-09-14 DIAGNOSIS — R053 Chronic cough: Secondary | ICD-10-CM | POA: Diagnosis not present

## 2021-09-21 DIAGNOSIS — M810 Age-related osteoporosis without current pathological fracture: Secondary | ICD-10-CM | POA: Diagnosis not present

## 2021-09-21 DIAGNOSIS — E785 Hyperlipidemia, unspecified: Secondary | ICD-10-CM | POA: Diagnosis not present

## 2021-09-21 DIAGNOSIS — Z0000 Encounter for general adult medical examination without abnormal findings: Secondary | ICD-10-CM | POA: Diagnosis not present

## 2021-09-21 DIAGNOSIS — I6529 Occlusion and stenosis of unspecified carotid artery: Secondary | ICD-10-CM | POA: Diagnosis not present

## 2021-09-21 DIAGNOSIS — Z951 Presence of aortocoronary bypass graft: Secondary | ICD-10-CM | POA: Diagnosis not present

## 2021-09-21 DIAGNOSIS — R053 Chronic cough: Secondary | ICD-10-CM | POA: Diagnosis not present

## 2021-10-11 ENCOUNTER — Ambulatory Visit
Admission: RE | Admit: 2021-10-11 | Discharge: 2021-10-11 | Disposition: A | Discharge status: Home / Self Care | Payer: Medicare Other | Source: Ambulatory Visit | Attending: Sports Medicine | Admitting: Sports Medicine

## 2021-10-11 DIAGNOSIS — M810 Age-related osteoporosis without current pathological fracture: Secondary | ICD-10-CM

## 2021-10-11 DIAGNOSIS — Z780 Asymptomatic menopausal state: Secondary | ICD-10-CM | POA: Diagnosis not present

## 2021-10-11 DIAGNOSIS — M85851 Other specified disorders of bone density and structure, right thigh: Secondary | ICD-10-CM | POA: Diagnosis not present

## 2021-10-11 DIAGNOSIS — Z1231 Encounter for screening mammogram for malignant neoplasm of breast: Secondary | ICD-10-CM | POA: Diagnosis not present

## 2021-10-13 ENCOUNTER — Other Ambulatory Visit: Payer: Self-pay | Admitting: Sports Medicine

## 2021-10-13 DIAGNOSIS — R928 Other abnormal and inconclusive findings on diagnostic imaging of breast: Secondary | ICD-10-CM

## 2021-10-25 DIAGNOSIS — H401131 Primary open-angle glaucoma, bilateral, mild stage: Secondary | ICD-10-CM | POA: Diagnosis not present

## 2021-10-25 DIAGNOSIS — H43813 Vitreous degeneration, bilateral: Secondary | ICD-10-CM | POA: Diagnosis not present

## 2021-10-25 DIAGNOSIS — Z961 Presence of intraocular lens: Secondary | ICD-10-CM | POA: Diagnosis not present

## 2021-10-25 DIAGNOSIS — H35342 Macular cyst, hole, or pseudohole, left eye: Secondary | ICD-10-CM | POA: Diagnosis not present

## 2021-10-26 ENCOUNTER — Ambulatory Visit
Admission: RE | Admit: 2021-10-26 | Discharge: 2021-10-26 | Disposition: A | Discharge status: Home / Self Care | Payer: Medicare Other | Source: Ambulatory Visit | Attending: Sports Medicine | Admitting: Sports Medicine

## 2021-10-26 DIAGNOSIS — R921 Mammographic calcification found on diagnostic imaging of breast: Secondary | ICD-10-CM | POA: Diagnosis not present

## 2021-10-26 DIAGNOSIS — R928 Other abnormal and inconclusive findings on diagnostic imaging of breast: Secondary | ICD-10-CM

## 2021-11-02 DIAGNOSIS — M5030 Other cervical disc degeneration, unspecified cervical region: Secondary | ICD-10-CM | POA: Diagnosis not present

## 2021-11-02 DIAGNOSIS — M810 Age-related osteoporosis without current pathological fracture: Secondary | ICD-10-CM | POA: Diagnosis not present

## 2021-11-04 DIAGNOSIS — E785 Hyperlipidemia, unspecified: Secondary | ICD-10-CM | POA: Diagnosis not present

## 2021-11-04 DIAGNOSIS — M810 Age-related osteoporosis without current pathological fracture: Secondary | ICD-10-CM | POA: Diagnosis not present

## 2021-11-04 DIAGNOSIS — I2510 Atherosclerotic heart disease of native coronary artery without angina pectoris: Secondary | ICD-10-CM | POA: Diagnosis not present

## 2021-11-10 ENCOUNTER — Telehealth: Payer: Self-pay | Admitting: Internal Medicine

## 2021-11-10 DIAGNOSIS — R490 Dysphonia: Secondary | ICD-10-CM

## 2021-11-10 NOTE — Telephone Encounter (Signed)
Pt is requesting a new referral be placed to somewhere other then [facility] ENT r/t the ENT office keeps canceling visit on her. Dr. Shearon Stalls can we place a new ENT referral for pt?

## 2021-11-10 NOTE — Telephone Encounter (Signed)
Pt notified referral had been placed. Pt stated understanding. Nothing further needed at this time.

## 2021-11-10 NOTE — Telephone Encounter (Signed)
Yes please place another referral. If [facility] ent is cancelling i suggest [facility]. Would try to make it urgent since she's been waiting so long

## 2021-11-15 ENCOUNTER — Ambulatory Visit: Payer: Medicare Other | Admitting: Internal Medicine

## 2021-12-19 DIAGNOSIS — Z23 Encounter for immunization: Secondary | ICD-10-CM | POA: Diagnosis not present

## 2022-02-08 ENCOUNTER — Other Ambulatory Visit: Payer: Medicare Other

## 2022-02-15 ENCOUNTER — Ambulatory Visit: Payer: Medicare Other

## 2022-02-15 DIAGNOSIS — I2510 Atherosclerotic heart disease of native coronary artery without angina pectoris: Secondary | ICD-10-CM | POA: Diagnosis not present

## 2022-02-15 DIAGNOSIS — Z951 Presence of aortocoronary bypass graft: Secondary | ICD-10-CM | POA: Diagnosis not present

## 2022-02-19 NOTE — Progress Notes (Signed)
Spoke with patient, reminded her of appt and let her know results would be reviewed then.

## 2022-02-22 ENCOUNTER — Encounter: Payer: Self-pay | Admitting: Cardiology

## 2022-02-22 ENCOUNTER — Ambulatory Visit: Payer: Medicare Other | Admitting: Cardiology

## 2022-02-22 VITALS — BP 120/76 | HR 74 | Resp 16 | Ht 62.0 in | Wt 136.4 lb

## 2022-02-22 DIAGNOSIS — E782 Mixed hyperlipidemia: Secondary | ICD-10-CM

## 2022-02-22 DIAGNOSIS — I10 Essential (primary) hypertension: Secondary | ICD-10-CM | POA: Diagnosis not present

## 2022-02-22 DIAGNOSIS — Z95828 Presence of other vascular implants and grafts: Secondary | ICD-10-CM | POA: Diagnosis not present

## 2022-02-22 DIAGNOSIS — I2510 Atherosclerotic heart disease of native coronary artery without angina pectoris: Secondary | ICD-10-CM | POA: Diagnosis not present

## 2022-02-22 DIAGNOSIS — I6523 Occlusion and stenosis of bilateral carotid arteries: Secondary | ICD-10-CM | POA: Diagnosis not present

## 2022-02-22 DIAGNOSIS — Z951 Presence of aortocoronary bypass graft: Secondary | ICD-10-CM | POA: Diagnosis not present

## 2022-02-22 DIAGNOSIS — Z9889 Other specified postprocedural states: Secondary | ICD-10-CM | POA: Diagnosis not present

## 2022-02-22 NOTE — Progress Notes (Signed)
Haynes Date of Birth: 1933/07/15 MRN: [id number] Primary Care Provider:Collins, Hinton Dyer, DO  Former cardiology providers: Jeri Lager, MSN, APRN, FNP-C  Primary cardiology:Jasenia Weilbacher [facility] (established care 05/20/2019)  Date: 02/22/22 Last Office Visit: 02/17/2021  Chief Complaint  Patient presents with   Coronary Artery Disease   Follow-up    1 year    HPI  [name] is a 89 y.o.  female whose past medical history and cardiovascular risk factors include: Established coronary artery disease without angina pectoris, status post three-vessel CABG in 2007, mixed hyperlipidemia, carotid artery disease with history of right carotid endarterectomy and carotid stent, postmenopausal female, advanced age.  Patient presents for 56-monthfollow-up given her history of CAD and three-vessel coronary bypass surgery.  She is doing well from a cardiovascular standpoint.  Denies anginal discomfort or heart failure symptoms.  Overall functional capacity remains stable.  No recent labs.  She is scheduled to have some done in January/February 2024.  I have asked her to send [place] copy.  She also follows up with Dr. FOneida Alargiven her history of carotid disease status post endarterectomy and stenting.  FUNCTIONAL STATUS: No structured exercise program or daily routine.  But she has 14 steps in her house that she goes up and down approximately 4-5 times per day without any effort related symptoms.   ALLERGIES: Allergies  Allergen Reactions   Hydrocodone Anaphylaxis, Shortness Of Breath, Itching and Rash    Rash-itching-tongue swelled   Fenofibrate Other (See Comments)    Caused every joint to become stif   Atorvastatin     Other reaction(s): Unknown   Hydrocodone-Acetaminophen Other (See Comments)   Niacin And Related     Other reaction(s): Unknown   Latex Rash and Other (See Comments)    "Blisters in mouth"   Oxycodone Itching, Swelling and Rash      MEDICATION LIST PRIOR TO VISIT: Current Outpatient Medications on File Prior to Visit  Medication Sig Dispense Refill   acetaminophen (TYLENOL) 500 MG tablet Take 500 mg by mouth every 6 (six) hours as needed for moderate pain or headache.     aspirin 81 MG tablet Take 81 mg by mouth daily.     calcium carbonate (OSCAL) 1500 (600 Ca) MG TABS tablet Take 600 mg of elemental calcium by mouth 2 (two) times daily with a meal.     Cholecalciferol (VITAMIN D) 50 MCG (2000 UT) tablet Take 2,000 Units by mouth in the morning and at bedtime.     denosumab (PROLIA) 60 MG/ML SOSY injection Inject 60 mg into the skin every 6 (six) months.      ezetimibe (ZETIA) 10 MG tablet Take 10 mg by mouth every other day. Alternates zetia one day and crestor the next     losartan-hydrochlorothiazide (HYZAAR) 50-12.5 MG tablet Take 1 tablet by mouth daily.     metoprolol tartrate (LOPRESSOR) 25 MG tablet Take 1 tablet (25 mg total) by mouth 2 (two) times daily.     rosuvastatin (CRESTOR) 20 MG tablet Take 20 mg by mouth every other day. Alternates zetia one day and crestor the next     tobramycin-dexamethasone (TOBRADEX) ophthalmic solution Place 1 drop into the left eye 4 (four) times daily. For one week 5 mL 0   No current facility-administered medications on file prior to visit.    PAST MEDICAL HISTORY: Past Medical History:  Diagnosis Date   Arthritis    Chronic cough    x 40 yrs - unknown  orgin- checked out by Dr Velora Heckler   Coronary artery disease    Diverticulitis    Hypercholesteremia    Hypertension    Internal carotid artery stenosis 08/2012   Normal cardiac stress test    @ Dr Irven Shelling office   Peripheral vascular disease [facility])    Thyroid disease     PAST SURGICAL HISTORY: Past Surgical History:  Procedure Laterality Date   25 GAUGE PARS PLANA VITRECTOMY WITH 20 GAUGE MVR PORT FOR MACULAR HOLE Left 10/07/2018   Procedure: 25 GAUGE PARS PLANA VITRECTOMY WITH 20 GAUGE MVR PORT;  Surgeon:  [name], MD;  Location: [place];  Service: Ophthalmology;  Laterality: Left;   ABDOMINAL HYSTERECTOMY     APPENDECTOMY     CARDIAC CATHETERIZATION  2007   CAROTID ANGIOGRAM N/A 04/01/2013   Procedure: CAROTID Cyril Loosen;  Surgeon: Elam Dutch, MD;  Location: [facility] CATH LAB;  Service: Cardiovascular;  Laterality: N/A;   CAROTID ENDARTERECTOMY Right August 25, 2012   cea   CAROTID STENT INSERTION Right 04/22/2013   Procedure: CAROTID STENT INSERTION;  Surgeon: Elam Dutch, MD;  Location: [facility] CATH LAB;  Service: Cardiovascular;  Laterality: Right;   COLONOSCOPY     CORONARY ARTERY BYPASS GRAFT     ENDARTERECTOMY Right 08/25/2012   Procedure: ENDARTERECTOMY CAROTID;  Surgeon: Elam Dutch, MD;  Location: [place];  Service: Vascular;  Laterality: Right;   EYE SURGERY Bilateral    both cataracts   FRACTURE SURGERY Left Jun 14, 2011   Ankle   GAS INSERTION Left 10/07/2018   Procedure: GAS INJECTION LEFT EYE;  Surgeon: [name], MD;  Location: [place];  Service: Ophthalmology;  Laterality: Left;   MEMBRANE PEEL Left 10/07/2018   Procedure: INTERNAL LIMITING MEMBRANE PEEL;  Surgeon: [name], MD;  Location: [place];  Service: Ophthalmology;  Laterality: Left;   ORIF ANKLE FRACTURE  06/14/2011   Procedure: OPEN REDUCTION INTERNAL FIXATION (ORIF) ANKLE FRACTURE;  Surgeon: Ninetta Lights, MD;  Location: [place];  Service: Orthopedics;  Laterality: Left;  left ankle fracture open treatment trimalleolar ankle includes internal fixation WITHOUT fixation of posterior lip   PHOTOCOAGULATION WITH LASER Left 10/07/2018   Procedure: PHOTOCOAGULATION WITH LASER;  Surgeon: [name], MD;  Location: [place];  Service: Ophthalmology;  Laterality: Left;   SERUM PATCH Left 10/07/2018   Procedure: LEFT EYE SERUM PATCH FOR MACULAR HOLE;  Surgeon: [name], MD;  Location: [place];  Service: Ophthalmology;  Laterality: Left;   SHOULDER ARTHROSCOPY WITH SUBACROMIAL DECOMPRESSION,  ROTATOR CUFF REPAIR AND BICEP TENDON REPAIR Left 06/05/2012   Procedure: LEFT SHOULDER ARTHROSCOPY WITH SUBACROMIAL DECOMPRESSION, PARTIAL ACROMIOPLASTY WITH CORACOACROMIAL RELEASE, DISTAL CLAVICULECTOMY, WITH ROTATOR CUFF REPAIR AND BICEP TENODESIS;  Surgeon: Ninetta Lights, MD;  Location: [place];  Service: Orthopedics;  Laterality: Left;   STRABISMUS SURGERY Left 06/24/2020   Procedure: REPAIR STRABISMUS LEFT EYE;  Surgeon: Lamonte Sakai, MD;  Location: [place];  Service: Ophthalmology;  Laterality: Left;    FAMILY HISTORY: The patient's family history includes Cancer in her father and sister; Diabetes in her sister; Heart disease in her brother and sister; Hyperlipidemia in her sister.   SOCIAL HISTORY:  The patient  reports that she has never smoked. She has never used smokeless tobacco. She reports current alcohol use of about 7.0 standard drinks of alcohol per week. She reports that she does not use drugs.  Review of Systems  Constitutional: Negative for chills  and fever.  HENT:  Negative for ear discharge, ear pain and nosebleeds.   Eyes:  Negative for blurred vision and discharge.  Cardiovascular:  Negative for chest pain, claudication, dyspnea on exertion, leg swelling, near-syncope, orthopnea, palpitations, paroxysmal nocturnal dyspnea and syncope.  Respiratory:  Negative for cough and shortness of breath.   Endocrine: Negative for polydipsia, polyphagia and polyuria.  Hematologic/Lymphatic: Negative for bleeding problem.  Skin:  Negative for flushing and nail changes.  Musculoskeletal:  Negative for muscle cramps, muscle weakness and myalgias.  Gastrointestinal:  Negative for abdominal pain, dysphagia, hematemesis, hematochezia, melena, nausea and vomiting.  Neurological:  Negative for dizziness, focal weakness and light-headedness.    PHYSICAL EXAM:    02/22/2022    2:03 PM 05/31/2021   11:13 AM 02/17/2021    2:30 PM  Vitals with BMI  Height '5\' 2"'$  '5\' 2"'$  '5\' 1"'$    Weight 136 lbs 6 oz 137 lbs 13 oz 139 lbs 13 oz  BMI 24.94 73.2 20.25  Systolic 427 062 376  Diastolic 76 60 77  Pulse 74 70 89   Physical Exam  Constitutional: No distress.  Age appropriate, hemodynamically stable.   Neck: No JVD present.  Right carotid endarterectomy site is clean dry and intact.  Cardiovascular: Normal rate, regular rhythm, S1 normal, S2 normal, intact distal pulses and normal pulses. Exam reveals no gallop, no S3 and no S4.  No murmur heard. Pulmonary/Chest: Effort normal and breath sounds normal. No stridor. She has no wheezes. She has no rales.  Abdominal: Soft. Bowel sounds are normal. She exhibits no distension. There is no abdominal tenderness.  Musculoskeletal:        General: No edema.     Cervical back: Neck supple.  Neurological: She is alert and oriented to person, place, and time. She has intact cranial nerves (2-12).  Skin: Skin is warm and moist.   CARDIAC DATABASE: EKG: 02/22/2022: Sinus rhythm, 67 bpm, without underlying ischemia injury pattern, frequent PVCs.  Compared to prior EKG dated 02/17/2021 PVCs are new.  Echocardiogram: 02/15/2022: Normal LV systolic function with visual EF 55-60%. Left ventricle cavity is normal in size. Normal left ventricular wall thickness. Normal global wall motion. Normal diastolic filling pattern, normal LAP.  Mild (Grade I) mitral regurgitation. Trace tricuspid regurgitation. No evidence of pulmonary hypertension. Compared to 03/22/2018 no significant change.   Stress Testing:  08/18/2012: Myoview stress test reported normal isotope uptake in both rest and stress images.  No evidence of ischemia or scar.  LVEF by gated SPECT 88%.  Carotid Duplex 08/2017: Right Carotid: There is no evidence of stenosis in the right ICA. Patent stent without evidence of restenosis.  Left Carotid: Velocities in the left ICA are consistent with a 1-39% stenosis. The ECA appears >50% stenosed. Vertebrals:  Bilateral vertebral  arteries demonstrate antegrade flow.  Subclavians: Normal flow hemodynamics were seen in bilateral subclavian arteries.   LABORATORY DATA:    Latest Ref Rng & Units 01/06/2021    4:35 PM 10/07/2018    9:59 AM 03/23/2018    5:08 AM  CBC  WBC 4.0 - 10.5 K/uL 7.0  5.1  6.0   Hemoglobin 12.0 - 15.0 g/dL 14.5  13.6  11.9   Hematocrit 36.0 - 46.0 % 43.9  41.1  37.4   Platelets 150 - 400 K/uL 356  222  276        Latest Ref Rng & Units 01/06/2021    4:35 PM 01/06/2021    9:39 AM 06/24/2020  6:06 AM  CMP  Glucose 70 - 99 mg/dL 110  107  94   BUN 8 - 23 mg/dL '12  13  15   '$ Creatinine 0.44 - 1.00 mg/dL 0.73  0.74  0.87   Sodium 135 - 145 mmol/L 134  132  135   Potassium 3.5 - 5.1 mmol/L 4.7  6.0  3.3   Chloride 98 - 111 mmol/L 94  95  99   CO2 22 - 32 mmol/L '30  25  30   '$ Calcium 8.9 - 10.3 mg/dL 9.5  8.9  9.7   Total Protein 6.5 - 8.1 g/dL 7.5     Total Bilirubin 0.3 - 1.2 mg/dL 0.7     Alkaline Phos 38 - 126 U/L 61     AST 15 - 41 U/L 59     ALT 0 - 44 U/L 62       External Labs: Collected: 09/08/2020 provided by primary care Hemoglobin 14.8 g/dL, hematocrit 43.4% Sodium 142, potassium 4.8, bicarb 33, creatinine 0.78, BUN 18 hemoglobin A1c 5.3 Total cholesterol 179, triglycerides 133, HDL 50, LDL 102, non-HDL 129  FINAL MEDICATION LIST END OF ENCOUNTER: No orders of the defined types were placed in this encounter.   Medications Discontinued During This Encounter  Medication Reason   omeprazole (PRILOSEC) 40 MG capsule Patient Preference      Current Outpatient Medications:    acetaminophen (TYLENOL) 500 MG tablet, Take 500 mg by mouth every 6 (six) hours as needed for moderate pain or headache., Disp: , Rfl:    aspirin 81 MG tablet, Take 81 mg by mouth daily., Disp: , Rfl:    calcium carbonate (OSCAL) 1500 (600 Ca) MG TABS tablet, Take 600 mg of elemental calcium by mouth 2 (two) times daily with a meal., Disp: , Rfl:    Cholecalciferol (VITAMIN D) 50 MCG (2000 UT) tablet, Take  2,000 Units by mouth in the morning and at bedtime., Disp: , Rfl:    denosumab (PROLIA) 60 MG/ML SOSY injection, Inject 60 mg into the skin every 6 (six) months. , Disp: , Rfl:    ezetimibe (ZETIA) 10 MG tablet, Take 10 mg by mouth every other day. Alternates zetia one day and crestor the next, Disp: , Rfl:    losartan-hydrochlorothiazide (HYZAAR) 50-12.5 MG tablet, Take 1 tablet by mouth daily., Disp: , Rfl:    metoprolol tartrate (LOPRESSOR) 25 MG tablet, Take 1 tablet (25 mg total) by mouth 2 (two) times daily., Disp: , Rfl:    rosuvastatin (CRESTOR) 20 MG tablet, Take 20 mg by mouth every other day. Alternates zetia one day and crestor the next, Disp: , Rfl:    tobramycin-dexamethasone (TOBRADEX) ophthalmic solution, Place 1 drop into the left eye 4 (four) times daily. For one week, Disp: 5 mL, Rfl: 0  IMPRESSION:    ICD-10-CM   1. Coronary artery disease involving native coronary artery of native heart without angina pectoris  I25.10 EKG 12-Lead    2. Hx of CABG  Z95.1 EKG 12-Lead    3. Essential hypertension  I10     4. Carotid stenosis, bilateral  I65.23     5. History of right-sided carotid endarterectomy  Z98.890     6. History of right carotid artery stent placement  Z98.890    Z95.828     7. Mixed hyperlipidemia  E78.2        RECOMMENDATIONS: [name] is a 89 y.o. female whose past medical history and cardiovascular risk factors include:  Established coronary artery disease without angina pectoris, status post three-vessel CABG in 2007, mixed hyperlipidemia, carotid artery disease with history of right carotid endarterectomy and carotid stent, postmenopausal female, advanced age.  Coronary artery disease involving native coronary artery of native heart without angina pectoris / Hx of CABG Denies angina pectoris. Medications reconciled. EKG: Normal sinus without underlying injury pattern w/ PVCs. Echo: LVEF 55 to 60% (03/2022) Stress test: Low risk  study (08/2012) Continue aspirin, statin, Zetia. We discussed up titration of Lopressor given her PVCs but she would like to continue the current dose for now. We emphasized the importance of improving her modifiable cardiovascular risk factors. She will have labs with PCP either in January or February 2024 she is asked to send [place] a copy for reference.  Essential hypertension Office blood pressures are well controlled. Medications reconciled.   No changes warranted at this time. Reemphasized importance of low-salt diet.  Carotid stenosis, bilateral / History of right-sided carotid endarterectomy / History of right carotid artery stent Follows with Dr. Oneida Alar Continue aspirin, Zetia, rosuvastatin. She is encouraged to follow-up with her provider.  Mixed hyperlipidemia Currently on Zetia, rosuvastatin.   She denies myalgia or other side effects. Currently managed by primary care provider.  Orders Placed This Encounter  Procedures   EKG 12-Lead   --Continue cardiac medications as reconciled in final medication list. --Return in about 1 year (around 02/23/2023) for Follow up, CAD. Or sooner if needed. --Continue follow-up with your primary care physician regarding the management of your other chronic comorbid conditions.  Patient's questions and concerns were addressed to her satisfaction. She voices understanding of the instructions provided during this encounter.   This note was created using a voice recognition software as a result there may be grammatical errors inadvertently enclosed that do not reflect the nature of this encounter. Every attempt is made to correct such errors.  Rex Kras, [place], [facility]  Pager: [phone] Office: [phone]

## 2022-02-28 DIAGNOSIS — H43813 Vitreous degeneration, bilateral: Secondary | ICD-10-CM | POA: Diagnosis not present

## 2022-02-28 DIAGNOSIS — Z961 Presence of intraocular lens: Secondary | ICD-10-CM | POA: Diagnosis not present

## 2022-02-28 DIAGNOSIS — H401131 Primary open-angle glaucoma, bilateral, mild stage: Secondary | ICD-10-CM | POA: Diagnosis not present

## 2022-02-28 DIAGNOSIS — H35342 Macular cyst, hole, or pseudohole, left eye: Secondary | ICD-10-CM | POA: Diagnosis not present

## 2022-03-15 DIAGNOSIS — M810 Age-related osteoporosis without current pathological fracture: Secondary | ICD-10-CM | POA: Diagnosis not present

## 2022-03-15 DIAGNOSIS — R053 Chronic cough: Secondary | ICD-10-CM | POA: Diagnosis not present

## 2022-03-15 DIAGNOSIS — E785 Hyperlipidemia, unspecified: Secondary | ICD-10-CM | POA: Diagnosis not present

## 2022-03-15 DIAGNOSIS — Z951 Presence of aortocoronary bypass graft: Secondary | ICD-10-CM | POA: Diagnosis not present

## 2022-03-15 DIAGNOSIS — R7309 Other abnormal glucose: Secondary | ICD-10-CM | POA: Diagnosis not present

## 2022-03-15 DIAGNOSIS — I6529 Occlusion and stenosis of unspecified carotid artery: Secondary | ICD-10-CM | POA: Diagnosis not present

## 2022-04-04 DIAGNOSIS — I6529 Occlusion and stenosis of unspecified carotid artery: Secondary | ICD-10-CM | POA: Diagnosis not present

## 2022-04-04 DIAGNOSIS — G250 Essential tremor: Secondary | ICD-10-CM | POA: Diagnosis not present

## 2022-04-04 DIAGNOSIS — I10 Essential (primary) hypertension: Secondary | ICD-10-CM | POA: Diagnosis not present

## 2022-04-04 DIAGNOSIS — Z951 Presence of aortocoronary bypass graft: Secondary | ICD-10-CM | POA: Diagnosis not present

## 2022-04-04 DIAGNOSIS — M6530 Trigger finger, unspecified finger: Secondary | ICD-10-CM | POA: Diagnosis not present

## 2022-04-04 DIAGNOSIS — R200 Anesthesia of skin: Secondary | ICD-10-CM | POA: Diagnosis not present

## 2022-04-04 DIAGNOSIS — I2510 Atherosclerotic heart disease of native coronary artery without angina pectoris: Secondary | ICD-10-CM | POA: Diagnosis not present

## 2022-04-11 NOTE — Progress Notes (Signed)
External Labs: Collected: 03/15/2022 provided by primary physician Hemoglobin 14.4 g/dL, hematocrit 41.7% AST 20, ALT 28, alkaline phosphatase 54. BUN 16, creatinine 0.79. Sodium 141, potassium 3.9, chloride 100, bicarb 30 GFR >60 A1c 5.4. Total cholesterol 154, triglycerides 135, calculated LDL 72, HDL 55, non-HDL 99

## 2022-04-18 DIAGNOSIS — G5603 Carpal tunnel syndrome, bilateral upper limbs: Secondary | ICD-10-CM | POA: Diagnosis not present

## 2022-04-18 DIAGNOSIS — M65341 Trigger finger, right ring finger: Secondary | ICD-10-CM | POA: Diagnosis not present

## 2022-05-16 DIAGNOSIS — G5603 Carpal tunnel syndrome, bilateral upper limbs: Secondary | ICD-10-CM | POA: Diagnosis not present

## 2022-05-23 ENCOUNTER — Encounter (INDEPENDENT_AMBULATORY_CARE_PROVIDER_SITE_OTHER): Payer: 59 | Admitting: Ophthalmology

## 2022-05-30 DIAGNOSIS — M810 Age-related osteoporosis without current pathological fracture: Secondary | ICD-10-CM | POA: Diagnosis not present

## 2022-05-31 ENCOUNTER — Encounter (INDEPENDENT_AMBULATORY_CARE_PROVIDER_SITE_OTHER): Payer: Medicare Other | Admitting: Ophthalmology

## 2022-05-31 DIAGNOSIS — H43811 Vitreous degeneration, right eye: Secondary | ICD-10-CM

## 2022-05-31 DIAGNOSIS — H59032 Cystoid macular edema following cataract surgery, left eye: Secondary | ICD-10-CM

## 2022-05-31 DIAGNOSIS — H353111 Nonexudative age-related macular degeneration, right eye, early dry stage: Secondary | ICD-10-CM

## 2022-05-31 DIAGNOSIS — H35342 Macular cyst, hole, or pseudohole, left eye: Secondary | ICD-10-CM

## 2022-05-31 DIAGNOSIS — H35033 Hypertensive retinopathy, bilateral: Secondary | ICD-10-CM

## 2022-05-31 DIAGNOSIS — H353122 Nonexudative age-related macular degeneration, left eye, intermediate dry stage: Secondary | ICD-10-CM | POA: Diagnosis not present

## 2022-05-31 DIAGNOSIS — I10 Essential (primary) hypertension: Secondary | ICD-10-CM | POA: Diagnosis not present

## 2022-08-01 DIAGNOSIS — M7062 Trochanteric bursitis, left hip: Secondary | ICD-10-CM | POA: Diagnosis not present

## 2022-09-19 DIAGNOSIS — Z951 Presence of aortocoronary bypass graft: Secondary | ICD-10-CM | POA: Diagnosis not present

## 2022-09-19 DIAGNOSIS — I2510 Atherosclerotic heart disease of native coronary artery without angina pectoris: Secondary | ICD-10-CM | POA: Diagnosis not present

## 2022-09-19 DIAGNOSIS — G250 Essential tremor: Secondary | ICD-10-CM | POA: Diagnosis not present

## 2022-09-19 DIAGNOSIS — I10 Essential (primary) hypertension: Secondary | ICD-10-CM | POA: Diagnosis not present

## 2022-09-26 DIAGNOSIS — R7309 Other abnormal glucose: Secondary | ICD-10-CM | POA: Diagnosis not present

## 2022-09-26 DIAGNOSIS — I2510 Atherosclerotic heart disease of native coronary artery without angina pectoris: Secondary | ICD-10-CM | POA: Diagnosis not present

## 2022-09-26 DIAGNOSIS — R053 Chronic cough: Secondary | ICD-10-CM | POA: Diagnosis not present

## 2022-09-26 DIAGNOSIS — G250 Essential tremor: Secondary | ICD-10-CM | POA: Diagnosis not present

## 2022-09-26 DIAGNOSIS — Z0000 Encounter for general adult medical examination without abnormal findings: Secondary | ICD-10-CM | POA: Diagnosis not present

## 2022-09-26 DIAGNOSIS — Z951 Presence of aortocoronary bypass graft: Secondary | ICD-10-CM | POA: Diagnosis not present

## 2022-09-26 DIAGNOSIS — I6529 Occlusion and stenosis of unspecified carotid artery: Secondary | ICD-10-CM | POA: Diagnosis not present

## 2022-09-26 DIAGNOSIS — I10 Essential (primary) hypertension: Secondary | ICD-10-CM | POA: Diagnosis not present

## 2022-09-26 DIAGNOSIS — E785 Hyperlipidemia, unspecified: Secondary | ICD-10-CM | POA: Diagnosis not present

## 2022-09-26 DIAGNOSIS — M810 Age-related osteoporosis without current pathological fracture: Secondary | ICD-10-CM | POA: Diagnosis not present

## 2022-10-29 DIAGNOSIS — H43813 Vitreous degeneration, bilateral: Secondary | ICD-10-CM | POA: Diagnosis not present

## 2022-10-29 DIAGNOSIS — Z961 Presence of intraocular lens: Secondary | ICD-10-CM | POA: Diagnosis not present

## 2022-10-29 DIAGNOSIS — H401131 Primary open-angle glaucoma, bilateral, mild stage: Secondary | ICD-10-CM | POA: Diagnosis not present

## 2022-11-12 DIAGNOSIS — M25511 Pain in right shoulder: Secondary | ICD-10-CM | POA: Diagnosis not present

## 2022-11-12 DIAGNOSIS — R0781 Pleurodynia: Secondary | ICD-10-CM | POA: Diagnosis not present

## 2022-11-19 DIAGNOSIS — M25511 Pain in right shoulder: Secondary | ICD-10-CM | POA: Diagnosis not present

## 2022-11-28 DIAGNOSIS — M25511 Pain in right shoulder: Secondary | ICD-10-CM | POA: Diagnosis not present

## 2022-12-05 DIAGNOSIS — M810 Age-related osteoporosis without current pathological fracture: Secondary | ICD-10-CM | POA: Diagnosis not present

## 2022-12-19 DIAGNOSIS — M25511 Pain in right shoulder: Secondary | ICD-10-CM | POA: Diagnosis not present

## 2022-12-21 DIAGNOSIS — Z23 Encounter for immunization: Secondary | ICD-10-CM | POA: Diagnosis not present

## 2022-12-25 DIAGNOSIS — M25511 Pain in right shoulder: Secondary | ICD-10-CM | POA: Diagnosis not present

## 2023-01-01 DIAGNOSIS — Z23 Encounter for immunization: Secondary | ICD-10-CM | POA: Diagnosis not present

## 2023-01-07 DIAGNOSIS — M25611 Stiffness of right shoulder, not elsewhere classified: Secondary | ICD-10-CM | POA: Diagnosis not present

## 2023-01-07 DIAGNOSIS — M6281 Muscle weakness (generalized): Secondary | ICD-10-CM | POA: Diagnosis not present

## 2023-01-07 DIAGNOSIS — S46011D Strain of muscle(s) and tendon(s) of the rotator cuff of right shoulder, subsequent encounter: Secondary | ICD-10-CM | POA: Diagnosis not present

## 2023-01-23 DIAGNOSIS — S46011D Strain of muscle(s) and tendon(s) of the rotator cuff of right shoulder, subsequent encounter: Secondary | ICD-10-CM | POA: Diagnosis not present

## 2023-01-23 DIAGNOSIS — M6281 Muscle weakness (generalized): Secondary | ICD-10-CM | POA: Diagnosis not present

## 2023-01-23 DIAGNOSIS — M25611 Stiffness of right shoulder, not elsewhere classified: Secondary | ICD-10-CM | POA: Diagnosis not present

## 2023-01-24 ENCOUNTER — Encounter (HOSPITAL_COMMUNITY): Payer: Self-pay | Admitting: Emergency Medicine

## 2023-01-24 ENCOUNTER — Emergency Department (HOSPITAL_COMMUNITY): Payer: Medicare Other

## 2023-01-24 ENCOUNTER — Other Ambulatory Visit: Payer: Self-pay

## 2023-01-24 ENCOUNTER — Ambulatory Visit: Payer: Self-pay | Admitting: *Deleted

## 2023-01-24 ENCOUNTER — Emergency Department (HOSPITAL_COMMUNITY)
Admission: EM | Admit: 2023-01-24 | Discharge: 2023-01-24 | Disposition: A | Discharge status: Home / Self Care | Payer: Medicare Other | Attending: Emergency Medicine | Admitting: Emergency Medicine

## 2023-01-24 DIAGNOSIS — R079 Chest pain, unspecified: Secondary | ICD-10-CM

## 2023-01-24 DIAGNOSIS — Z7982 Long term (current) use of aspirin: Secondary | ICD-10-CM | POA: Diagnosis not present

## 2023-01-24 DIAGNOSIS — I471 Supraventricular tachycardia, unspecified: Secondary | ICD-10-CM

## 2023-01-24 DIAGNOSIS — I2510 Atherosclerotic heart disease of native coronary artery without angina pectoris: Secondary | ICD-10-CM | POA: Diagnosis not present

## 2023-01-24 DIAGNOSIS — I700 Atherosclerosis of aorta: Secondary | ICD-10-CM | POA: Diagnosis not present

## 2023-01-24 DIAGNOSIS — Z79899 Other long term (current) drug therapy: Secondary | ICD-10-CM | POA: Diagnosis not present

## 2023-01-24 DIAGNOSIS — Z91040 Latex allergy status: Secondary | ICD-10-CM | POA: Diagnosis not present

## 2023-01-24 DIAGNOSIS — I10 Essential (primary) hypertension: Secondary | ICD-10-CM | POA: Diagnosis not present

## 2023-01-24 DIAGNOSIS — I159 Secondary hypertension, unspecified: Secondary | ICD-10-CM | POA: Insufficient documentation

## 2023-01-24 DIAGNOSIS — R0789 Other chest pain: Secondary | ICD-10-CM | POA: Insufficient documentation

## 2023-01-24 LAB — BASIC METABOLIC PANEL
Anion gap: 12 (ref 5–15)
BUN: 17 mg/dL (ref 8–23)
CO2: 29 mmol/L (ref 22–32)
Calcium: 9.1 mg/dL (ref 8.9–10.3)
Chloride: 95 mmol/L — ABNORMAL LOW (ref 98–111)
Creatinine, Ser: 0.82 mg/dL (ref 0.44–1.00)
GFR, Estimated: >60 mL/min (ref >60)
Glucose, Bld: 103 mg/dL — ABNORMAL HIGH (ref 70–99)
Potassium: 3.7 mmol/L (ref 3.5–5.1)
Sodium: 136 mmol/L (ref 135–145)

## 2023-01-24 LAB — TSH: TSH: 1.631 u[IU]/mL (ref 0.350–4.500)

## 2023-01-24 LAB — TROPONIN I (HIGH SENSITIVITY)
Troponin I (High Sensitivity): 11 ng/L (ref <18)
Troponin I (High Sensitivity): 19 ng/L — ABNORMAL HIGH (ref <18)
Troponin I (High Sensitivity): 21 ng/L — ABNORMAL HIGH (ref <18)

## 2023-01-24 LAB — CBC
HCT: 44 % (ref 36.0–46.0)
Hemoglobin: 15 g/dL (ref 12.0–15.0)
MCH: 34.2 pg — ABNORMAL HIGH (ref 26.0–34.0)
MCHC: 34.1 g/dL (ref 30.0–36.0)
MCV: 100.5 fL — ABNORMAL HIGH (ref 80.0–100.0)
Platelets: 315 10*3/uL (ref 150–400)
RBC: 4.38 MIL/uL (ref 3.87–5.11)
RDW: 12.1 % (ref 11.5–15.5)
WBC: 6.1 10*3/uL (ref 4.0–10.5)
nRBC: 0 % (ref 0.0–0.2)

## 2023-01-24 MED ORDER — DILTIAZEM HCL 25 MG/5ML IV SOLN
10.0000 mg | Freq: Once | INTRAVENOUS | Status: AC
  Administered 2023-01-24: 10 mg via INTRAVENOUS
  Filled 2023-01-24: qty 5

## 2023-01-24 MED ORDER — METOPROLOL TARTRATE 5 MG/5ML IV SOLN: 2.5000 mg | Freq: Once | INTRAVENOUS | Status: DC

## 2023-01-24 MED ORDER — ADENOSINE 6 MG/2ML IV SOLN: 6.0000 mg | Freq: Once | INTRAVENOUS | Status: DC

## 2023-01-24 NOTE — ED Triage Notes (Signed)
Pt. Stated, I started having chest pain/tightness this morning around 0800 for about 30 min. And it stopped and came back within the last 30 min in ED. Denies any other symptoms.

## 2023-01-24 NOTE — ED Provider Notes (Addendum)
  Physical Exam  BP (!) 114/90   Pulse 99   Temp 98 F (36.7 C) (Oral)   Resp (!) 28   Ht 5\' 2"  (1.575 m)   Wt 58.5 kg   SpO2 96%   BMI 23.59 kg/m   Physical Exam  Procedures  Procedures  ED Course / MDM    Medical Decision Making Amount and/or Complexity of Data Reviewed Labs: ordered. Radiology: ordered.  Risk Prescription drug management.   Wardell Honour, assumed care for this patient.  In brief this is a 89 year old female who presented to the emergency department today due to pain in the chest which resolved.  Patient was signed out pending repeat delta troponin.  Went to reevaluate the patient, noticed that her heart rate suddenly jumped into the 150s.  I printed an EKG which showed SVT.  Patient says that she started to have some discomfort when that happened again, and says that this felt how she did earlier in the day.  Patient received IV diltiazem which aborted SVT.  Looking at the patient's labs, no significant abnormalities.  Troponin with a minor increase but not a significant rise.  SVT less consistent with ACS.  Discussed admission versus discharge with the patient and she preferred to go home and follow-up with her cardiologist.  Through shared decision making I think this is reasonable selection.  Will have her follow-up with her cardiologist within the next 1 to 2 weeks.  Return precautions discussed with the patient at bedside.  Patient's respirations were marked at 28, however believe this is mostly parotid how she is sitting.  When I set the patient up, she is not in any respiratory distress and is not tachypneic.  She was mostly just having shallow breathing.       Anders Simmonds T, DO 01/24/23 2045    Anders Simmonds T, DO 01/24/23 2046

## 2023-01-24 NOTE — ED Notes (Signed)
Pt resting, no complaints, and VS stable at this time.

## 2023-01-24 NOTE — Discharge Instructions (Addendum)
Please follow-up with your cardiologist as soon as possible.  You can give the phone call tomorrow and tell them that you were diagnosed with SVT in the emergency room.  Return emergency department if you develop any worsening pain in her chest or difficulty with your breathing.

## 2023-01-24 NOTE — ED Provider Notes (Addendum)
[place] EMERGENCY DEPARTMENT AT [facility] Provider Note   CSN: [id number] Arrival date & time: 01/24/23  1026     History  Chief Complaint  Patient presents with   Chest Pain   Arm Pain    Bindu Daughtridge Trousdale is a 89 y.o. female.  Patient is an 89 year old female with past medical history of hypertension, myocardial infarction, hyperlipidemia, and coronary artery disease presenting for complaints of chest tightness.  Patient admits to chest tightness that began at 8 AM while walking to the bathroom and lasted for approximately 30 minutes.  States all symptoms are resolved at this time.  She denies any shortness of breath.  Denies any orthopnea, lower extremity swelling, or weight gain.  She denies any fevers, chills, coughing.  The history is provided by the patient. No language interpreter was used.  Chest Pain Associated symptoms: no abdominal pain, no back pain, no cough, no fever, no palpitations, no shortness of breath and no vomiting   Arm Pain Associated symptoms include chest pain. Pertinent negatives include no abdominal pain and no shortness of breath.       Home Medications Prior to Admission medications   Medication Sig Start Date End Date Taking? Authorizing Provider  acetaminophen (TYLENOL) 500 MG tablet Take 500 mg by mouth every 6 (six) hours as needed for moderate pain or headache.   Yes [provider]  aspirin 81 MG tablet Take 81 mg by mouth daily.   Yes [provider]  Calcium Carbonate-Vit D-Min (CALCIUM 600+D PLUS MINERALS) 600-400 MG-UNIT TABS Take 1 tablet by mouth daily.   Yes [provider]  citalopram (CELEXA) 20 MG tablet Take 20 mg by mouth daily.   Yes [provider]  latanoprost (XALATAN) 0.005 % ophthalmic solution Place 1 drop into the right eye at bedtime.   Yes [provider]  losartan-hydrochlorothiazide (HYZAAR) 50-12.5 MG tablet Take 1 tablet by mouth daily.   Yes  [provider]  metoprolol tartrate (LOPRESSOR) 25 MG tablet Take 1 tablet (25 mg total) by mouth 2 (two) times daily. 08/29/12  Yes Rhyne, Ames Coupe, PA-C  primidone (MYSOLINE) 50 MG tablet Take 50 mg by mouth at bedtime. 01/07/23  Yes [provider]  brimonidine (ALPHAGAN) 0.2 % ophthalmic solution Place 1 drop into the left eye 2 (two) times daily.   Yes [provider]  denosumab (PROLIA) 60 MG/ML SOSY injection Inject 60 mg into the skin every 6 (six) months.    Yes [provider]  ezetimibe (ZETIA) 10 MG tablet Take 10 mg by mouth every other day. Alternates zetia one day and crestor the next   Yes [provider]  rosuvastatin (CRESTOR) 20 MG tablet Take 20 mg by mouth every other day. Alternates zetia one day and crestor the next   Yes [provider]      Allergies    Hydrocodone, Fenofibrate, Atorvastatin, Niacin and related, Hydrocodone-acetaminophen, Latex, and Oxycodone    Review of Systems   Review of Systems  Constitutional:  Negative for chills and fever.  HENT:  Negative for ear pain and sore throat.   Eyes:  Negative for pain and visual disturbance.  Respiratory:  Negative for cough and shortness of breath.   Cardiovascular:  Positive for chest pain. Negative for palpitations.  Gastrointestinal:  Negative for abdominal pain and vomiting.  Genitourinary:  Negative for dysuria and hematuria.  Musculoskeletal:  Negative for arthralgias and back pain.  Skin:  Negative for color change  and rash.  Neurological:  Negative for seizures and syncope.  All other systems reviewed and are negative.   Physical Exam Updated Vital Signs BP (!) 148/75   Pulse (!) 107   Temp 98.2 F (36.8 C) (Oral)   Resp (!) 23   Ht 5\' 2"  (1.575 m)   Wt 58.5 kg   SpO2 96%   BMI 23.59 kg/m  Physical Exam Vitals and nursing note reviewed.  Constitutional:      General: She is not in acute distress.    Appearance: She is well-developed.   HENT:     Head: Normocephalic and atraumatic.  Eyes:     Conjunctiva/sclera: Conjunctivae normal.  Cardiovascular:     Rate and Rhythm: Normal rate and regular rhythm.     Heart sounds: No murmur heard. Pulmonary:     Effort: Pulmonary effort is normal. No respiratory distress.     Breath sounds: Normal breath sounds.  Abdominal:     Palpations: Abdomen is soft.     Tenderness: There is no abdominal tenderness.  Musculoskeletal:        General: No swelling.     Cervical back: Neck supple.  Skin:    General: Skin is warm and dry.     Capillary Refill: Capillary refill takes less than 2 seconds.  Neurological:     Mental Status: She is alert.  Psychiatric:        Mood and Affect: Mood normal.     ED Results / Procedures / Treatments   Labs (all labs ordered are listed, but only abnormal results are displayed) Labs Reviewed  BASIC METABOLIC PANEL - Abnormal; Notable for the following components:      Result Value   Chloride 95 (*)    Glucose, Bld 103 (*)    All other components within normal limits  CBC - Abnormal; Notable for the following components:   MCV 100.5 (*)    MCH 34.2 (*)    All other components within normal limits  TROPONIN I (HIGH SENSITIVITY) - Abnormal; Notable for the following components:   Troponin I (High Sensitivity) 19 (*)    All other components within normal limits  TROPONIN I (HIGH SENSITIVITY)    EKG EKG Interpretation Date/Time:  Thursday January 24 2023 11:34:24 EST Ventricular Rate:  86 PR Interval:  140 QRS Duration:  72 QT Interval:  352 QTC Calculation: 421 R Axis:   57  Text Interpretation: Sinus rhythm with sinus arrhythmia with occasional Premature ventricular complexes Nonspecific ST abnormality Abnormal ECG When compared with ECG of 24-Jan-2023 10:21, PREVIOUS ECG IS PRESENT Confirmed by Edwin Dada ([id number]) on 01/24/2023 1:21:06 PM  Radiology DG Chest 2 View Result Date: 01/24/2023 CLINICAL DATA:  Chest pain/tightness  this morning. Recurrent symptoms more recently. EXAM: CHEST - 2 VIEW COMPARISON:  Radiographs 01/06/2021 and 03/21/2018.  CT 02/28/2021. FINDINGS: The heart size and mediastinal contours are stable status post median sternotomy and CABG. There is aortic atherosclerosis. The lungs appear stable, without acute findings. There is no pleural effusion or pneumothorax. No acute osseous findings are evident. IMPRESSION: No evidence of acute cardiopulmonary process. Stable postoperative chest. Electronically Signed   By: Carey Bullocks M.D.   On: 01/24/2023 14:41    Procedures Procedures    Medications Ordered in ED Medications  metoprolol tartrate (LOPRESSOR) injection 2.5 mg (2.5 mg Intravenous Not Given 01/24/23 1455)    ED Course/ Medical Decision Making/ A&P  Medical Decision Making Amount and/or Complexity of Data Reviewed Labs: ordered. Radiology: ordered.  Risk Prescription drug management.   21:13 PM 89 year old female with past medical history of hypertension, cardial infarction, hyperlipidemia, and coronary artery disease presenting for complaints of chest tightness.  Patient is alert and oriented x 3, no acute distress, afebrile, stable vital signs.  Physical exam demonstrates equal bilateral breath sounds with no adventitious lung sounds.  No cardiac murmurs auscultated.  Blood pressure 165/78.  No lower extremity edema.  EKG as interpreted by myself demonstrates sinus rhythm with PVC.  Stable intervals.  No ST segment elevation.  Initial troponin stable.  Repeat troponin pending.  Electrolytes stable.  Chest x-ray stable.  No pneumothorax, pleural effusion, or pneumonia.  Patient is hypertensive.  Did not take her home dose of Lopressor 25 mg tablet. Will give now.  Patient signed out to oncoming provider while awaiting repeat troponin.    Final Clinical Impression(s) / ED Diagnoses Final diagnoses:  Chest tightness  Secondary hypertension     Rx / DC Orders ED Discharge Orders     None         Franne Forts, DO 01/24/23 1412    Franne Forts, DO 01/24/23 1514

## 2023-01-24 NOTE — Telephone Encounter (Signed)
  Chief Complaint: Chest pain Symptoms: Chest pain mid chest "Really tight" Radiates down left arm. Comes and goes. Duration 30 minutes. None presently. Frequency: This AM Pertinent Negatives: Patient denies  Disposition: [x] ED /[] Urgent Care (no appt availability in office) / [] Appointment(In office/virtual)/ []  [place] [facility]/ [] Home Care/ [] Refused Recommended Disposition /[] [place] [facility]/ []  Follow-up with PCP Additional Notes: Advised and offered to call 911, declines. States her son is close by, will call him to transport. Will CB to ensure she is going to ED. Called on community line. Add: Spoke with pt, states son is on his way, close by. Reason for Disposition  [1] Chest pain (or "angina") comes and goes AND [2] is happening more often (increasing in frequency) or getting worse (increasing in severity)  (Exception: Chest pains that last only a few seconds.)  Answer Assessment - Initial Assessment Questions 1. LOCATION: "Where does it hurt?"       Middle 2. RADIATION: "Does the pain go anywhere else?" (e.g., into neck, jaw, arms, back)     Left arm 3. ONSET: "When did the chest pain begin?" (Minutes, hours or days)      This AM 4. PATTERN: "Does the pain come and go, or has it been constant since it started?"  "Does it get worse with exertion?"      Comes and goes 5. DURATION: "How long does it last" (e.g., seconds, minutes, hours)     30 minutes 6. SEVERITY: "How bad is the pain?"  (e.g., Scale 1-10; mild, moderate, or severe)    - MILD (1-3): doesn't interfere with normal activities     - MODERATE (4-7): interferes with normal activities or awakens from sleep    - SEVERE (8-10): excruciating pain, unable to do any normal activities        7. CARDIAC RISK FACTORS: "Do you have any history of heart problems or risk factors for heart disease?" (e.g., angina, prior heart attack; diabetes, high blood pressure, high cholesterol, smoker, or strong family history of  heart disease)     yes       9. CAUSE: "What do you think is causing the chest pain?"     Unsure 10. OTHER SYMPTOMS: "Do you have any other symptoms?" (e.g., dizziness, nausea, vomiting, sweating, fever, difficulty breathing, cough)       No  Protocols used: Chest Pain-A-AH

## 2023-01-25 ENCOUNTER — Telehealth: Payer: Self-pay | Admitting: Cardiology

## 2023-01-25 ENCOUNTER — Ambulatory Visit: Payer: Medicare Other | Attending: Cardiology | Admitting: Cardiology

## 2023-01-25 ENCOUNTER — Encounter: Payer: Self-pay | Admitting: Cardiology

## 2023-01-25 VITALS — BP 102/56 | HR 72 | Resp 16 | Ht 62.0 in | Wt 126.0 lb

## 2023-01-25 DIAGNOSIS — Z9889 Other specified postprocedural states: Secondary | ICD-10-CM

## 2023-01-25 DIAGNOSIS — I6523 Occlusion and stenosis of bilateral carotid arteries: Secondary | ICD-10-CM | POA: Diagnosis not present

## 2023-01-25 DIAGNOSIS — R072 Precordial pain: Secondary | ICD-10-CM

## 2023-01-25 DIAGNOSIS — Z951 Presence of aortocoronary bypass graft: Secondary | ICD-10-CM

## 2023-01-25 DIAGNOSIS — Z95828 Presence of other vascular implants and grafts: Secondary | ICD-10-CM

## 2023-01-25 DIAGNOSIS — I25118 Atherosclerotic heart disease of native coronary artery with other forms of angina pectoris: Secondary | ICD-10-CM

## 2023-01-25 DIAGNOSIS — E782 Mixed hyperlipidemia: Secondary | ICD-10-CM | POA: Diagnosis not present

## 2023-01-25 DIAGNOSIS — R002 Palpitations: Secondary | ICD-10-CM

## 2023-01-25 DIAGNOSIS — I10 Essential (primary) hypertension: Secondary | ICD-10-CM | POA: Diagnosis not present

## 2023-01-25 MED ORDER — LOSARTAN POTASSIUM 50 MG PO TABS: 50.0000 mg | ORAL_TABLET | Freq: Every day | ORAL | 3 refills | Status: DC

## 2023-01-25 NOTE — Telephone Encounter (Signed)
Patietnt went to hospital on yesterday and the dr told her follow with our office in 2/3 days. But there is nothing. Patient want to speak to the dr. Please [name]

## 2023-01-25 NOTE — Telephone Encounter (Signed)
Spoke with pt over the phone and she is not currently having any CP. Pt scheduled for a f/u appt with Dr. Odis Hollingshead today (12/20) at 1:30 pm post-ED visit.

## 2023-01-25 NOTE — Progress Notes (Signed)
Cardiology Office Note:  .   Date:  01/25/2023  ID:  [name], [name] 06/13/1933, MRN [id number] PCP:  Irena Reichmann, DO  Former Cardiology Providers: Altamese [place], MSN, APRN, FNP-C  [facility] Providers Cardiologist:  None , [facility] (established care April 2021) Electrophysiologist:  None  Click to update primary MD,subspecialty MD or APP then REFRESH:1}    Chief Complaint  Patient presents with   Coronary artery disease involving native coronary artery of   Follow-up   Palpitations    History of Present Illness: .   [name] is a 89 y.o. Caucasian female whose past medical history and cardiovascular risk factors includes: Established coronary artery disease without angina pectoris, status post three-vessel CABG (LIMA to LAD, SVG to OM, SVG to PDA) in 2007, mixed hyperlipidemia, carotid artery disease with history of right carotid endarterectomy and carotid stent, postmenopausal female, advanced age.   Patient follows with our practice given her history of CAD and three-vessel CABG in the past.  She was last seen in the office in January 2024.  Presents today for sick visit after ED visit yesterday 01/24/2023 for chest pain and tachycardia.  Yesterday's EKG did not illustrate STEMI.  She did have narrow complex tachycardia predominantly sinus tach with intermittent episodes of PSVT.  Given her symptoms of chest pain, risk factors, slight elevation in high sensitive troponins she was recommended inpatient evaluation but chose to be discharged and follow-up as outpatient.  Patient was called in for an office visit today to be reevaluated.  She is accompanied by her son.  Patient states that yesterday morning she had substernal chest pain, 4 out of 10 in intensity, radiating to the left arm, pressure-like sensation.  She went to the hospital for further evaluation and management.  After prolonged waiting time in the emergency room department she  decided to go home and follow-up as outpatient.  Since coming back from the ED she has not had any reoccurrence of chest pain.  Prior to her bypass surgery in 2007 her anginal equivalents is bilateral shoulder pain.  Patient states that she also has noticed reduced functional capacity-gets more short of breath going up 1 flight of stairs and that is not her baseline.  Review of Systems: .   Review of Systems  Cardiovascular:  Positive for chest pain (last episode 01/24/2023) and palpitations (last episode 01/24/2023). Negative for claudication, irregular heartbeat, leg swelling, near-syncope, orthopnea, paroxysmal nocturnal dyspnea and syncope.  Respiratory:  Negative for shortness of breath.   Hematologic/Lymphatic: Negative for bleeding problem.  Musculoskeletal:        Ecchymosis of the IV sites from yesterday's ER visit    Studies Reviewed:   EKG: EKG Interpretation Date/Time:  Friday January 25 2023 13:54:23 EST Ventricular Rate:  70 PR Interval:  150 QRS Duration:  80 QT Interval:  386 QTC Calculation: 416 R Axis:   24  Text Interpretation: Normal sinus rhythm Normal ECG When compared with ECG of 24-Jan-2023 19:14, ST depression in Anterolateral leads have now resolved. Confirmed by Tessa Lerner [phone]) on 01/25/2023 1:57:01 PM  Echocardiogram: 02/15/2022: Normal LV systolic function with visual EF 55-60%. Left ventricle cavity is normal in size. Normal left ventricular wall thickness. Normal global wall motion. Normal diastolic filling pattern, normal LAP.  Mild (Grade I) mitral regurgitation. Trace tricuspid regurgitation. No evidence of pulmonary hypertension. Compared to 03/22/2018 no significant change.    Stress Testing:  08/18/2012: Myoview stress test reported normal isotope uptake in both rest  and stress images.  No evidence of ischemia or scar.  LVEF by gated SPECT 88%.   Carotid Duplex 08/2017: Right Carotid: There is no evidence of stenosis in the right ICA.  Patent stent without evidence of restenosis.  Left Carotid: Velocities in the left ICA are consistent with a 1-39% stenosis. The ECA appears >50% stenosed. Vertebrals:  Bilateral vertebral arteries demonstrate antegrade flow.  Subclavians: Normal flow hemodynamics were seen in bilateral subclavian arteries.   RADIOLOGY: NA  Risk Assessment/Calculations:   NA   Labs:       Latest Ref Rng & Units 01/24/2023   11:02 AM 01/06/2021    4:35 PM 10/07/2018    9:59 AM  CBC  WBC 4.0 - 10.5 K/uL 6.1  7.0  5.1   Hemoglobin 12.0 - 15.0 g/dL 10.2  58.5  27.7   Hematocrit 36.0 - 46.0 % 44.0  43.9  41.1   Platelets 150 - 400 K/uL 315  356  222        Latest Ref Rng & Units 01/24/2023   11:02 AM 01/06/2021    4:35 PM 01/06/2021    9:39 AM  BMP  Glucose 70 - 99 mg/dL 824  235  361   BUN 8 - 23 mg/dL 17  12  13    Creatinine 0.44 - 1.00 mg/dL 4.43  1.54  0.08   Sodium 135 - 145 mmol/L 136  134  132   Potassium 3.5 - 5.1 mmol/L 3.7  4.7  6.0   Chloride 98 - 111 mmol/L 95  94  95   CO2 22 - 32 mmol/L 29  30  25    Calcium 8.9 - 10.3 mg/dL 9.1  9.5  8.9       Latest Ref Rng & Units 01/24/2023   11:02 AM 01/06/2021    4:35 PM 01/06/2021    9:39 AM  CMP  Glucose 70 - 99 mg/dL 676  195  093   BUN 8 - 23 mg/dL 17  12  13    Creatinine 0.44 - 1.00 mg/dL 2.67  1.24  5.80   Sodium 135 - 145 mmol/L 136  134  132   Potassium 3.5 - 5.1 mmol/L 3.7  4.7  6.0   Chloride 98 - 111 mmol/L 95  94  95   CO2 22 - 32 mmol/L 29  30  25    Calcium 8.9 - 10.3 mg/dL 9.1  9.5  8.9   Total Protein 6.5 - 8.1 g/dL  7.5    Total Bilirubin 0.3 - 1.2 mg/dL  0.7    Alkaline Phos 38 - 126 U/L  61    AST 15 - 41 U/L  59    ALT 0 - 44 U/L  62      No results found for: "CHOL", "HDL", "LDLCALC", "LDLDIRECT", "TRIG", "CHOLHDL" No results for input(s): "LIPOA" in the last 8760 hours. No components found for: "NTPROBNP" No results for input(s): "PROBNP" in the last 8760 hours. Recent Labs    01/24/23 1947  TSH 1.631     External Labs: Collected: 03/15/2022 provided by primary physician Hemoglobin 14.4 g/dL, hematocrit 99.8% AST 20, ALT 28, alkaline phosphatase 54. BUN 16, creatinine 0.79. Sodium 141, potassium 3.9, chloride 100, bicarb 30 GFR >60 A1c 5.4. Total cholesterol 154, triglycerides 135, calculated LDL 72, HDL 55, non-HDL 99  Physical Exam:    Today's Vitals   01/25/23 1349  BP: (!) 102/56  Pulse: 72  Resp: 16  SpO2: 93%  Weight:  126 lb (57.2 kg)  Height: 5\' 2"  (1.575 m)   Body mass index is 23.05 kg/m. Wt Readings from Last 3 Encounters:  01/25/23 126 lb (57.2 kg)  01/24/23 129 lb (58.5 kg)  02/22/22 136 lb 6.4 oz (61.9 kg)    Physical Exam  Constitutional: No distress.  hemodynamically stable  Neck: No JVD present.  Cardiovascular: Normal rate, regular rhythm, S1 normal and S2 normal. Exam reveals no gallop, no S3 and no S4.  No murmur heard. Pulmonary/Chest: No stridor. She has no wheezes. She has no rales.  Decreased breath sounds bilaterally.  Likely poor inspiratory effort  Abdominal: Soft. Bowel sounds are normal. She exhibits no distension. There is no abdominal tenderness.  Musculoskeletal:        General: No edema.     Cervical back: Neck supple.  Neurological: She is alert and oriented to person, place, and time. She has intact cranial nerves (2-12).  Skin: Skin is warm.  Ecchymosis present at the right forearm-IV sites     Impression & Recommendation(s):  Impression:   ICD-10-CM   1. Coronary artery disease involving native coronary artery of native heart with other form of angina pectoris (HCC)  I25.118 EKG 12-Lead    Cardiac Stress Test: Informed Consent Details: Physician/Practitioner Attestation; Transcribe to consent form and obtain patient signature    2. Hx of CABG  Z95.1 Cardiac Stress Test: Informed Consent Details: Physician/Practitioner Attestation; Transcribe to consent form and obtain patient signature    3. Precordial pain  R07.2  ECHOCARDIOGRAM COMPLETE    MYOCARDIAL PERFUSION IMAGING    Cardiac Stress Test: Informed Consent Details: Physician/Practitioner Attestation; Transcribe to consent form and obtain patient signature    4. Palpitations  R00.2     5. Essential hypertension  I10 losartan (COZAAR) 50 MG tablet    DISCONTINUED: losartan (COZAAR) 50 MG tablet    6. Carotid stenosis, bilateral  I65.23     7. History of right-sided carotid endarterectomy  Z98.890     8. History of right carotid artery stent placement  Z98.890    Z95.828     9. Mixed hyperlipidemia  E78.2        Recommendation(s):  Coronary artery disease involving native coronary artery of native heart with other form of angina pectoris (HCC) Hx of CABG Precordial pain Presented to the ED on 01/24/2023 for precordial chest pain concerning for angina. High sensitive troponin slightly above normal limits-delta change suggestive of ACS EKG showed sinus tachycardia as well as narrow complex tachycardia probably suggestive of PSVT, likely AVNRT. She has not had any reoccurrence of chest pain after being discharged from the ED. EKG today shows sinus rhythm without myocardial injury pattern. Given her symptoms, CAD with history of CABG and concerned about obstructive CAD and graft closure. Despite her advanced age she is quite independent. Recommended left heart catheterization with possible intervention given the pretest probability for obstructive CAD is high.  However, after discussing the risks, benefits, alternatives, and alternatives she would like to proceed with stress test for now. Cardiac PET/CT appointment is not available until February 2025 and therefore we will proceed forward Lexiscan. Echo will be ordered to evaluate for structural heart disease and left ventricular systolic function. In the interim, patient is advised to go to the closest ER via EMS if she has new onset of chest pain or worsening dyspnea on exertion.  Both patient  and son verbalized understanding Unable to uptitrate antianginal therapy due to soft blood pressures. She is  currently on Hyzaar 50-12.5 mg p.o. daily. Will discontinue the hydrochlorothiazide component so that we have some blood pressure room to uptitrate antianginal therapy.  Will give a new prescription for losartan alone at 50 mg p.o. daily. Continue antiplatelet therapy. Continue Crestor 20 mg every other day as well as Zetia 10 mg p.o. daily. Further recommendations to follow  Palpitations Secondary to sinus tachycardia versus narrow complex tachycardia suggestive of PSVT/AVNRT Currently on Lopressor 25 mg p.o. twice daily. Due to soft blood pressures unable to uptitrate AV nodal blocking agents at this time. Conservative management for now  Essential hypertension Medication changes as discussed above  Carotid stenosis, bilateral History of right-sided carotid endarterectomy History of right carotid artery stent placement Follows with vascular surgery. Continue antiplatelet therapy and lipid-lowering agents.  Orders Placed:  Orders Placed This Encounter  Procedures   Cardiac Stress Test: Informed Consent Details: Physician/Practitioner Attestation; Transcribe to consent form and obtain patient signature    Physician/Practitioner attestation of informed consent for procedure/surgical case:   I, the physician/practitioner, attest that I have discussed with the patient the benefits, risks, side effects, alternatives, likelihood of achieving goals and potential problems during recovery for the procedure that I have provided informed consent.    Procedure:   Nuclear Stress Test    Indication/Reason:   CAD, chest pain, CABG   MYOCARDIAL PERFUSION IMAGING    Standing Status:   Future    Expiration Date:   01/25/2024    Patient weight in lbs:   126    Where should this be performed?:   [facility]    Type of stress:   Lexiscan   EKG 12-Lead   ECHOCARDIOGRAM  COMPLETE    Standing Status:   Future    Expected Date:   02/01/2023    Expiration Date:   01/25/2024    Where should this test be performed:   [facility])    Does the patient weigh less than or greater than 250 lbs?:   Patient weighs less than 250 lbs    Perflutren DEFINITY (image enhancing agent) should be administered unless hypersensitivity or allergy exist:   Administer Perflutren    Reason for exam-Echo:   Other-Full Diagnosis List    Full ICD-10/Reason for Exam:   Precordial pain [786.51.ICD-9-CM]   As part of today sick visit reviewed the recent ECGs from yesterday 01/24/2023, ER documentation from 01/24/2023, labs from 01/24/2023.   Final Medication List:    Meds ordered this encounter  Medications   DISCONTD: losartan (COZAAR) 50 MG tablet    Sig: Take 1 tablet (50 mg total) by mouth daily.    Dispense:  90 tablet    Refill:  3   losartan (COZAAR) 50 MG tablet    Sig: Take 1 tablet (50 mg total) by mouth daily. HOLD medication if systolic blood pressure (top number) is less than 110.    Dispense:  90 tablet    Refill:  3    Medications Discontinued During This Encounter  Medication Reason   losartan-hydrochlorothiazide (HYZAAR) 50-12.5 MG tablet Discontinued by provider   losartan (COZAAR) 50 MG tablet      Current Outpatient Medications:    acetaminophen (TYLENOL) 500 MG tablet, Take 500 mg by mouth every 6 (six) hours as needed for moderate pain or headache., Disp: , Rfl:    aspirin 81 MG tablet, Take 81 mg by mouth daily., Disp: , Rfl:    brimonidine (ALPHAGAN) 0.2 %  ophthalmic solution, Place 1 drop into the left eye 2 (two) times daily., Disp: , Rfl:    Calcium Carbonate-Vit D-Min (CALCIUM 600+D PLUS MINERALS) 600-400 MG-UNIT TABS, Take 1 tablet by mouth daily., Disp: , Rfl:    citalopram (CELEXA) 20 MG tablet, Take 20 mg by mouth daily., Disp: , Rfl:    cyanocobalamin (VITAMIN B12) 1000 MCG tablet, Take 1,000 mcg by mouth daily., Disp: ,  Rfl:    denosumab (PROLIA) 60 MG/ML SOSY injection, Inject 60 mg into the skin every 6 (six) months. , Disp: , Rfl:    ezetimibe (ZETIA) 10 MG tablet, Take 10 mg by mouth every other day. Alternates zetia one day and crestor the next, Disp: , Rfl:    latanoprost (XALATAN) 0.005 % ophthalmic solution, Place 1 drop into the right eye at bedtime., Disp: , Rfl:    losartan (COZAAR) 50 MG tablet, Take 1 tablet (50 mg total) by mouth daily. HOLD medication if systolic blood pressure (top number) is less than 110., Disp: 90 tablet, Rfl: 3   metoprolol tartrate (LOPRESSOR) 25 MG tablet, Take 1 tablet (25 mg total) by mouth 2 (two) times daily., Disp: , Rfl:    Multiple Vitamins-Minerals (PRESERVISION AREDS 2 PO), Take 2 capsules by mouth daily., Disp: , Rfl:    primidone (MYSOLINE) 50 MG tablet, Take 50 mg by mouth at bedtime., Disp: , Rfl:    rosuvastatin (CRESTOR) 20 MG tablet, Take 20 mg by mouth every other day. Alternates zetia one day and crestor the next, Disp: , Rfl:   Consent:   Informed Consent   Shared Decision Making/Informed Consent The risks [chest pain, shortness of breath, cardiac arrhythmias, dizziness, blood pressure fluctuations, myocardial infarction, stroke/transient ischemic attack, nausea, vomiting, allergic reaction, radiation exposure, metallic taste sensation and life-threatening complications (estimated to be 1 in 10,000)], benefits (risk stratification, diagnosing coronary artery disease, treatment guidance) and alternatives of a nuclear stress test were discussed in detail with [name] and she agrees to proceed.     Disposition:   6 weeks sooner if needed.  Patient may be asked to follow-up sooner based on the results of the above-mentioned testing.  Her questions and concerns were addressed to her satisfaction. She voices understanding of the recommendations provided during this encounter.    Signed, Tessa Lerner, DO, [facility]  [facility]  [address] [place], [place] [address] 01/25/2023 5:10 PM

## 2023-01-25 NOTE — Patient Instructions (Signed)
Medication Instructions:  Your physician has recommended you make the following change in your medication:   STOP Losartan-Hydrochlorothiazide (Hyzaar)  START Losartan 50 mg once daily - HOLD if systolic blood pressure (top number) is less than 110   *If you need a refill on your cardiac medications before your next appointment, please call your pharmacy*  Lab Work: None ordered today. If you have labs (blood work) drawn today and your tests are completely normal, you will receive your results only by: MyChart Message (if you have MyChart) OR A paper copy in the mail If you have any lab test that is abnormal or we need to change your treatment, we will call you to review the results.  Testing/Procedures: Your physician has requested that you have an echocardiogram. Echocardiography is a painless test that uses sound waves to create images of your heart. It provides your doctor with information about the size and shape of your heart and how well your heart's chambers and valves are working. This procedure takes approximately one hour. There are no restrictions for this procedure. Please do NOT wear cologne, perfume, aftershave, or lotions (deodorant is allowed). Please arrive 15 minutes prior to your appointment time.  Please note: We ask at that you not bring children with you during ultrasound (echo/ vascular) testing. Due to room size and safety concerns, children are not allowed in the ultrasound rooms during exams. Our front office staff cannot provide observation of children in our lobby area while testing is being conducted. An adult accompanying a patient to their appointment will only be allowed in the ultrasound room at the discretion of the ultrasound technician under special circumstances. We apologize for any inconvenience.   Follow-Up: At [facility], you and your health needs are our priority.  As part of our continuing mission to provide you with exceptional heart care, we  have created designated Provider Care Teams.  These Care Teams include your primary Cardiologist (physician) and Advanced Practice Providers (APPs -  Physician Assistants and Nurse Practitioners) who all work together to provide you with the care you need, when you need it.  We recommend signing up for the patient portal called "MyChart".  Sign up information is provided on this After Visit Summary.  MyChart is used to connect with patients for Virtual Visits (Telemedicine).  Patients are able to view lab/test results, encounter notes, upcoming appointments, etc.  Non-urgent messages can be sent to your provider as well.   To learn more about what you can do with MyChart, go to [email or web address].    Your next appointment:   6 week(s)  The format for your next appointment:   In Person  Provider:   Dr. Odis Hollingshead or APP  Other Instructions We will be scheduling you for either a Cardiac PET/CT scan or a Lexiscan Nuclear Stress Test depending on which we could get you in faster to complete. Someone will be reaching out to you to schedule.

## 2023-01-28 ENCOUNTER — Telehealth (HOSPITAL_COMMUNITY): Payer: Self-pay | Admitting: *Deleted

## 2023-01-28 NOTE — Telephone Encounter (Signed)
Patient given detailed instructions per Myocardial Perfusion Study Information Sheet for the test on 01/31/23 Patient notified to arrive 15 minutes early and that it is imperative to arrive on time for appointment to keep from having the test rescheduled.  If you need to cancel or reschedule your appointment, please call the office within 24 hours of your appointment. . Patient verbalized understanding.[name]

## 2023-01-31 ENCOUNTER — Ambulatory Visit (HOSPITAL_COMMUNITY): Payer: Medicare Other | Attending: Internal Medicine

## 2023-01-31 DIAGNOSIS — R072 Precordial pain: Secondary | ICD-10-CM | POA: Diagnosis not present

## 2023-01-31 LAB — MYOCARDIAL PERFUSION IMAGING
Base ST Depression (mm): 0 mm
LV dias vol: 29 mL (ref 46–106)
LV sys vol: 5 mL
Nuc Stress EF: 81 %
Peak HR: 113 {beats}/min
Rest HR: 81 {beats}/min
Rest Nuclear Isotope Dose: 10.2 mCi
SDS: 1
SRS: 1
SSS: 2
ST Depression (mm): 0 mm
Stress Nuclear Isotope Dose: 30.8 mCi
TID: 0.95

## 2023-01-31 MED ORDER — REGADENOSON 0.4 MG/5ML IV SOLN
0.4000 mg | Freq: Once | INTRAVENOUS | Status: AC
  Administered 2023-01-31: 0.4 mg via INTRAVENOUS

## 2023-01-31 MED ORDER — TECHNETIUM TC 99M TETROFOSMIN IV KIT
30.8000 | PACK | Freq: Once | INTRAVENOUS | Status: AC | PRN
Start: 2023-01-31 — End: 2023-01-31
  Administered 2023-01-31: 30.8 via INTRAVENOUS

## 2023-01-31 MED ORDER — TECHNETIUM TC 99M TETROFOSMIN IV KIT
10.2000 | PACK | Freq: Once | INTRAVENOUS | Status: AC | PRN
  Administered 2023-01-31: 10.2 via INTRAVENOUS

## 2023-02-07 DIAGNOSIS — M25552 Pain in left hip: Secondary | ICD-10-CM | POA: Diagnosis not present

## 2023-02-17 DIAGNOSIS — M5136 Other intervertebral disc degeneration, lumbar region with discogenic back pain only: Secondary | ICD-10-CM | POA: Diagnosis not present

## 2023-02-17 DIAGNOSIS — M25552 Pain in left hip: Secondary | ICD-10-CM | POA: Diagnosis not present

## 2023-02-19 DIAGNOSIS — M25552 Pain in left hip: Secondary | ICD-10-CM | POA: Diagnosis not present

## 2023-02-21 ENCOUNTER — Ambulatory Visit (HOSPITAL_COMMUNITY): Payer: Medicare Other | Attending: Internal Medicine

## 2023-02-21 DIAGNOSIS — R072 Precordial pain: Secondary | ICD-10-CM | POA: Diagnosis not present

## 2023-02-22 LAB — ECHOCARDIOGRAM COMPLETE
Area-P 1/2: 4.64 cm2
S' Lateral: 2.5 cm

## 2023-02-27 ENCOUNTER — Encounter: Payer: Self-pay | Admitting: Cardiology

## 2023-02-27 ENCOUNTER — Ambulatory Visit: Payer: Medicare Other | Attending: Cardiology | Admitting: Cardiology

## 2023-02-27 ENCOUNTER — Telehealth: Payer: Self-pay | Admitting: Cardiology

## 2023-02-27 VITALS — BP 120/68 | HR 73 | Resp 16 | Ht 62.0 in | Wt 116.4 lb

## 2023-02-27 DIAGNOSIS — Z9889 Other specified postprocedural states: Secondary | ICD-10-CM | POA: Diagnosis not present

## 2023-02-27 DIAGNOSIS — Z95828 Presence of other vascular implants and grafts: Secondary | ICD-10-CM | POA: Diagnosis not present

## 2023-02-27 DIAGNOSIS — E782 Mixed hyperlipidemia: Secondary | ICD-10-CM | POA: Diagnosis not present

## 2023-02-27 DIAGNOSIS — Z951 Presence of aortocoronary bypass graft: Secondary | ICD-10-CM | POA: Diagnosis not present

## 2023-02-27 DIAGNOSIS — I6523 Occlusion and stenosis of bilateral carotid arteries: Secondary | ICD-10-CM | POA: Insufficient documentation

## 2023-02-27 DIAGNOSIS — I10 Essential (primary) hypertension: Secondary | ICD-10-CM | POA: Insufficient documentation

## 2023-02-27 DIAGNOSIS — I25118 Atherosclerotic heart disease of native coronary artery with other forms of angina pectoris: Secondary | ICD-10-CM | POA: Diagnosis not present

## 2023-02-27 MED ORDER — ISOSORBIDE MONONITRATE ER 30 MG PO TB24: 30.0000 mg | ORAL_TABLET | Freq: Every evening | ORAL | 3 refills | Status: AC

## 2023-02-27 MED ORDER — LOSARTAN POTASSIUM-HCTZ 50-12.5 MG PO TABS: 1.0000 | ORAL_TABLET | Freq: Every morning | ORAL | 3 refills | Status: DC

## 2023-02-27 NOTE — Patient Instructions (Addendum)
Medication Instructions:  Stop Losartan 50 mg  Changed to Losartan-hydrochlorothiazide 50-12.5 mg one tablet in the morning. Start Imdur 30 mg one tablet nightly   *If you need a refill on your cardiac medications before your next appointment, please call your pharmacy*   Lab Work: None  If you have labs (blood work) drawn today and your tests are completely normal, you will receive your results only by: MyChart Message (if you have MyChart) OR A paper copy in the mail If you have any lab test that is abnormal or we need to change your treatment, we will call you to review the results.   Testing/Procedures: none   Follow-Up: At [facility], you and your health needs are our priority.  As part of our continuing mission to provide you with exceptional heart care, we have created designated Provider Care Teams.  These Care Teams include your primary Cardiologist (physician) and Advanced Practice Providers (APPs -  Physician Assistants and Nurse Practitioners) who all work together to provide you with the care you need, when you need it.  We recommend signing up for the patient portal called "MyChart".  Sign up information is provided on this After Visit Summary.  MyChart is used to connect with patients for Virtual Visits (Telemedicine).  Patients are able to view lab/test results, encounter notes, upcoming appointments, etc.  Non-urgent messages can be sent to your provider as well.   To learn more about what you can do with MyChart, go to [email or web address].    Your next appointment:   6 month(s) for CAD  Provider:   Tessa Lerner, DO

## 2023-02-27 NOTE — Telephone Encounter (Signed)
Pt was seen in office today by Odis Hollingshead and was told to cb with name of medication - losartan (COZAAR) 50-125 MG tablet 1tab by mouth daily

## 2023-02-27 NOTE — Telephone Encounter (Signed)
Information forwarded to Dr. Odis Hollingshead and medication list updated accordingly.

## 2023-02-27 NOTE — Progress Notes (Signed)
Cardiology Office Note:  .   Date:  02/27/2023  ID:  [name], [name] May 24, 1933, MRN [id number] PCP:  Irena Reichmann, DO  Former Cardiology Providers: Altamese Lavallette, MSN, APRN, FNP-C  [facility] Providers Cardiologist:  Tessa Lerner, DO , [facility] (established care April 2021) Electrophysiologist:  None  Click to update primary MD,subspecialty MD or APP then REFRESH:1}    Chief Complaint  Patient presents with   Coronary artery disease involving native coronary artery of   Follow-up    1 year    History of Present Illness: .   [name] is a 89 y.o. Caucasian female whose past medical history and cardiovascular risk factors includes: Established coronary artery disease without angina pectoris, status post three-vessel CABG (LIMA to LAD, SVG to OM, SVG to PDA) in 2007, mixed hyperlipidemia, carotid artery disease with history of right carotid endarterectomy and carotid stent, postmenopausal female, advanced age.   Patient follows with our practice given her history of CAD and three-vessel CABG in the past.     Patient presented to the office in December 2024 as a sick visit for precordial pain.  Given her symptoms, recent ER visits, and risk factors we discussed undergoing left heart catheterization with possible intervention to make sure she has no new obstructive disease and the grafts are patent.  However shared decision was to proceed forward with stress test and echocardiogram.  She presents today for follow-up.  Patient is accompanied by her son at today's office visit.  Since last office visit patient denies any anginal chest pain or heart failure symptoms.  At last office visit I had discontinued losartan/hydrochlorothiazide and transition her to losartan so that we can uptitrate antianginal therapy.  However, at home she was monitoring her blood pressures which were elevated and therefore she went back to the combination pill of  losartan/hydrochlorothiazide.  Based on her ambulatory blood pressure log and blood pressure readings are consistently higher compared to her evening blood pressure readings.  She currently takes her losartan/hydrochlorothiazide in the morning.  Of note, in 2007 when she had her surgery her anginal equivalents was bilateral shoulder pain.  Review of Systems: .   Review of Systems  Cardiovascular:  Positive for chest pain (last episode 01/24/2023) and palpitations (last episode 01/24/2023). Negative for claudication, irregular heartbeat, leg swelling, near-syncope, orthopnea, paroxysmal nocturnal dyspnea and syncope.  Respiratory:  Negative for shortness of breath.   Hematologic/Lymphatic: Negative for bleeding problem.  Musculoskeletal:        Ecchymosis of the IV sites from yesterday's ER visit    Studies Reviewed:   Echocardiogram: February 21 2023 LVEF: 65 to 70% Diastolic Function: Normal Regurgitation: Trivial MR, trivial AR Estimated RAP 3 mmHg See report for additional details    Stress Testing:  MPI: 01/31/2023  EKG showed no EKG changes during Lexiscan infusion.  Myoview scan shows normal perfusion No ischemia or scar  LVEF is normal at 81%  Overall low risk study  See report for additional details   Carotid Duplex 08/2017: Right Carotid: There is no evidence of stenosis in the right ICA. Patent stent without evidence of restenosis.  Left Carotid: Velocities in the left ICA are consistent with a 1-39% stenosis. The ECA appears >50% stenosed. Vertebrals:  Bilateral vertebral arteries demonstrate antegrade flow.  Subclavians: Normal flow hemodynamics were seen in bilateral subclavian arteries.   RADIOLOGY: NA  Risk Assessment/Calculations:   NA   Labs:       Latest Ref Rng &  Units 01/24/2023   11:02 AM 01/06/2021    4:35 PM 10/07/2018    9:59 AM  CBC  WBC 4.0 - 10.5 K/uL 6.1  7.0  5.1   Hemoglobin 12.0 - 15.0 g/dL 95.6  38.7  56.4   Hematocrit 36.0 - 46.0  % 44.0  43.9  41.1   Platelets 150 - 400 K/uL 315  356  222        Latest Ref Rng & Units 01/24/2023   11:02 AM 01/06/2021    4:35 PM 01/06/2021    9:39 AM  BMP  Glucose 70 - 99 mg/dL 332  951  884   BUN 8 - 23 mg/dL 17  12  13    Creatinine 0.44 - 1.00 mg/dL 1.66  0.63  0.16   Sodium 135 - 145 mmol/L 136  134  132   Potassium 3.5 - 5.1 mmol/L 3.7  4.7  6.0   Chloride 98 - 111 mmol/L 95  94  95   CO2 22 - 32 mmol/L 29  30  25    Calcium 8.9 - 10.3 mg/dL 9.1  9.5  8.9       Latest Ref Rng & Units 01/24/2023   11:02 AM 01/06/2021    4:35 PM 01/06/2021    9:39 AM  CMP  Glucose 70 - 99 mg/dL 010  932  355   BUN 8 - 23 mg/dL 17  12  13    Creatinine 0.44 - 1.00 mg/dL 7.32  2.02  5.42   Sodium 135 - 145 mmol/L 136  134  132   Potassium 3.5 - 5.1 mmol/L 3.7  4.7  6.0   Chloride 98 - 111 mmol/L 95  94  95   CO2 22 - 32 mmol/L 29  30  25    Calcium 8.9 - 10.3 mg/dL 9.1  9.5  8.9   Total Protein 6.5 - 8.1 g/dL  7.5    Total Bilirubin 0.3 - 1.2 mg/dL  0.7    Alkaline Phos 38 - 126 U/L  61    AST 15 - 41 U/L  59    ALT 0 - 44 U/L  62      No results found for: "CHOL", "HDL", "LDLCALC", "LDLDIRECT", "TRIG", "CHOLHDL" No results for input(s): "LIPOA" in the last 8760 hours. No components found for: "NTPROBNP" No results for input(s): "PROBNP" in the last 8760 hours. Recent Labs    01/24/23 1947  TSH 1.631    External Labs: Collected: 03/15/2022 provided by primary physician Hemoglobin 14.4 g/dL, hematocrit 70.6% AST 20, ALT 28, alkaline phosphatase 54. BUN 16, creatinine 0.79. Sodium 141, potassium 3.9, chloride 100, bicarb 30 GFR >60 A1c 5.4. Total cholesterol 154, triglycerides 135, calculated LDL 72, HDL 55, non-HDL 99  Physical Exam:    Today's Vitals   02/27/23 1412  BP: 120/68  Pulse: 73  Resp: 16  SpO2: 93%  Weight: 116 lb 6.4 oz (52.8 kg)  Height: 5\' 2"  (1.575 m)   Body mass index is 21.29 kg/m. Wt Readings from Last 3 Encounters:  02/27/23 116 lb 6.4 oz  (52.8 kg)  01/25/23 126 lb (57.2 kg)  01/24/23 129 lb (58.5 kg)    Physical Exam  Constitutional: No distress.  hemodynamically stable  Neck: No JVD present.  Cardiovascular: Normal rate, regular rhythm, S1 normal and S2 normal. Exam reveals no gallop, no S3 and no S4.  No murmur heard. Pulmonary/Chest: No stridor. She has no wheezes. She has no rales.  Decreased breath  sounds bilaterally.  Likely poor inspiratory effort  Abdominal: Soft. Bowel sounds are normal. She exhibits no distension. There is no abdominal tenderness.  Musculoskeletal:        General: No edema.     Cervical back: Neck supple.  Neurological: She is alert and oriented to person, place, and time. She has intact cranial nerves (2-12).  Skin: Skin is warm.  Ecchymosis present at the right forearm-IV sites     Impression & Recommendation(s):  Impression:   ICD-10-CM   1. Coronary artery disease involving native coronary artery of native heart with other form of angina pectoris (HCC)  I25.118 losartan-hydrochlorothiazide (HYZAAR) 50-12.5 MG tablet    isosorbide mononitrate (IMDUR) 30 MG 24 hr tablet    2. Hx of CABG  Z95.1     3. Essential hypertension  I10 losartan-hydrochlorothiazide (HYZAAR) 50-12.5 MG tablet    isosorbide mononitrate (IMDUR) 30 MG 24 hr tablet    4. Carotid stenosis, bilateral  I65.23     5. History of right-sided carotid endarterectomy  Z98.890     6. History of right carotid artery stent placement  Z98.890    Z95.828     7. Mixed hyperlipidemia  E78.2         Recommendation(s):  Coronary artery disease involving native coronary artery of native heart with other form of angina pectoris (HCC) Hx of CABG No reoccurrence of chest pain since last office visit. Patient had an MPI since last office visit which is noted to be low risk. Continue antiplatelet therapy. Continue Crestor 20 mg daily.  Zetia 10 mg p.o. daily. Start Imdur 30 mg p.o. every afternoon Continue  losartan/hydrochlorothiazide 50/12.5 mg p.o. every morning  Essential hypertension Office blood pressures are very well-controlled. Home blood pressures based on her log are not well-controlled.  A.m. readings are higher than p.m. readings. Medication changes as discussed above Monitor for now  Carotid stenosis, bilateral History of right-sided carotid endarterectomy History of right carotid artery stent placement Follows with vascular surgery. Continue antiplatelet therapy and lipid-lowering agents.  Palpitations/PSVT/AVNRT Clinically asymptomatic. Doing well on Lopressor 25 mg p.o. twice daily, recommended transitioning to Toprol-XL 50 mg p.o. daily but patient refuses.  Orders Placed:  No orders of the defined types were placed in this encounter.  As part of today sick visit reviewed the recent ECGs from yesterday 01/24/2023, ER documentation from 01/24/2023, labs from 01/24/2023.   Final Medication List:    Meds ordered this encounter  Medications   losartan-hydrochlorothiazide (HYZAAR) 50-12.5 MG tablet    Sig: Take 1 tablet by mouth in the morning.    Dispense:  90 tablet    Refill:  3   isosorbide mononitrate (IMDUR) 30 MG 24 hr tablet    Sig: Take 1 tablet (30 mg total) by mouth at bedtime.    Dispense:  90 tablet    Refill:  3    Medications Discontinued During This Encounter  Medication Reason   losartan (COZAAR) 50 MG tablet Discontinued by provider      Current Outpatient Medications:    acetaminophen (TYLENOL) 500 MG tablet, Take 500 mg by mouth every 6 (six) hours as needed for moderate pain or headache., Disp: , Rfl:    aspirin 81 MG tablet, Take 81 mg by mouth daily., Disp: , Rfl:    brimonidine (ALPHAGAN) 0.2 % ophthalmic solution, Place 1 drop into the left eye 2 (two) times daily., Disp: , Rfl:    Calcium Carbonate-Vit D-Min (CALCIUM 600+D PLUS MINERALS) 600-400 MG-UNIT  TABS, Take 1 tablet by mouth daily., Disp: , Rfl:    citalopram (CELEXA) 20 MG  tablet, Take 20 mg by mouth daily., Disp: , Rfl:    cyanocobalamin (VITAMIN B12) 1000 MCG tablet, Take 1,000 mcg by mouth daily., Disp: , Rfl:    denosumab (PROLIA) 60 MG/ML SOSY injection, Inject 60 mg into the skin every 6 (six) months. , Disp: , Rfl:    ezetimibe (ZETIA) 10 MG tablet, Take 10 mg by mouth every other day. Alternates zetia one day and crestor the next, Disp: , Rfl:    isosorbide mononitrate (IMDUR) 30 MG 24 hr tablet, Take 1 tablet (30 mg total) by mouth at bedtime., Disp: 90 tablet, Rfl: 3   latanoprost (XALATAN) 0.005 % ophthalmic solution, Place 1 drop into the right eye at bedtime., Disp: , Rfl:    losartan-hydrochlorothiazide (HYZAAR) 50-12.5 MG tablet, Take 1 tablet by mouth in the morning., Disp: 90 tablet, Rfl: 3   metoprolol tartrate (LOPRESSOR) 25 MG tablet, Take 1 tablet (25 mg total) by mouth 2 (two) times daily., Disp: , Rfl:    Multiple Vitamins-Minerals (PRESERVISION AREDS 2 PO), Take 2 capsules by mouth daily., Disp: , Rfl:    primidone (MYSOLINE) 50 MG tablet, Take 50 mg by mouth at bedtime., Disp: , Rfl:    rosuvastatin (CRESTOR) 20 MG tablet, Take 20 mg by mouth every other day. Alternates zetia one day and crestor the next, Disp: , Rfl:   Consent:   N/A     Disposition:   6 months sooner if needed  Her questions and concerns were addressed to her satisfaction. She voices understanding of the recommendations provided during this encounter.    Signed, Tessa Lerner, DO, [facility]  [facility]  [address] [place], [place] [address] 02/27/2023 6:00 PM

## 2023-02-28 ENCOUNTER — Ambulatory Visit: Payer: Medicare Other | Admitting: Cardiology

## 2023-02-28 DIAGNOSIS — M25552 Pain in left hip: Secondary | ICD-10-CM | POA: Diagnosis not present

## 2023-02-28 DIAGNOSIS — M545 Low back pain, unspecified: Secondary | ICD-10-CM | POA: Diagnosis not present

## 2023-03-14 DIAGNOSIS — M25552 Pain in left hip: Secondary | ICD-10-CM | POA: Diagnosis not present

## 2023-03-21 DIAGNOSIS — H61032 Chondritis of left external ear: Secondary | ICD-10-CM | POA: Diagnosis not present

## 2023-03-21 DIAGNOSIS — R208 Other disturbances of skin sensation: Secondary | ICD-10-CM | POA: Diagnosis not present

## 2023-03-21 DIAGNOSIS — L2989 Other pruritus: Secondary | ICD-10-CM | POA: Diagnosis not present

## 2023-03-21 DIAGNOSIS — L821 Other seborrheic keratosis: Secondary | ICD-10-CM | POA: Diagnosis not present

## 2023-03-21 DIAGNOSIS — L820 Inflamed seborrheic keratosis: Secondary | ICD-10-CM | POA: Diagnosis not present

## 2023-03-21 DIAGNOSIS — D692 Other nonthrombocytopenic purpura: Secondary | ICD-10-CM | POA: Diagnosis not present

## 2023-03-21 DIAGNOSIS — Z789 Other specified health status: Secondary | ICD-10-CM | POA: Diagnosis not present

## 2023-03-21 DIAGNOSIS — L814 Other melanin hyperpigmentation: Secondary | ICD-10-CM | POA: Diagnosis not present

## 2023-03-21 DIAGNOSIS — L538 Other specified erythematous conditions: Secondary | ICD-10-CM | POA: Diagnosis not present

## 2023-03-21 DIAGNOSIS — D225 Melanocytic nevi of trunk: Secondary | ICD-10-CM | POA: Diagnosis not present

## 2023-03-29 DIAGNOSIS — I2510 Atherosclerotic heart disease of native coronary artery without angina pectoris: Secondary | ICD-10-CM | POA: Diagnosis not present

## 2023-03-29 DIAGNOSIS — Z951 Presence of aortocoronary bypass graft: Secondary | ICD-10-CM | POA: Diagnosis not present

## 2023-03-29 DIAGNOSIS — I10 Essential (primary) hypertension: Secondary | ICD-10-CM | POA: Diagnosis not present

## 2023-03-29 DIAGNOSIS — R7309 Other abnormal glucose: Secondary | ICD-10-CM | POA: Diagnosis not present

## 2023-03-29 DIAGNOSIS — E785 Hyperlipidemia, unspecified: Secondary | ICD-10-CM | POA: Diagnosis not present

## 2023-03-29 DIAGNOSIS — I6529 Occlusion and stenosis of unspecified carotid artery: Secondary | ICD-10-CM | POA: Diagnosis not present

## 2023-04-11 DIAGNOSIS — G250 Essential tremor: Secondary | ICD-10-CM | POA: Diagnosis not present

## 2023-04-11 DIAGNOSIS — R7309 Other abnormal glucose: Secondary | ICD-10-CM | POA: Diagnosis not present

## 2023-04-11 DIAGNOSIS — I2510 Atherosclerotic heart disease of native coronary artery without angina pectoris: Secondary | ICD-10-CM | POA: Diagnosis not present

## 2023-04-11 DIAGNOSIS — I6529 Occlusion and stenosis of unspecified carotid artery: Secondary | ICD-10-CM | POA: Diagnosis not present

## 2023-04-11 DIAGNOSIS — I10 Essential (primary) hypertension: Secondary | ICD-10-CM | POA: Diagnosis not present

## 2023-04-11 DIAGNOSIS — R946 Abnormal results of thyroid function studies: Secondary | ICD-10-CM | POA: Diagnosis not present

## 2023-04-11 DIAGNOSIS — Z951 Presence of aortocoronary bypass graft: Secondary | ICD-10-CM | POA: Diagnosis not present

## 2023-04-11 DIAGNOSIS — M810 Age-related osteoporosis without current pathological fracture: Secondary | ICD-10-CM | POA: Diagnosis not present

## 2023-04-11 DIAGNOSIS — E785 Hyperlipidemia, unspecified: Secondary | ICD-10-CM | POA: Diagnosis not present

## 2023-05-01 DIAGNOSIS — Z961 Presence of intraocular lens: Secondary | ICD-10-CM | POA: Diagnosis not present

## 2023-05-01 DIAGNOSIS — H401131 Primary open-angle glaucoma, bilateral, mild stage: Secondary | ICD-10-CM | POA: Diagnosis not present

## 2023-06-05 DIAGNOSIS — M810 Age-related osteoporosis without current pathological fracture: Secondary | ICD-10-CM | POA: Diagnosis not present

## 2023-09-08 IMAGING — CT CT CHEST HIGH RESOLUTION
2 of 7 series · 14 of 36 positions shown, 17 images · non-contrast
Comparison: 01/06/2021, 03/21/2018.

CLINICAL DATA: Lung nodules, concerning diffuse idiopathic
pulmonary neuroendocrine cell hyperplasia (DIPNECH). Cough for 1
year.



[Series 4: high resolution retro · axial · 0.59mm/px · z∈[-231,+14]mm · 11 of 295 slices shown, 14 images]
[im 25/295  mediastinal]
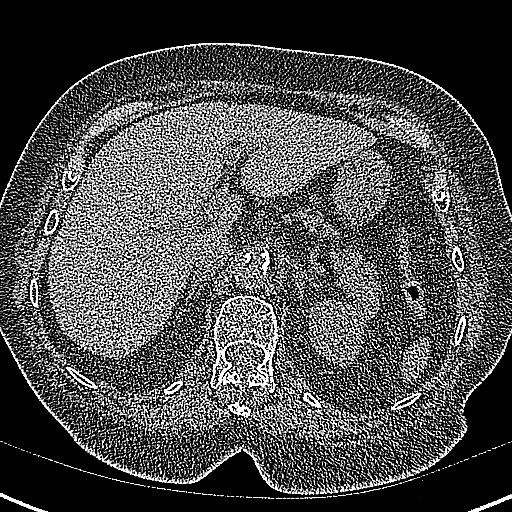
[im 25/295  lung]
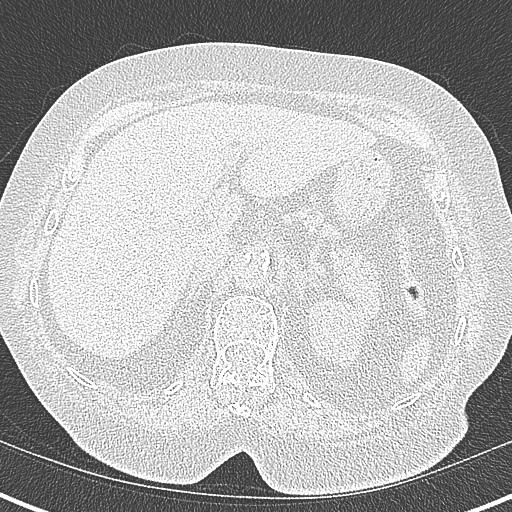
[im 50/295  lung]
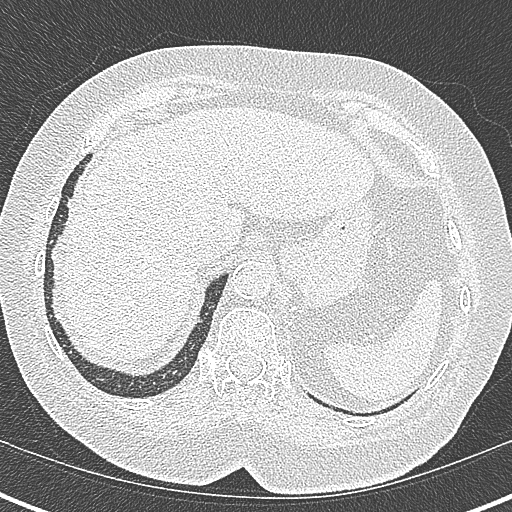
[im 74/295  lung]
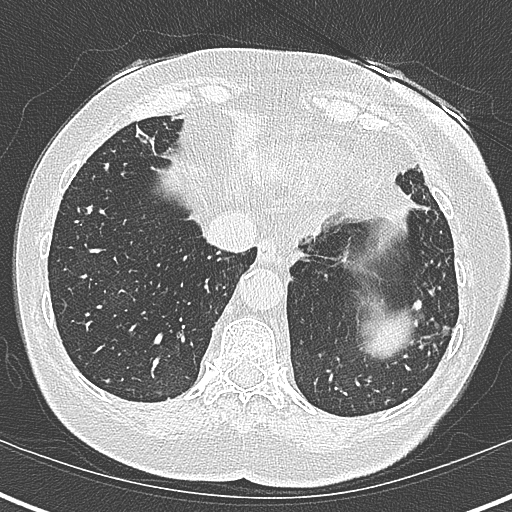
[im 99/295  lung]
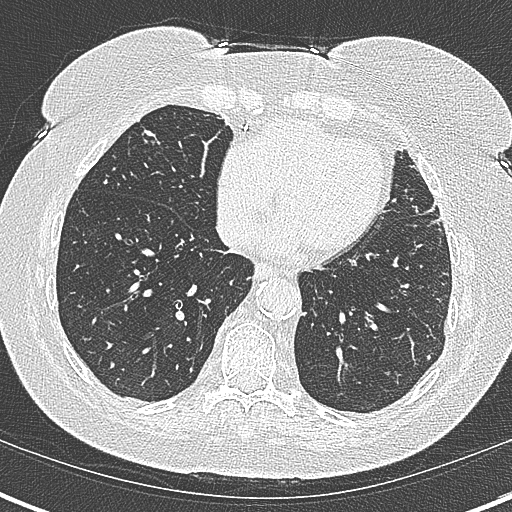
[im 123/295  mediastinal]
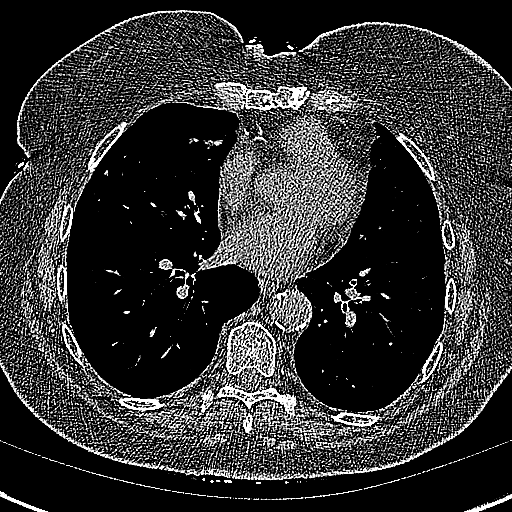
[im 123/295  lung]
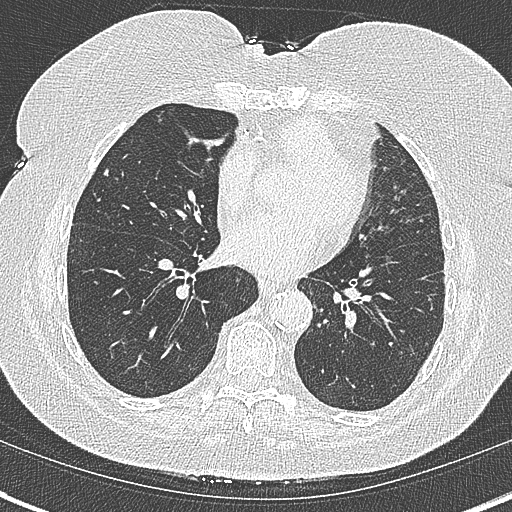
[im 148/295  lung]
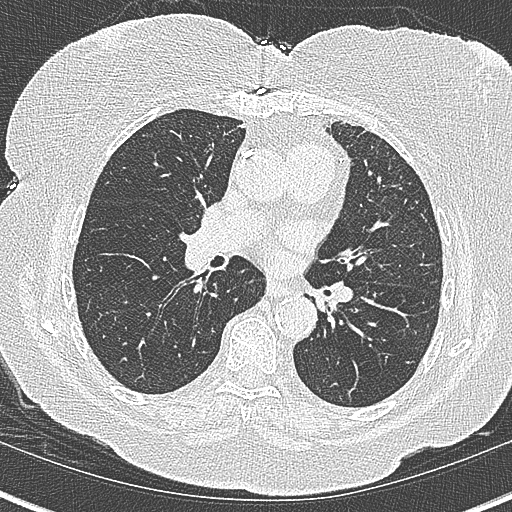
[im 172/295  lung]
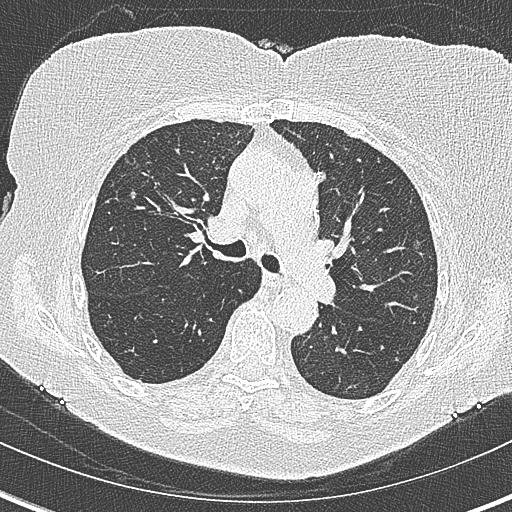
[im 197/295  lung]
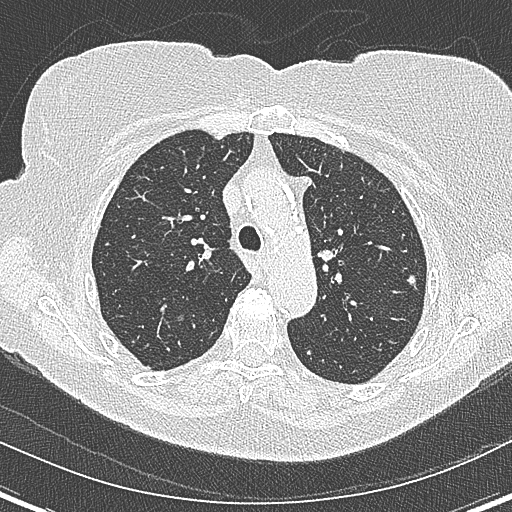
[im 221/295  mediastinal]
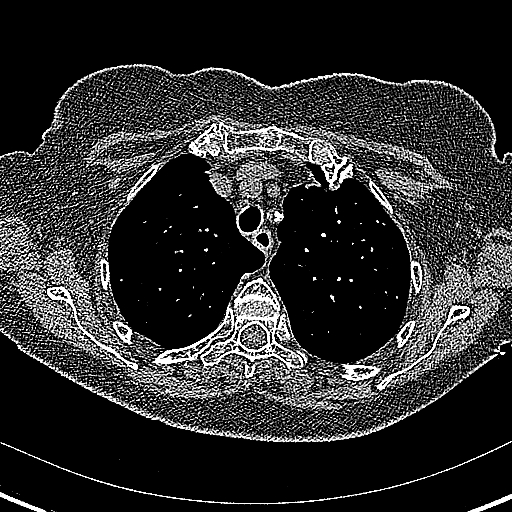
[im 221/295  lung]
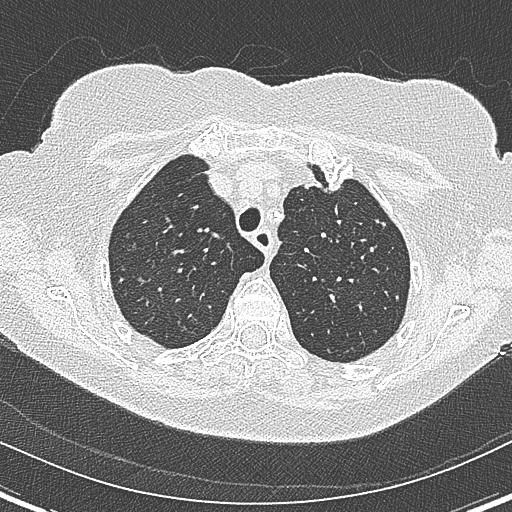
[im 246/295  lung]
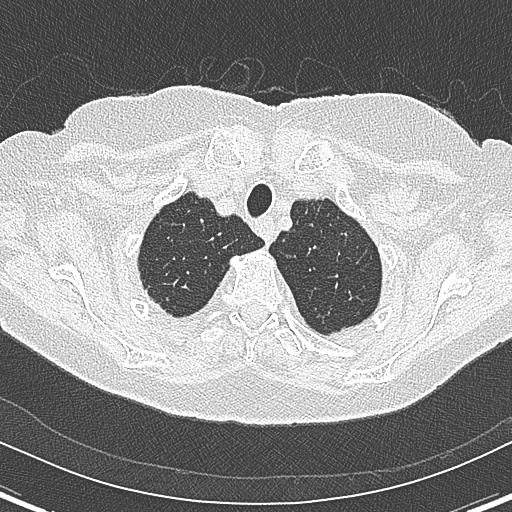
[im 270/295  lung]
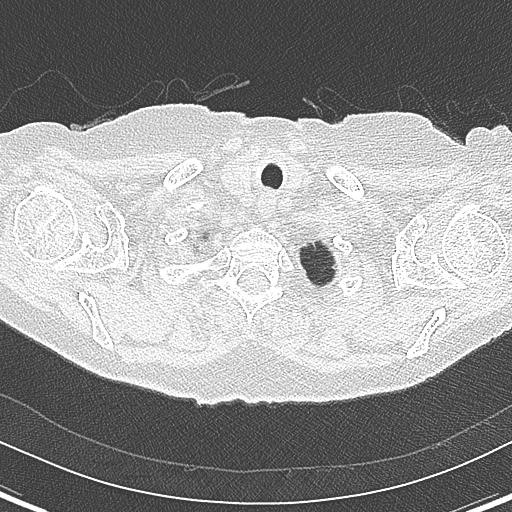

[Series 9: coronal · coronal · 0.62mm/px · 3 of 88 slices shown]
[im 18/88  lung]
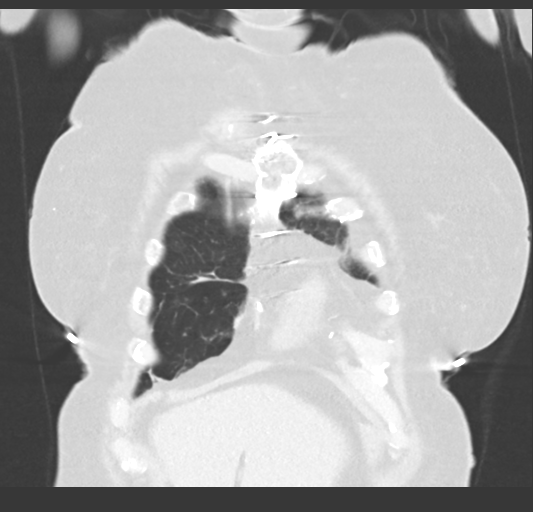
[im 35/88  lung]
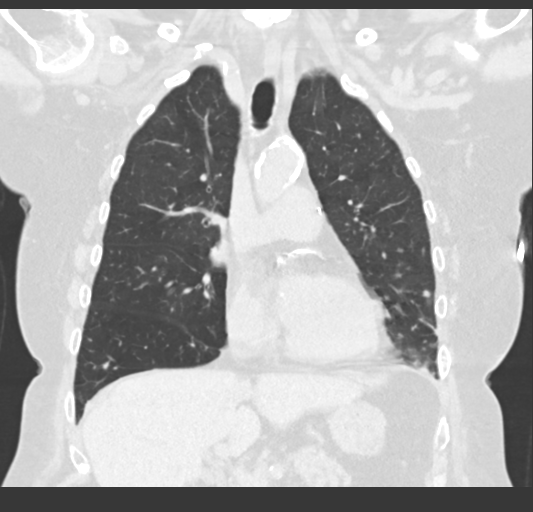
[im 53/88  lung]
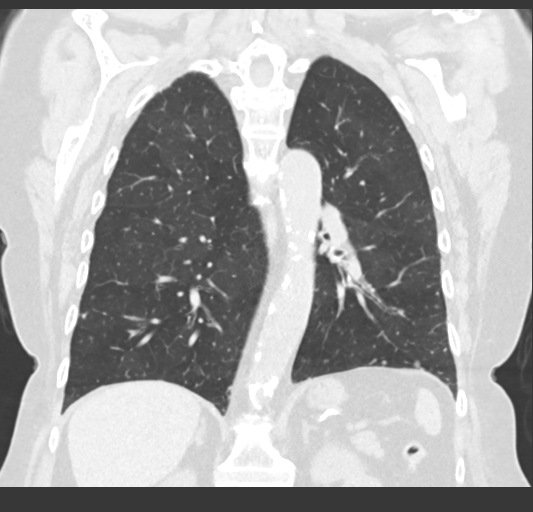

[14 of 36 positions shown; findings below may reference images not displayed]

FINDINGS: Cardiovascular: Atherosclerotic calcification of the aorta and
aortic valve. Heart size normal. No pericardial effusion.

Mediastinum/Nodes: No pathologically enlarged mediastinal or
axillary lymph nodes. Calcified mediastinal lymph nodes. Hilar
regions are difficult to definitively evaluate without IV contrast.
Esophagus is grossly unremarkable.

Lungs/Pleura: Multiple bilateral peribronchovascular pulmonary
nodules, measuring up to 7 mm in the anterior segment right upper
lobe (8/64), somewhat basilar predominant. Findings are similar to
01/06/2021. Larger nodules appear similar to 03/21/2018. Overall
micronodularity is similar to possibly minimally increased from
03/21/2018. Comparison with 03/21/2018 is challenging due to
expiratory phase imaging and respiratory motion as well as thicker
slice collimation on that study. There is no mosaic pulmonary
parenchymal attenuation, as on comparison exams. There may be
scattered bronchial wall thickening. No pleural fluid. There may be
minimal air trapping on expiratory phase imaging.

Upper Abdomen: Visualized portions of the liver, gallbladder,
adrenal glands, kidneys, spleen, pancreas, stomach and bowel are
grossly unremarkable.

Musculoskeletal: Degenerative changes in the spine. No worrisome
lytic or sclerotic lesions.
IMPRESSION: 1. Multiple scattered peribronchovascular pulmonary nodules with
somewhat of a basilar predominance, unchanged from 01/27/2019.
Findings are similar to possibly minimally progressive from
03/21/2018. Please see discussion above. Scattered bronchial wall
thickening. No corresponding mosaic attenuation. Findings may be
postinfectious or postinflammatory in etiology. DIPNECH cannot be
excluded.
2. Minimal air trapping can be seen with small airways disease.
3.  Aortic atherosclerosis ([id number]).

## 2023-09-25 DIAGNOSIS — D225 Melanocytic nevi of trunk: Secondary | ICD-10-CM | POA: Diagnosis not present

## 2023-09-25 DIAGNOSIS — L905 Scar conditions and fibrosis of skin: Secondary | ICD-10-CM | POA: Diagnosis not present

## 2023-09-25 DIAGNOSIS — D485 Neoplasm of uncertain behavior of skin: Secondary | ICD-10-CM | POA: Diagnosis not present

## 2023-09-25 DIAGNOSIS — L814 Other melanin hyperpigmentation: Secondary | ICD-10-CM | POA: Diagnosis not present

## 2023-09-25 DIAGNOSIS — L821 Other seborrheic keratosis: Secondary | ICD-10-CM | POA: Diagnosis not present

## 2023-09-26 ENCOUNTER — Telehealth: Payer: Self-pay | Admitting: Cardiology

## 2023-09-26 NOTE — Telephone Encounter (Signed)
 Sure

## 2023-09-26 NOTE — Telephone Encounter (Signed)
 Patient stated she had seen Dr. Ladona in the past and is requesting to transition her care from Dr. Michele to Dr. Ladona. Would both of you approve the provider switch?

## 2023-09-26 NOTE — Telephone Encounter (Signed)
 Yes, she use follow JG prior to my arrival to [facility].   ST

## 2023-10-09 DIAGNOSIS — G250 Essential tremor: Secondary | ICD-10-CM | POA: Diagnosis not present

## 2023-10-09 DIAGNOSIS — R946 Abnormal results of thyroid function studies: Secondary | ICD-10-CM | POA: Diagnosis not present

## 2023-10-09 DIAGNOSIS — M810 Age-related osteoporosis without current pathological fracture: Secondary | ICD-10-CM | POA: Diagnosis not present

## 2023-10-09 DIAGNOSIS — Z951 Presence of aortocoronary bypass graft: Secondary | ICD-10-CM | POA: Diagnosis not present

## 2023-10-09 DIAGNOSIS — I2510 Atherosclerotic heart disease of native coronary artery without angina pectoris: Secondary | ICD-10-CM | POA: Diagnosis not present

## 2023-10-09 DIAGNOSIS — R7309 Other abnormal glucose: Secondary | ICD-10-CM | POA: Diagnosis not present

## 2023-10-09 DIAGNOSIS — I10 Essential (primary) hypertension: Secondary | ICD-10-CM | POA: Diagnosis not present

## 2023-10-09 DIAGNOSIS — I6529 Occlusion and stenosis of unspecified carotid artery: Secondary | ICD-10-CM | POA: Diagnosis not present

## 2023-10-09 DIAGNOSIS — E785 Hyperlipidemia, unspecified: Secondary | ICD-10-CM | POA: Diagnosis not present

## 2023-10-15 DIAGNOSIS — D0422 Carcinoma in situ of skin of left ear and external auricular canal: Secondary | ICD-10-CM | POA: Diagnosis not present

## 2023-10-16 DIAGNOSIS — G250 Essential tremor: Secondary | ICD-10-CM | POA: Diagnosis not present

## 2023-10-16 DIAGNOSIS — Z0000 Encounter for general adult medical examination without abnormal findings: Secondary | ICD-10-CM | POA: Diagnosis not present

## 2023-10-16 DIAGNOSIS — I2510 Atherosclerotic heart disease of native coronary artery without angina pectoris: Secondary | ICD-10-CM | POA: Diagnosis not present

## 2023-10-16 DIAGNOSIS — E785 Hyperlipidemia, unspecified: Secondary | ICD-10-CM | POA: Diagnosis not present

## 2023-10-16 DIAGNOSIS — E559 Vitamin D deficiency, unspecified: Secondary | ICD-10-CM | POA: Diagnosis not present

## 2023-10-16 DIAGNOSIS — M810 Age-related osteoporosis without current pathological fracture: Secondary | ICD-10-CM | POA: Diagnosis not present

## 2023-10-16 DIAGNOSIS — Z951 Presence of aortocoronary bypass graft: Secondary | ICD-10-CM | POA: Diagnosis not present

## 2023-10-16 DIAGNOSIS — Z23 Encounter for immunization: Secondary | ICD-10-CM | POA: Diagnosis not present

## 2023-10-16 DIAGNOSIS — R053 Chronic cough: Secondary | ICD-10-CM | POA: Diagnosis not present

## 2023-10-16 DIAGNOSIS — I6529 Occlusion and stenosis of unspecified carotid artery: Secondary | ICD-10-CM | POA: Diagnosis not present

## 2023-10-16 DIAGNOSIS — I10 Essential (primary) hypertension: Secondary | ICD-10-CM | POA: Diagnosis not present

## 2023-10-30 DIAGNOSIS — H209 Unspecified iridocyclitis: Secondary | ICD-10-CM | POA: Diagnosis not present

## 2023-10-30 DIAGNOSIS — H35342 Macular cyst, hole, or pseudohole, left eye: Secondary | ICD-10-CM | POA: Diagnosis not present

## 2023-10-30 DIAGNOSIS — H401131 Primary open-angle glaucoma, bilateral, mild stage: Secondary | ICD-10-CM | POA: Diagnosis not present

## 2023-10-30 DIAGNOSIS — H43813 Vitreous degeneration, bilateral: Secondary | ICD-10-CM | POA: Diagnosis not present

## 2023-11-06 DIAGNOSIS — H209 Unspecified iridocyclitis: Secondary | ICD-10-CM | POA: Diagnosis not present

## 2023-11-27 DIAGNOSIS — H209 Unspecified iridocyclitis: Secondary | ICD-10-CM | POA: Diagnosis not present

## 2023-11-27 DIAGNOSIS — H401131 Primary open-angle glaucoma, bilateral, mild stage: Secondary | ICD-10-CM | POA: Diagnosis not present

## 2023-11-27 DIAGNOSIS — Z961 Presence of intraocular lens: Secondary | ICD-10-CM | POA: Diagnosis not present

## 2023-12-05 DIAGNOSIS — Z48817 Encounter for surgical aftercare following surgery on the skin and subcutaneous tissue: Secondary | ICD-10-CM | POA: Diagnosis not present

## 2023-12-18 DIAGNOSIS — M810 Age-related osteoporosis without current pathological fracture: Secondary | ICD-10-CM | POA: Diagnosis not present

## 2024-01-08 DIAGNOSIS — Z961 Presence of intraocular lens: Secondary | ICD-10-CM | POA: Diagnosis not present

## 2024-01-08 DIAGNOSIS — H401131 Primary open-angle glaucoma, bilateral, mild stage: Secondary | ICD-10-CM | POA: Diagnosis not present

## 2024-01-08 DIAGNOSIS — H0102B Squamous blepharitis left eye, upper and lower eyelids: Secondary | ICD-10-CM | POA: Diagnosis not present

## 2024-01-08 DIAGNOSIS — H0102A Squamous blepharitis right eye, upper and lower eyelids: Secondary | ICD-10-CM | POA: Diagnosis not present

## 2024-03-06 ENCOUNTER — Other Ambulatory Visit: Payer: Self-pay | Admitting: Cardiology

## 2024-03-06 DIAGNOSIS — I10 Essential (primary) hypertension: Secondary | ICD-10-CM

## 2024-03-12 ENCOUNTER — Other Ambulatory Visit: Payer: Self-pay | Admitting: Cardiology

## 2024-03-12 DIAGNOSIS — I25118 Atherosclerotic heart disease of native coronary artery with other forms of angina pectoris: Secondary | ICD-10-CM

## 2024-03-12 DIAGNOSIS — I10 Essential (primary) hypertension: Secondary | ICD-10-CM

## 2024-03-13 MED ORDER — LOSARTAN POTASSIUM-HCTZ 50-12.5 MG PO TABS: 1.0000 | ORAL_TABLET | Freq: Every morning | ORAL | 0 refills | Status: AC

## 2024-03-13 NOTE — Telephone Encounter (Signed)
 Valid encounter within last 12 months Cr in normal range and within 365 days K in normal range and within 365 days  Labs 10/09/23 pending 05/23/23 w Dr Ladona

## 2024-04-22 ENCOUNTER — Ambulatory Visit: Admitting: Neurology

## 2024-05-06 ENCOUNTER — Ambulatory Visit: Admitting: Neurology

## 2024-05-22 ENCOUNTER — Ambulatory Visit: Admitting: Cardiology
